# Patient Record
Sex: Female | Born: 1935 | Race: White | Hispanic: No | Smoking: Never smoker
Health system: Southern US, Community
[De-identification: ages and names within clinical notes are randomized; demographics above are authoritative.]

## PROBLEM LIST (undated history)

## (undated) DIAGNOSIS — N3281 Overactive bladder: Secondary | ICD-10-CM

## (undated) DIAGNOSIS — F419 Anxiety disorder, unspecified: Secondary | ICD-10-CM

## (undated) DIAGNOSIS — I10 Essential (primary) hypertension: Secondary | ICD-10-CM

## (undated) DIAGNOSIS — M1460 Charcot's joint, unspecified site: Secondary | ICD-10-CM

## (undated) HISTORY — DX: Overactive bladder: N32.81

---

## 2006-09-12 ENCOUNTER — Ambulatory Visit: Payer: Self-pay | Admitting: Cardiology

## 2007-01-16 ENCOUNTER — Ambulatory Visit: Payer: Self-pay | Admitting: Internal Medicine

## 2007-02-02 ENCOUNTER — Ambulatory Visit: Payer: Self-pay | Admitting: Internal Medicine

## 2007-03-09 ENCOUNTER — Ambulatory Visit: Payer: Self-pay | Admitting: Internal Medicine

## 2007-04-09 ENCOUNTER — Ambulatory Visit: Payer: Self-pay | Admitting: Internal Medicine

## 2007-05-01 ENCOUNTER — Ambulatory Visit: Payer: Self-pay | Admitting: Internal Medicine

## 2007-05-08 ENCOUNTER — Ambulatory Visit: Payer: Self-pay | Admitting: Internal Medicine

## 2007-05-29 ENCOUNTER — Ambulatory Visit: Payer: Self-pay | Admitting: Internal Medicine

## 2007-07-19 ENCOUNTER — Ambulatory Visit: Payer: Self-pay | Admitting: Internal Medicine

## 2007-07-19 DIAGNOSIS — J45909 Unspecified asthma, uncomplicated: Secondary | ICD-10-CM | POA: Insufficient documentation

## 2007-07-19 DIAGNOSIS — J209 Acute bronchitis, unspecified: Secondary | ICD-10-CM | POA: Insufficient documentation

## 2007-07-19 DIAGNOSIS — K219 Gastro-esophageal reflux disease without esophagitis: Secondary | ICD-10-CM | POA: Insufficient documentation

## 2007-07-28 ENCOUNTER — Encounter: Payer: Self-pay | Admitting: Internal Medicine

## 2007-08-06 ENCOUNTER — Ambulatory Visit: Payer: Self-pay | Admitting: Internal Medicine

## 2007-08-21 ENCOUNTER — Ambulatory Visit: Payer: Self-pay | Admitting: Internal Medicine

## 2007-08-31 ENCOUNTER — Telehealth (INDEPENDENT_AMBULATORY_CARE_PROVIDER_SITE_OTHER): Payer: Self-pay | Admitting: *Deleted

## 2007-10-30 ENCOUNTER — Telehealth (INDEPENDENT_AMBULATORY_CARE_PROVIDER_SITE_OTHER): Payer: Self-pay | Admitting: *Deleted

## 2007-12-08 ENCOUNTER — Emergency Department (HOSPITAL_COMMUNITY): Admission: EM | Admit: 2007-12-08 | Discharge: 2007-12-08 | Payer: Self-pay | Admitting: Emergency Medicine

## 2008-07-07 ENCOUNTER — Encounter: Payer: Self-pay | Admitting: Internal Medicine

## 2008-07-08 ENCOUNTER — Telehealth (INDEPENDENT_AMBULATORY_CARE_PROVIDER_SITE_OTHER): Payer: Self-pay | Admitting: *Deleted

## 2008-08-26 ENCOUNTER — Encounter: Payer: Self-pay | Admitting: Internal Medicine

## 2008-08-28 ENCOUNTER — Telehealth (INDEPENDENT_AMBULATORY_CARE_PROVIDER_SITE_OTHER): Payer: Self-pay | Admitting: *Deleted

## 2008-09-09 ENCOUNTER — Ambulatory Visit: Payer: Self-pay | Admitting: Internal Medicine

## 2008-09-23 ENCOUNTER — Ambulatory Visit: Payer: Self-pay | Admitting: Internal Medicine

## 2008-09-23 ENCOUNTER — Telehealth (INDEPENDENT_AMBULATORY_CARE_PROVIDER_SITE_OTHER): Payer: Self-pay | Admitting: *Deleted

## 2008-09-25 ENCOUNTER — Encounter: Payer: Self-pay | Admitting: Adult Health

## 2008-09-30 ENCOUNTER — Encounter: Payer: Self-pay | Admitting: Adult Health

## 2008-11-12 ENCOUNTER — Encounter: Payer: Self-pay | Admitting: Adult Health

## 2008-12-08 ENCOUNTER — Ambulatory Visit: Payer: Self-pay | Admitting: Internal Medicine

## 2009-01-09 ENCOUNTER — Inpatient Hospital Stay (HOSPITAL_COMMUNITY): Admission: EM | Admit: 2009-01-09 | Discharge: 2009-01-15 | Payer: Self-pay | Admitting: Emergency Medicine

## 2009-01-12 ENCOUNTER — Encounter: Payer: Self-pay | Admitting: Gastroenterology

## 2009-01-13 ENCOUNTER — Encounter: Payer: Self-pay | Admitting: Gastroenterology

## 2009-01-15 ENCOUNTER — Ambulatory Visit: Payer: Self-pay | Admitting: Gastroenterology

## 2009-03-28 ENCOUNTER — Emergency Department (HOSPITAL_COMMUNITY): Admission: EM | Admit: 2009-03-28 | Discharge: 2009-03-28 | Payer: Self-pay | Admitting: Emergency Medicine

## 2009-04-28 ENCOUNTER — Encounter: Payer: Self-pay | Admitting: Adult Health

## 2009-05-07 ENCOUNTER — Encounter: Payer: Self-pay | Admitting: Internal Medicine

## 2009-05-28 ENCOUNTER — Ambulatory Visit: Payer: Self-pay | Admitting: Internal Medicine

## 2009-06-23 ENCOUNTER — Encounter: Payer: Self-pay | Admitting: Internal Medicine

## 2009-07-09 ENCOUNTER — Telehealth (INDEPENDENT_AMBULATORY_CARE_PROVIDER_SITE_OTHER): Payer: Self-pay | Admitting: *Deleted

## 2009-07-10 ENCOUNTER — Encounter: Payer: Self-pay | Admitting: Internal Medicine

## 2009-07-17 ENCOUNTER — Telehealth (INDEPENDENT_AMBULATORY_CARE_PROVIDER_SITE_OTHER): Payer: Self-pay | Admitting: *Deleted

## 2009-07-17 ENCOUNTER — Encounter: Payer: Self-pay | Admitting: Internal Medicine

## 2009-10-08 ENCOUNTER — Encounter: Payer: Self-pay | Admitting: Adult Health

## 2009-10-13 ENCOUNTER — Encounter: Payer: Self-pay | Admitting: Adult Health

## 2009-10-13 ENCOUNTER — Telehealth (INDEPENDENT_AMBULATORY_CARE_PROVIDER_SITE_OTHER): Payer: Self-pay | Admitting: *Deleted

## 2009-10-16 ENCOUNTER — Encounter: Payer: Self-pay | Admitting: Adult Health

## 2009-10-28 ENCOUNTER — Telehealth: Payer: Self-pay | Admitting: Internal Medicine

## 2009-10-28 ENCOUNTER — Emergency Department (HOSPITAL_COMMUNITY): Admission: EM | Admit: 2009-10-28 | Discharge: 2009-10-28 | Payer: Self-pay | Admitting: Emergency Medicine

## 2010-03-09 ENCOUNTER — Inpatient Hospital Stay (HOSPITAL_COMMUNITY): Admission: EM | Admit: 2010-03-09 | Discharge: 2010-03-12 | Payer: Self-pay | Admitting: Emergency Medicine

## 2010-03-09 ENCOUNTER — Ambulatory Visit: Payer: Self-pay | Admitting: Cardiovascular Disease

## 2010-03-10 ENCOUNTER — Encounter (INDEPENDENT_AMBULATORY_CARE_PROVIDER_SITE_OTHER): Payer: Self-pay | Admitting: Internal Medicine

## 2010-03-10 ENCOUNTER — Ambulatory Visit: Payer: Self-pay | Admitting: Vascular Surgery

## 2010-03-11 ENCOUNTER — Encounter (INDEPENDENT_AMBULATORY_CARE_PROVIDER_SITE_OTHER): Payer: Self-pay | Admitting: Internal Medicine

## 2010-03-12 ENCOUNTER — Encounter (INDEPENDENT_AMBULATORY_CARE_PROVIDER_SITE_OTHER): Payer: Self-pay | Admitting: Internal Medicine

## 2010-07-27 NOTE — Procedures (Signed)
Summary: Pulse Oximetry/IDS  Pulse Oximetry/IDS   Imported By: Sherian Rein 07/24/2009 14:59:34  _____________________________________________________________________  External Attachment:    Type:   Image     Comment:   External Document

## 2010-07-27 NOTE — Medication Information (Signed)
Summary: Albuterol & Brovana/[facility]  Albuterol & Brovana/[facility]   Imported By: Sherian Rein 10/15/2009 11:11:49  _____________________________________________________________________  External Attachment:    Type:   Image     Comment:   External Document

## 2010-07-27 NOTE — Medication Information (Signed)
Summary: Albuterol & Brovana/[facility]  Albuterol & Brovana/[facility]   Imported By: Sherian Rein 10/13/2009 10:18:05  _____________________________________________________________________  External Attachment:    Type:   Image     Comment:   External Document

## 2010-07-27 NOTE — Medication Information (Signed)
Summary: Budesonide/[facility]  Budesonide/[facility]   Imported By: Sherian Rein 10/13/2009 10:16:39  _____________________________________________________________________  External Attachment:    Type:   Image     Comment:   External Document

## 2010-07-27 NOTE — Progress Notes (Signed)
Summary: going to ER for SOB  Phone Note Call from Patient Call back at [phone]   Caller: Patient Call For: Osie Amparo Summary of Call: pt at [facility] long emergencyroom  for breathing problem Initial call taken by: Rickard Patience,  Oct 28, 2009 3:18 PM  Follow-up for Phone Call        called spoke with patient's son who stated that pt is already at the ER and that he is on his way.  advised that the ER staff will notify our physician(s) on call and she will be rounded on by someone from the office.  son verbalized his understanding.  will forward to MW as Lorain Childes. Follow-up by: Boone Master CNA,  Oct 28, 2009 3:35 PM  Additional Follow-up for Phone Call Additional follow up Details #1::        aware Additional Follow-up by: Nyoka Cowden MD,  Oct 28, 2009 3:45 PM

## 2010-07-27 NOTE — Procedures (Signed)
Summary: Order for Overnight Pulse Oximetry/IDS  Order for Overnight Pulse Oximetry/IDS   Imported By: Sherian Rein 10/15/2009 11:10:10  _____________________________________________________________________  External Attachment:    Type:   Image     Comment:   External Document

## 2010-07-27 NOTE — Miscellaneous (Signed)
Summary: Orders Update  Clinical Lists Changes  Orders: Added new Referral order of DME Referral (DME) - Signed 

## 2010-07-27 NOTE — Procedures (Signed)
Summary: Order ONO/IDS  Order ONO/IDS   Imported By: Lester [place] 06/30/2009 09:51:49  _____________________________________________________________________  External Attachment:    Type:   Image     Comment:   External Document

## 2010-07-27 NOTE — Procedures (Signed)
Summary: ONO/[facility]  ONO/[facility]   Imported By: Lester [place] 06/30/2009 10:02:58  _____________________________________________________________________  External Attachment:    Type:   Image     Comment:   External Document

## 2010-07-27 NOTE — Procedures (Signed)
Summary: Pulse Oximetry/IDS  Pulse Oximetry/IDS   Imported By: Sherian Rein 10/26/2009 08:07:35  _____________________________________________________________________  External Attachment:    Type:   Image     Comment:   External Document

## 2010-07-27 NOTE — Progress Notes (Signed)
Summary: PRESCRIPT  Phone Note Call from Patient   Caller: Spouse Call For: PARRETT Summary of Call: PT RECEIVED A LETTER FROM [employer] ABOUT AVAPRO  Initial call taken by: Rickard Patience,  July 09, 2009 10:08 AM  Follow-up for Phone Call        called spoke with pt's spouse.  he states they received a letter from ins company stating pt's avapro is no longer covered w/o PA.  asked pt's husband if he would rather [place] initiate a PA which may take up to 2 weeks or for [place] to go ahead and find another medication to switch pt to.  he stated that she prefers to stay on the avapro.  informed Mr. Littleton that i will call their pharmacy to get them to fax [place] the information so that we may begin the PA process.  pt's husband verbalized his understanding.  pt was given a 30day supply of her medication until this is taken care per her husband.  called pharmacy.  was told they will fax [place] the appropriate info for the PA.  will sign off on message and await the fax.  Follow-up by: Boone Master CNA,  July 09, 2009 10:38 AM

## 2010-07-27 NOTE — Progress Notes (Signed)
Summary: medication  Phone Note From Pharmacy Call back at [phone] ex [phone]   Caller: [employer]  sonya Call For: wert  Summary of Call: need to know if avapro has been changed to diovan Initial call taken by: Rickard Patience,  July 17, 2009 9:34 AM  Follow-up for Phone Call        Done. Spoke with pt and made aware that rx for avapro was switched to diovan. Follow-up by: Vernie Murders,  July 17, 2009 9:50 AM

## 2010-07-27 NOTE — Progress Notes (Signed)
Summary: med directions  Phone Note From Pharmacy Call back at [phone] ex [phone]   Caller: [facility]  ( [facility] Call For: parrett  Summary of Call: have questions about albuterol directions Initial call taken by: Rickard Patience,  October 13, 2009 11:18 AM  Follow-up for Phone Call        Spoke with Halina Maidens from [facility].  They received Tammy's rx for pt's Albuterol ( see scanned document on 10-08-2009) However, order for Albuterol was changed from 1 daily as needed to 1 vial in nebulizer every 4 hours as needed for wheezing.  Halina Maidens states since pt is on Brovana two times a day her insurance company will only cover # 30 albuterol per month.  OK'd directions for albuterol to switch back to 1 vial once daily as needed.  Aundra Millet Reynolds LPN  October 13, 2009 11:35 AM   updated pt's med list.    New/Updated Medications: ALBUTEROL SULFATE (2.5 MG/3ML) 0.083% NEBU (ALBUTEROL SULFATE) 1 vial in nebulizer once daily as needed for wheezing

## 2010-09-09 LAB — URINALYSIS, ROUTINE W REFLEX MICROSCOPIC
Glucose, UA: NEGATIVE mg/dL
Ketones, ur: 40 mg/dL — AB
Leukocytes, UA: NEGATIVE
Nitrite: NEGATIVE
Protein, ur: 30 mg/dL — AB
Urobilinogen, UA: 1 mg/dL (ref 0.0–1.0)

## 2010-09-09 LAB — CBC
HCT: 37.5 % (ref 36.0–46.0)
MCH: 29.8 pg (ref 26.0–34.0)
MCV: 90.8 fL (ref 78.0–100.0)
Platelets: 221 10*3/uL (ref 150–400)
RDW: 12.8 % (ref 11.5–15.5)

## 2010-09-09 LAB — HEMOGLOBIN A1C
Hgb A1c MFr Bld: 5.2 % (ref <5.7)
Mean Plasma Glucose: 103 mg/dL (ref <117)

## 2010-09-09 LAB — BASIC METABOLIC PANEL
BUN: 18 mg/dL (ref 6–23)
BUN: 20 mg/dL (ref 6–23)
CO2: 24 mEq/L (ref 19–32)
CO2: 25 mEq/L (ref 19–32)
Chloride: 105 mEq/L (ref 96–112)
Chloride: 113 mEq/L — ABNORMAL HIGH (ref 96–112)
Creatinine, Ser: 1.05 mg/dL (ref 0.4–1.2)
Glucose, Bld: 102 mg/dL — ABNORMAL HIGH (ref 70–99)
Glucose, Bld: 95 mg/dL (ref 70–99)
Potassium: 3.2 mEq/L — ABNORMAL LOW (ref 3.5–5.1)

## 2010-09-09 LAB — DIFFERENTIAL
Basophils Absolute: 0 10*3/uL (ref 0.0–0.1)
Basophils Relative: 0 % (ref 0–1)
Lymphocytes Relative: 9 % — ABNORMAL LOW (ref 12–46)
Monocytes Absolute: 0.2 10*3/uL (ref 0.1–1.0)
Neutro Abs: 6.8 10*3/uL (ref 1.7–7.7)
Neutrophils Relative %: 88 % — ABNORMAL HIGH (ref 43–77)

## 2010-09-09 LAB — URINE MICROSCOPIC-ADD ON

## 2010-09-09 LAB — COMPREHENSIVE METABOLIC PANEL
Albumin: 3.6 g/dL (ref 3.5–5.2)
BUN: 17 mg/dL (ref 6–23)
Creatinine, Ser: 1.1 mg/dL (ref 0.4–1.2)
Glucose, Bld: 108 mg/dL — ABNORMAL HIGH (ref 70–99)
Total Bilirubin: 0.7 mg/dL (ref 0.3–1.2)
Total Protein: 6.2 g/dL (ref 6.0–8.3)

## 2010-09-09 LAB — TROPONIN I: Troponin I: 0.01 ng/mL (ref 0.00–0.06)

## 2010-09-09 LAB — CK TOTAL AND CKMB (NOT AT ARMC)
CK, MB: 6.9 ng/mL (ref 0.3–4.0)
Relative Index: 3.5 — ABNORMAL HIGH (ref 0.0–2.5)

## 2010-09-09 LAB — APTT: aPTT: 26 seconds (ref 24–37)

## 2010-09-09 LAB — LACTIC ACID, PLASMA: Lactic Acid, Venous: 1.2 mmol/L (ref 0.5–2.2)

## 2010-09-09 LAB — PROTIME-INR: INR: 0.97 (ref 0.00–1.49)

## 2010-09-09 LAB — LIPID PANEL: VLDL: 13 mg/dL (ref 0–40)

## 2010-09-14 LAB — DIFFERENTIAL
Basophils Relative: 0 % (ref 0–1)
Eosinophils Absolute: 0.2 10*3/uL (ref 0.0–0.7)
Lymphs Abs: 0.3 10*3/uL — ABNORMAL LOW (ref 0.7–4.0)
Monocytes Absolute: 0.1 10*3/uL (ref 0.1–1.0)
Monocytes Relative: 1 % — ABNORMAL LOW (ref 3–12)
Neutrophils Relative %: 91 % — ABNORMAL HIGH (ref 43–77)

## 2010-09-14 LAB — BASIC METABOLIC PANEL
CO2: 28 mEq/L (ref 19–32)
Chloride: 106 mEq/L (ref 96–112)
Creatinine, Ser: 1.07 mg/dL (ref 0.4–1.2)
GFR calc Af Amer: 59 mL/min — ABNORMAL LOW (ref >60)
Potassium: 3.8 mEq/L (ref 3.5–5.1)
Sodium: 140 mEq/L (ref 135–145)

## 2010-09-14 LAB — CBC
Hemoglobin: 12.8 g/dL (ref 12.0–15.0)
MCHC: 33.7 g/dL (ref 30.0–36.0)
MCV: 88.2 fL (ref 78.0–100.0)
RBC: 4.29 MIL/uL (ref 3.87–5.11)
WBC: 7.1 10*3/uL (ref 4.0–10.5)

## 2010-10-03 LAB — DIFFERENTIAL
Blasts: 0 %
Eosinophils Absolute: 0.2 10*3/uL (ref 0.0–0.7)
Eosinophils Relative: 2 % (ref 0–5)
Lymphocytes Relative: 7 % — ABNORMAL LOW (ref 12–46)
Lymphs Abs: 0.9 10*3/uL (ref 0.7–4.0)
Metamyelocytes Relative: 0 %
Monocytes Absolute: 0.4 10*3/uL (ref 0.1–1.0)
Monocytes Relative: 3 % (ref 3–12)
Neutro Abs: 10.9 10*3/uL — ABNORMAL HIGH (ref 1.7–7.7)
Neutrophils Relative %: 88 % — ABNORMAL HIGH (ref 43–77)
nRBC: 0 /100 WBC

## 2010-10-03 LAB — BASIC METABOLIC PANEL
BUN: 11 mg/dL (ref 6–23)
BUN: 15 mg/dL (ref 6–23)
BUN: 15 mg/dL (ref 6–23)
BUN: 32 mg/dL — ABNORMAL HIGH (ref 6–23)
CO2: 22 mEq/L (ref 19–32)
CO2: 23 mEq/L (ref 19–32)
CO2: 24 mEq/L (ref 19–32)
CO2: 26 mEq/L (ref 19–32)
Calcium: 7.9 mg/dL — ABNORMAL LOW (ref 8.4–10.5)
Calcium: 8 mg/dL — ABNORMAL LOW (ref 8.4–10.5)
Chloride: 108 mEq/L (ref 96–112)
Chloride: 113 mEq/L — ABNORMAL HIGH (ref 96–112)
Chloride: 113 mEq/L — ABNORMAL HIGH (ref 96–112)
Creatinine, Ser: 1.21 mg/dL — ABNORMAL HIGH (ref 0.4–1.2)
Creatinine, Ser: 1.63 mg/dL — ABNORMAL HIGH (ref 0.4–1.2)
GFR calc Af Amer: 37 mL/min — ABNORMAL LOW (ref >60)
GFR calc Af Amer: 52 mL/min — ABNORMAL LOW (ref >60)
GFR calc non Af Amer: 44 mL/min — ABNORMAL LOW (ref >60)
Glucose, Bld: 85 mg/dL (ref 70–99)
Glucose, Bld: 87 mg/dL (ref 70–99)
Glucose, Bld: 87 mg/dL (ref 70–99)
Glucose, Bld: 95 mg/dL (ref 70–99)
Potassium: 3.4 mEq/L — ABNORMAL LOW (ref 3.5–5.1)
Potassium: 3.7 mEq/L (ref 3.5–5.1)
Potassium: 4.3 mEq/L (ref 3.5–5.1)
Sodium: 142 mEq/L (ref 135–145)

## 2010-10-03 LAB — URINE CULTURE

## 2010-10-03 LAB — ABO/RH: ABO/RH(D): A POS

## 2010-10-03 LAB — PROTIME-INR
INR: 1.2 (ref 0.00–1.49)
Prothrombin Time: 15.6 seconds — ABNORMAL HIGH (ref 11.6–15.2)

## 2010-10-03 LAB — CBC
HCT: 23.1 % — ABNORMAL LOW (ref 36.0–46.0)
HCT: 25.3 % — ABNORMAL LOW (ref 36.0–46.0)
HCT: 27.2 % — ABNORMAL LOW (ref 36.0–46.0)
HCT: 28.1 % — ABNORMAL LOW (ref 36.0–46.0)
Hemoglobin: 7.4 g/dL — CL (ref 12.0–15.0)
Hemoglobin: 8.4 g/dL — ABNORMAL LOW (ref 12.0–15.0)
MCHC: 32.9 g/dL (ref 30.0–36.0)
MCHC: 33 g/dL (ref 30.0–36.0)
MCHC: 33.1 g/dL (ref 30.0–36.0)
MCV: 72.8 fL — ABNORMAL LOW (ref 78.0–100.0)
MCV: 73 fL — ABNORMAL LOW (ref 78.0–100.0)
MCV: 73.3 fL — ABNORMAL LOW (ref 78.0–100.0)
Platelets: 132 10*3/uL — ABNORMAL LOW (ref 150–400)
Platelets: 143 10*3/uL — ABNORMAL LOW (ref 150–400)
Platelets: 152 10*3/uL (ref 150–400)
Platelets: 177 10*3/uL (ref 150–400)
RBC: 3.37 MIL/uL — ABNORMAL LOW (ref 3.87–5.11)
RBC: 3.67 MIL/uL — ABNORMAL LOW (ref 3.87–5.11)
RBC: 3.91 MIL/uL (ref 3.87–5.11)
RDW: 18.2 % — ABNORMAL HIGH (ref 11.5–15.5)
RDW: 21.1 % — ABNORMAL HIGH (ref 11.5–15.5)
RDW: 21.4 % — ABNORMAL HIGH (ref 11.5–15.5)
RDW: 21.7 % — ABNORMAL HIGH (ref 11.5–15.5)
RDW: 22.9 % — ABNORMAL HIGH (ref 11.5–15.5)
WBC: 9.5 10*3/uL (ref 4.0–10.5)

## 2010-10-03 LAB — CROSSMATCH: Antibody Screen: NEGATIVE

## 2010-10-03 LAB — RETICULOCYTES
RBC.: 3.44 MIL/uL — ABNORMAL LOW (ref 3.87–5.11)
Retic Count, Absolute: 20.6 10*3/uL (ref 19.0–186.0)
Retic Ct Pct: 0.6 % (ref 0.4–3.1)

## 2010-10-03 LAB — UIFE/LIGHT CHAINS/TP QN, 24-HR UR
Alpha 1, Urine: DETECTED — AB
Alpha 2, Urine: DETECTED — AB
Alpha 2, Urine: DETECTED — AB
Beta, Urine: DETECTED — AB
Free Kappa Lt Chains,Ur: 12.2 mg/dL — ABNORMAL HIGH (ref 0.04–1.51)
Free Kappa/Lambda Ratio: 2.8 ratio (ref 0.46–4.00)
Free Lambda Lt Chains,Ur: 4.11 mg/dL — ABNORMAL HIGH (ref 0.08–1.01)
Free Lt Chn Excr Rate: 161 mg/d
Gamma Globulin, Urine: DETECTED — AB
Total Protein, Urine-Ur/day: 374 mg/d — ABNORMAL HIGH (ref 10–140)
Total Protein, Urine: 26.7 mg/dL
Volume, Urine: 1400 mL

## 2010-10-03 LAB — POCT I-STAT, CHEM 8
Calcium, Ion: 1.14 mmol/L (ref 1.12–1.32)
Creatinine, Ser: 2.1 mg/dL — ABNORMAL HIGH (ref 0.4–1.2)
Glucose, Bld: 80 mg/dL (ref 70–99)
HCT: 24 % — ABNORMAL LOW (ref 36.0–46.0)
Hemoglobin: 8.2 g/dL — ABNORMAL LOW (ref 12.0–15.0)
Potassium: 3.9 mEq/L (ref 3.5–5.1)
TCO2: 22 mmol/L (ref 0–100)

## 2010-10-03 LAB — URINALYSIS, ROUTINE W REFLEX MICROSCOPIC
Hgb urine dipstick: NEGATIVE
Nitrite: NEGATIVE
Specific Gravity, Urine: 1.019 (ref 1.005–1.030)
pH: 5.5 (ref 5.0–8.0)

## 2010-10-03 LAB — FERRITIN: Ferritin: 25 ng/mL (ref 10–291)

## 2010-10-03 LAB — RPR: RPR Ser Ql: NONREACTIVE

## 2010-10-03 LAB — HEPATIC FUNCTION PANEL
ALT: 22 U/L (ref 0–35)
AST: 25 U/L (ref 0–37)
Albumin: 2.4 g/dL — ABNORMAL LOW (ref 3.5–5.2)
Total Bilirubin: 0.4 mg/dL (ref 0.3–1.2)

## 2010-10-03 LAB — LIPID PANEL
Cholesterol: 89 mg/dL (ref 0–200)
LDL Cholesterol: 41 mg/dL (ref 0–99)
Triglycerides: 106 mg/dL (ref <150)

## 2010-10-03 LAB — URINE MICROSCOPIC-ADD ON

## 2010-10-03 LAB — T4, FREE: Free T4: 0.95 ng/dL (ref 0.80–1.80)

## 2010-10-03 LAB — CARDIAC PANEL(CRET KIN+CKTOT+MB+TROPI)
CK, MB: 11.2 ng/mL — ABNORMAL HIGH (ref 0.3–4.0)
Relative Index: INVALID (ref 0.0–2.5)
Total CK: 58 U/L (ref 7–177)

## 2010-10-03 LAB — IMMUNOFIXATION ELECTROPHORESIS
IgM, Serum: 68 mg/dL (ref 60–263)
Total Protein ELP: 4.3 g/dL — ABNORMAL LOW (ref 6.0–8.3)

## 2010-10-03 LAB — SEDIMENTATION RATE: Sed Rate: 48 mm/hr — ABNORMAL HIGH (ref 0–22)

## 2010-10-03 LAB — MAGNESIUM: Magnesium: 2.3 mg/dL (ref 1.5–2.5)

## 2010-10-03 LAB — FOLATE: Folate: >20 ng/mL

## 2010-10-03 LAB — OSMOLALITY: Osmolality: 291 mOsm/kg (ref 275–300)

## 2010-10-03 LAB — TSH: TSH: 1.283 u[IU]/mL (ref 0.350–4.500)

## 2010-11-09 NOTE — Assessment & Plan Note (Signed)
HEALTHCARE                             PULMONARY OFFICE NOTE   [name], [name]                      MRN:          [id number]  DATE:02/02/2007                            DOB:          [birth year]    This is a most pleasant 77 year old white female, seen initially on July  22 for dyspnea associated with hoarseness, but had not responded to  prednisone, and I thought was probably related to irritation from  inhalers plus reflux.  I asked her to stop all of her inhalers, stop  Boniva (which potentially could contribute to reflux), and treated her  aggressively for reflux while using DuoNeb q.6 h. p.r.n.  She found  within 3 days she was better and has not used DuoNeb since.  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   PHYSICAL EXAMINATION:  She is a pleasant, ambulatory white female in no  acute distress with very weathered facies.  She is afebrile with normal vital signs.  HEENT:  Unremarkable.  Oropharynx is clear.  There is no evidence of  excessive postnasal drainage or cobblestoning.  NECK:  Supple without cervical adenopathy or tenderness.  Trachea is  midline, no thyromegaly.  Lung fields are perfectly clear bilaterally to auscultation and  percussion.  There was no cough elicited on inspiratory or expiratory  maneuvers.  There was mild kyphosis present.  HEART:  Regular rhythm without murmurs, gallops, or rubs or increase in  P2.  ABDOMEN:  Soft, benign.  EXTREMITIES:  Warm without calf tenderness, cyanosis, clubbing, or  edema.   Chest x-ray was reviewed from her last visit and suggested emphysema.   IMPRESSION:  Although she carries a diagnosis of chronic obstructive  pulmonary disease, and on chest x-ray it has emphysema, she says she has  never smoked but been around it all of her life, except for the last 3  years.  I suspect she does, indeed, have some  chronic obstructive  pulmonary disease , but most of her symptoms were upper and not lower  airway in nature.  Unfortunately, most medications for chronic  obstructive pulmonary disease can aggravate the upper airway, either  directly or by contributing to reflux.   I, therefore, recommended the following:  1. For now she should stay off Boniva, although long term,  we will      need to either rechallenge her with [facility] or consider an      alternative agent for the treatment of osteoporosis, should, on      rechallenge, we find that every time the Sandrea Hammond is started, she has      a COPD flare.  2. Continue b.i.d. dosing of her Protonix for at least the next 4      weeks before returning here for PFTs to answer the question whether      or not she has significant functional abnormalities to warrant more      aggressive long-term treatment.  3. Stop Singular since she is not convinced it  helped and is already      on an extensive and expensive regimen.     [name]. Alexandra Sires, MD, [facility]  Electronically Signed    MBW/MedQ  DD: 02/02/2007  DT: 02/03/2007  Job #: [id number]   cc:   [name]

## 2010-11-09 NOTE — Assessment & Plan Note (Signed)
[facility]                             PULMONARY OFFICE NOTE   MADASON, RAULS                      MRN:          [id number]  DATE:05/29/2007                            DOB:          07-14-1924    [name] is an 77 year old white female who returns all smiles  today feeling the best she has felt in months.  She is tapering off of  prednisone.  Has been taking Symbicort 80/4.5 2 puffs b.i.d. with no  need for rescue therapy and tolerating normal activities of daily living  as well as sleeping well at night with no coughing or wheezing.   I have reviewed her medications with her and using a medication calendar  format that was generated by our nurse practitioner and provides a user  friendly unambiguous self-administration medication record that she has  been following to the letter.   PHYSICAL EXAMINATION:  GENERAL:  She is a pleasant ambulatory white  female with weathered facies.  VITAL SIGNS:  Stable.  HEENT:  Unremarkable.  Oropharynx clear.  LUNGS:  Lung fields are perfectly clear bilaterally to auscultation and  percussion.  There is regular rhythm without murmur, gallop or rub.  ABDOMEN:  Soft, benign.  EXTREMITIES:  Warm without calf tenderness, cyanosis, clubbing or edema.   LABORATORY DATA:  Heme saturation 98% on room air.   IMPRESSION:  This patient has features of chronic asthma of relatively  late onset in life typical of a patient who might be refluxing.  This is  the one change in her regimen that had not been tried before, and I have  asked therefore to continue the Zegerid for now, and unless she is  completely staying free of wheezing and free of exacerbations off of  systemic steroids I would not stop the Zegerid.   It is critical that she understand how to use Symbicort effectively, and  I spent extra time making sure she could self-administer this  effectively.  If she flares off of prednisone I would like to see  her  back here.  Otherwise, follow up and refills can certainly be through  Dr. Waynard Reeds office.     [name]. [name], MD, [facility]  Electronically Signed    MBW/MedQ  DD: 05/29/2007  DT: 05/30/2007  Job #: [id number]   cc:   Dr. Virgina Organ

## 2010-11-09 NOTE — Assessment & Plan Note (Signed)
[facility]                             PULMONARY OFFICE NOTE   [name], [name]                      MRN:          [id number]  DATE:05/01/2007                            DOB:          [birth year]    HISTORY:  An 77 year old white female with a history of what I  considered intermittent asthma, but never smoker with a history of  intermittent asthma with a baseline FEV1 of 85% predicted documented in  September of 2008.  However, she has been having more bad days than good  days in the last 4 weeks, and comes in today while on a tapering course  of prednisone with another flare-up of increasing dyspnea with minimal  activity, as well as nocturnal spells which require albuterol and  Atrovent per nebulizer (at baseline she has no difficulty with dyspnea  or any need for nebulizer therapy.  She was given Symbicort by Dr.  Virgina Organ on April 26, 2007 and states she is no better yet.  She  denies any fevers, chills, sweats, orthopnea, PND, leg swelling or chest  pain.   MEDICATIONS:  For full inventory of medications, please see face sheet  dated May 01, 2007, noting that she continues to use Zegerid 40 mg  at bedtime.   PHYSICAL EXAMINATION:  GENERAL:  She is an anxious but pleasant  ambulatory white female with weathered facies.  VITAL SIGNS:  Stable.  HEENT:  Unremarkable.  Oropharynx clear.  LUNGS:  Lung fields reveal inspiratory and expiratory wheezes and  rhonchi bilaterally with poor air movement.  Regular rhythm without  murmur, gallop, or rub.  ABDOMEN:  Soft, benign.  EXTREMITIES:  Warm without calf tenderness.  No clubbing, cyanosis, or  edema.   IMPRESSION:  This patient has much more chronic asthma than I previously  appreciated.  It may well be that the reason it is not obvious is that  she over-uses her home nebulizer, and also is frequently receiving  courses of prednisone, which make her look better than she really is.  Today, even on prednisone, she really has very poor airways control, and  therefore I recommended the following:  1. First I spent extra time making sure that she could use M.D.I.      effectively, and to my surprise I could only coach her to about 25%      effectiveness.  She wants to give it a try, so I am going to ask      her to continue Symbicort at the course of 160/4.5 two puffs b.i.d.      for the next week and give her prednisone 10 mg tablets to take 2      q.a.m. until 100% better, and then one q.a.m. thereafter until we      have a chance to make sure that she can use M.D.I. effectively.  If      not, I plan to switch her over to budesonide and Brovana on her      next visit.   In the meantime, I am going to ask her to continue Singulair 10 mg  daily  (although I believe long-term this is probably not going to be needed,  as she is breaking right through the Singulair, as well), and take the  liberty of switching her from Cozaar to Avapro 150 one daily (because  anecdotally I found that Cozaar causes more problems with airways than  any of the other ARBs, which generally are very safe from a pulmonary  perspective).     [name]. [name], MD, [facility]  Electronically Signed    MBW/MedQ  DD: 05/01/2007  DT: 05/02/2007  Job #: [id number]   cc:   Prescott Parma

## 2010-11-09 NOTE — Discharge Summary (Signed)
Alexandra, Montoya             ACCOUNT NO.:  [id number]   MEDICAL RECORD NO.:  [id number]          PATIENT TYPE:  INP   LOCATION:  1411                         FACILITY[facility]  [facility]   PHYSICIAN:  Hillery Aldo, M.D.   DATE OF BIRTH:  08-Mar-1925   DATE OF ADMISSION:  01/09/2009  DATE OF DISCHARGE:                               DISCHARGE SUMMARY   Dictation ended at this point.      Hillery Aldo, M.D.  Electronically Signed     CR/MEDQ  D:  01/13/2009  T:  01/13/2009  Job:  [id number]

## 2010-11-09 NOTE — Consult Note (Signed)
[name], TRAUTMANN NO.:  [id number]   MEDICAL RECORD NO.:  [id number]          PATIENT TYPE:  INP   LOCATION:  1411                         FACILITY:  [facility]   PHYSICIAN:  Marlan Palau, M.D.  DATE OF BIRTH:  01/18/1925   DATE OF CONSULTATION:  01/12/2009  DATE OF DISCHARGE:                                 CONSULTATION   HISTORY OF PRESENT ILLNESS:  Assunta Pupo is an 77 year old left-  handed white female born June 20, 1925 with a history of.  Chronic  gait disturbance that has been present for over 2 years.  The patient  has had worsening of her ability to ambulate over the last week or so  with tendency to fall left.  The patient also reports some discomfort in  the left eye and has had closing of the eyelid on the left, and this has  been interpreted as being ptosis.  The patient was admitted for  evaluation of a possible stroke event, but an MRI scan of the brain  showed no acute stroke with minimal small vessel ischemic changes that  are chronic.  MRI angiogram was relatively unremarkable.  The patient  denies double vision.  Denies new numbness or weakness on the face, arms  or legs.  The patient was noted to have acute renal failure with  improvement in the BUN and creatinine during this hospitalization.  The  patient had evidence of a urinary tract infection on admission.  The  patient has anemia and has been running hemoglobins in the  7.49 to 9  range.  Neurology was asked to see this patient for further evaluation  because of the left-sided ptosis or eye closure.   PAST MEDICAL HISTORY:  1. History of left-sided ptosis versus squinting of the left eye.  2. Gait disorder, acute on chronic.  3. Bilateral cataract surgery.  4. Stress incontinence of the bladder.  5. Hypertension.  6. Minimal small vessel disease by MRI.  7. Acute renal failure.  8. Anemia.  9. Asthma.  10.Degenerative arthritis.  11.Urinary tract infection on this  admission.   The patient does not smoke or drink.   CURRENT MEDICATIONS:  1. Brovana 15 mcg inhaler daily.  2. Dulcolax 10 mg daily.  3. Pulmicort 0.5 mg inhaler every 12 hours.  4. Cipro 250 mg every 8 hours.  5 . Vitamin B-12 at 1000 mcg IM daily.  1. Gabapentin 300 mg twice daily.  2. Benicar 20 mg daily.  3. Protonix 40 mg daily.  4. Tylenol 650 mg every 4 hours if needed.  5. Oxycodone 5 mg every 4 hours if needed.   SOCIAL HISTORY:  This patient is married, lives in the [place], [place] area, is retired.  His three children who are alive and well.   FAMILY MEDICAL HISTORY:  It is notable that mother died with congestive  heart failure.  Father died with dementia.  The patient has two sisters,  and one brother who is still living.  One living sister has had a gait  disorder.  One brother died with cancer.  The  patient had two siblings  that died as infants, one with a diarrheal illness and one with food  poisoning.   REVIEW OF SYSTEMS:  Notable for no recent fevers, chills.  The patient  denies headache or significant neck pain.  The patient denies shortness  of breath, chest pains, abdominal pain, nausea, vomiting.  Does have  some trouble with urinary incontinence.  Denies any numbness or weakness  on the arms or legs, trouble swallowing, double vision.  The patient has  not any blackout episodes or seizures.   PHYSICAL EXAMINATION:  VITALS:  Blood pressure is 168/85, heart rate 72,  respiratory rate 18, temperature afebrile.  GENERAL:  This patient is a fairly well-developed elderly white female  who is alert and cooperative at the time of examination.  HEENT:  Head is atraumatic.  Eyes - pupils are postsurgical, round,  reactive to light.  Disks are flat bilaterally.  NECK:  Supple.  No carotid bruits noted.  RESPIRATORY:  Clear.  CARDIOVASCULAR:  Regular rate and rhythm.  No obvious murmurs or rubs  noted.  EXTREMITIES:  Without significant edema.   NEUROLOGIC:  Cranial nerves as above.  Facial symmetry is present.  The  patient has full extraocular movements.  Visual fields are full.  No  ptosis is seen on primary gaze.  At times, the patient will actively  squint in the left eye down.  With superior gaze, there appears be some  slight divergence of the gaze with exotropia of the right eye, but the  patient does not report double vision.  Patient has good strength in all  fours.  Good symmetric motor tone is noted throughout.  Sensory testing  is intact to pinprick, soft touch, vibratory sensation throughout.  The  patient has good finger-nose-finger, heel-to-shin bilaterally.  Gait was  not tested.  Deep tendon reflexes reveal depressed reflexes bilaterally,  but they are symmetric.  The patient's toes are neutral bilaterally.  Again, no drift to the arms or legs noted.  With arms outstretched for 1  minute and superior deviation of the eyes for 1 minute, there is no  fatigable weakness of the proximal arms or evidence of ptosis or  worsening divergence of gaze noted on either side.   LABORATORY DATA:  Notable for a sodium of 137, potassium 3.4, chloride  of 112, CO2 of 23, glucose 87, BUN of 15, creatinine of 1.17, calcium  7.9.  CEA of 0.6.  Homocystine 19.6.  Urinalysis reveals specific  gravity of 0.019, pH of 5.5, protein 30 mg/dL, 57-84 white cells in the  urine.  RPR nonreactive.  B-12 level of 216.  TSH of 1.283, alkaline  phosphatase of 137, SGOT of 25, SGPT of 22, total protein 5.4, albumin  2.4.  Admission BUN and creatinine are 53 and 2.1 respectively.  White  count of 12.4, hemoglobin of 7.4 on admission, hematocrit of 23.1, MCV  of 68.6, platelets of 147.  INR of 1.2.  Iron levels less than 10.   MRI of the head is as above.   IMPRESSION:  1. Reports of left eye discomfort and squinting of the left eye.  No      true ptosis is seen.  2. Gait disorder, acute on chronic.  3. Minimal small vessel disease by MRI.  4.  Acute renal failure.  5. Iron-deficiency anemia.   This patient complains of left eye discomfort and is squinting the left  eye actively, contracting the eyelid rather than having true ptosis  or  fatigable weakness of the left eye.  The patient reports no double  vision.  I suspect that this patient has an intraocular pathology  causing her to squint, although the patient does not seem to be aware  that she is doing this and does not report true photophobia.  The  patient repetitively denies double vision, although with  superior gaze, there does appear to be some divergence to gaze.  Given  the presentation, I will go on to check acetylcholine receptor  antibodies, check a sed rate.  The patient will need ophthalmologic  evaluation by Dr. Elmer Picker once she is discharged from the hospital.  I  will follow this patient's progress while in-house.      Marlan Palau, M.D.  Electronically Signed     CKW/MEDQ  D:  01/12/2009  T:  01/12/2009  Job:  [id number]   cc:   Delon Sacramento, M.D.  Fax: [phone]   [facility]  [address], [address]

## 2010-11-09 NOTE — Discharge Summary (Signed)
[name], [name]             ACCOUNT NO.:  [id number]   MEDICAL RECORD NO.:  [id number]          PATIENT TYPE:  INP   LOCATION:  1411                         FACILITY:  [facility]   PHYSICIAN:  Zannie Cove, MD     DATE OF BIRTH:  Nov 20, 1924   DATE OF ADMISSION:  01/09/2009  DATE OF DISCHARGE:                               DISCHARGE SUMMARY   PRIMARY CARE PHYSICIAN:  [facility].  The  patient is due to get a new physician there.   Discharge diagnosis, consultations, admission details, procedures and  course as dictated by Dr. Hillery Aldo.   HOSPITAL COURSE:  1. In addition for her recurrent falls, her workup was negative and      thought to be secondary to deconditioning.  She will be discharged      home with a rolling walker and home PT/OT.  2. Left eye ptosis thought to be not a true ptosis by Dr. Anne Hahn from      Neurology.  He recommended follow up with Dr. Elmer Picker of      ophthalmology.  Her ptosis has improved, but still has occasional      left eye ptosis.  3. Microcytic anemia.  Started on ferrous sulfate, has iron      deficiency.  Capsule endoscopy done, results are still pending.      Patient is to be notified of results by [facility].  The EGD and      colonoscopy were unrevealing.  4. Acute renal failure, resolved.  Myeloma workup is negative.      Immunofixation was only polyclonal.  Did not show any evidence of      monoclonal spikes or Bence-Jones protein.  5. Escherichia coli urinary tract infection.  Completed a course of      ciprofloxacin.   FOLLOW UP:  1. Follow up with a new primary care physician at [facility].  2. Dr. Elmer Picker of ophthalmology.  3. [facility], Dr. Arlyce Dice.   CONDITION ON DISCHARGE:  Stable.   DISCHARGE DIET:  Low-sodium diet.   DISCHARGE ACTIVITY:  As tolerated with fall precautions per PT  recommendations.   LABORATORY DATA:  CBC:  Hemoglobin 8.4, hematocrit 25, white count  9.1,  platelets 177.  Chemistry:  Sodium 142, potassium 4.3, chloride 113,  bicarb 22, BUN 11, creatinine 1.1, glucose 95.   DISCHARGE MEDICATIONS:  1. Aspirin 325 mg once a day.  2. Gabapentin 300 b.i.d.  3. Omeprazole 20 mg b.i.d.  4. Budesonide nebs 0.5 mg once in the morning and once at night as      needed.  5. Brovana nebs 15 mcg inhalation once in the morning and at night if      needed.  6. Mucinex DM 1-2 tabs as needed.  7. Albuterol nebs 1-2 puffs every 4 hours as needed.  8. Avapro 150 mg once a day.  9. Ferrous sulfate 325 mg once a day.      Zannie Cove, MD  Electronically Signed     PJ/MEDQ  D:  01/15/2009  T:  01/15/2009  Job:  [id number]   cc:   [facility]  Fax: [phone]   Delon Sacramento, M.D.  Fax: [phone]   Barbette Hair. Arlyce Dice, MD,FACG  [address]  [place]  [place] [address]

## 2010-11-09 NOTE — Assessment & Plan Note (Signed)
[name]                             PULMONARY OFFICE NOTE   [name], [name]                      MRN:          [id number]  DATE:05/08/2007                            DOB:          14-Oct-1924    HISTORY OF PRESENT ILLNESS:  Patient is an 77 year old white female  patient of [name], who has a known history of intermittent asthma in a  never-smoker, presents today for a one-week followup.  Last visit,  patient was recommended to continue her Symbicort, which is a dose of  80/4.5 two puffs twice a day.  She was given prednisone 10 mg two  tablets to take until back to 100%.  Patient was changed over from  Cozaar to Avapro.  Patient returns today, reporting that she is  substantially improved.  Her wheezing has totally resolved and cough is  much better.  She denies any chest pain, shortness of breath, orthopnea,  PND.   Patient has brought her medications in today.  She does still maintain  on prednisone 20 mg.  She reports she is doing very well.   PAST MEDICAL HISTORY:  Reviewed.   CURRENT MEDICATIONS:  Reviewed.   PHYSICAL EXAM:  Patient is a pleasant female in no acute distress.  She is afebrile with stable vital signs.  Her O2 saturation is 94% on  room air.  HEENT:  Unremarkable.  NECK:  Supple without cervical adenopathy, no JVD.  LUNG SOUNDS:  Clear to auscultation without any wheezes or crackles.  CARDIAC:  Regular rate and rhythm.  ABDOMEN:  Soft and nontender.  EXTREMITIES:  Warm without any edema.   IMPRESSION AND PLAN:  1. Chronic asthma.  Patient is much improved on her present regimen.      Patient will continue on Symbicort 80/4.5 two puffs twice daily.      We will decrease prednisone down to 10 mg daily and hold and we      will discontinue Singulair.  She will return back here in two weeks      for followup with [name], or sooner if needed.  2. Complex medication regimen.  Patient's medications were reviewed in     detail.  Patient      education was provided.  A computerized medication calendar was      completed for this patient and reviewed in detail.      Alexandra Oaks, NP  Electronically Signed      [name]. Alexandra Sires, MD, [facility]  Electronically Signed   TP/MedQ  DD: 05/08/2007  DT: 05/09/2007  Job #: [id number] [id number]

## 2010-11-09 NOTE — Assessment & Plan Note (Signed)
[facility]                             PULMONARY OFFICE NOTE   CANYON, WILLOW                      MRN:          [id number]  DATE:03/09/2007                            DOB:          10/15/1924    PULMONARY SUMMARY FINAL OFFICE VISIT:   HISTORY:  Patient is a 77 year old white female, never-smoker, seen on  July 22 for dyspnea and hoarseness that had not responded to prednisone  or various inhalers that I asked her to stop.  I also asked her to stop  Boniva, because I thought she might be refluxing.  She is doing great  now, having only used one nebulizer for the last month with no  significant cough, hoarseness or dyspnea.  She returns with request for  PFTs.  She also denies any nocturnal symptoms.   She is maintaining at this point on Protonix 40 mg q.a.m., but has also  been off Singulair for over a month without any increased symptoms of  rhinitis or asthma.   PHYSICAL EXAMINATION:  She is a very pleasant, ambulatory, white female,  in no acute distress.  She is afebrile, stable vital signs.  HEENT: Significant for oropharynx that is clear and there is no  hoarseness at all on phonation.  She no longer clears her throat during  the interview or exam.  Nasal turbinates are normal with no  cobblestoning of the oropharynx.  LUNG FIELDS:  Perfectly clear bilaterally to auscultation and  percussion.  There is regular rhythm without murmur, gallop or rub.  ABDOMEN:  Soft, benign.  EXTREMITIES:  Warm without calf tenderness, cyanosis, clubbing.   Chest x-ray was reviewed from her last visit and suggests  hyperinflation.  However, PFTs today reveal minimum air flow  obstruction.  However, the minimum air flow obstruction does improve  after bronchodilators.   IMPRESSION:  This patient probably has more chronic asthma than COPD,  given the fact that she has never smoked.  The asthma may, in turn, be  exacerbated by rhinitis, either viral  or allergic, or by reflux.   I note that many of her symptoms may have started after starting Boniva  for osteoporosis and, therefore, I would be cautious that, if Boniva is  restarted, she be monitored carefully for flare-up of her asthma.   Because she does have reversible air flow obstruction, she does indeed  have asthma and, rather than use p.r.n. albuterol, I taught her to use  Symbicort, which I have recommended 80/4.5 two puffs b.i.d. p.r.n.  This  is an unusual way to use Symbicort and it is not approved in this  [place] for this purpose, but is used in [place] all the time this way  and I think is ideal for a patient like Ms. Ramnath.   One issue, however, with Symbicort, is it is only available in an [facility]  format, which is easy on the throat, but hard to use, if you are not  used to using push-and-breathe inhalers.  Therefore, if this is not  satisfactory, I think one option might be to use formoterol/budesonide  in a  nebulizer, which can be approved through Part B Medicare.   In terms of treating her long-term for acid reflux, I suspect that, if  she were re-challenged with bisphosphonates, the reflux might become  more of an issue.  I am going to defer this to Dr. Rollen Sox capable  hands, but I do note there is a new IV biphosphonate that can be given  once a year without any GI effects at all, if this issue becomes  problematic in the future.     [name]. [name], MD, [facility]  Electronically Signed    MBW/MedQ  DD: 03/09/2007  DT: 03/09/2007  Job #: [id number]   cc:   [place]

## 2010-11-09 NOTE — H&P (Signed)
[name], [name] NO.:  [id number]   MEDICAL RECORD NO.:  [id number]          PATIENT TYPE:  INP   LOCATION:  0103                         FACILITY:  [facility]   PHYSICIAN:  Vania Rea, M.D. DATE OF BIRTH:  [birth year]   DATE OF ADMISSION:  01/09/2009  DATE OF DISCHARGE:                              HISTORY & PHYSICAL   .   PRIMARY CARE PHYSICIAN:  [facility].   PULMONOLOGIST:  [name]. [name], MD, FCCP.   CHIEF COMPLAINT:  Weakness of the left eyelid and frequent falls worse  for the past 3 days.   HISTORY OF PRESENT ILLNESS:  This is an 77 year old Caucasian lady with  a history of asthma, hypertension, arthritis and neuropathy who has been  having recurrent falls for the past 6 months, according to her husband,  but reports twitching and weakness of her left eye and increasing and  recurrent falls for the past 3 days.  The patient says she tends to fall  to the left.  The patient was brought to the emergency room to be  evaluated for this, and initial blood work found the patient to be  severely anemic and in renal failure. The hospitalist service was called  to assist with management.   The patient denies ever being told that she has anemia or renal failure.  Says she did blood tests as recently as 1 month ago, and her home  medications include Avapro for hypertension.   She denies regular use of BC powders or over-the-counter NSAIDS.  She  has taken 2 tablets of Aleve in the past week for arthritis, but she  says this is the first time.  She denies use of any other in NSAIDs  except daily 325 mg of aspirin.  She denies nausea, vomiting, or passage  of any bloody or melenic stool.  She denies diarrhea or constipation.  She does admit to a loss of 30 pounds over the past year but says she  has regained 19 pounds of it back.  She describes her appetite is good.   Apart from arthritis, the patient does suffer with urinary  incontinence  which her husband reports has been going on for about 10 years.   She denies fever, cough or cold, chest pains or shortness of breath.   PAST MEDICAL HISTORY:  Asthma, hypertension, arthritis, neuropathy.   MEDICATIONS:  The patient is unsure of the exact medication and dosage,  but they seem to include omeprazole, Avapro, gabapentin, Ultram, and  aspirin.  She uses __________ Pulmicort and Brovana inhaler once daily.   ALLERGIES:  PENICILLIN.  She cannot remember the reaction.   She denies tobacco, alcohol or illicit drug use.  She is a retired  Scientist, product/process development.  Lives at home with her husband and family.   FAMILY HISTORY:  Significant for multiple members with asthma, mother  who died of a congestive heart failure.  Father died of dementia.  A son  with hypertension.  There is no specific family history of strokes or  coronary artery disease.   On a 10-point review of systems,  other than noted above, was  unremarkable.   PHYSICAL EXAMINATION:  A relatively healthy-looking elderly but weather-  beaten elderly Caucasian lady lying flat on the stretcher in no acute  distress.  She clearly has a ptosis of the left eye.  VITALS:  Temperature is 98.1, pulse 75, respiration 18, blood pressure  141/64.  She is saturating at 98% on room air.  Pupils are round and equal.  She does have the ptosis, but there is no  pupillary constriction, no cervical lymphadenopathy, or thyromegaly.  Chest is clear to auscultation bilaterally.  CARDIOVASCULAR:  Irregular rhythm.  ABDOMEN:  Soft and nontender.  No masses are felt.  EXTREMITIES:  Without edema.  She has 2+ dorsalis pedis pulses  bilaterally.  She has a large ecchymosis on the right forearm where she  fell today.  She has no ulcerations on the skin.  CENTRAL NERVOUS SYSTEM:  Cranial nerves II-XII are grossly intact except  her being very hard of hearing.  On exam, she has grade 5 power  throughout, and sensation is intact  throughout.  However, she has an  ataxic gait and is falling to the left.   Her white count is 12.4, hemoglobin 7.4, MCV 68.6, platelets 147.  She  had 88% neutrophils.  Absolute granulocyte count is 10.9.  Smear shows  polychromasia, toxic granulations and Dohle bodies and also greater than  20% bands.  Her I-STAT shows a sodium of 136, potassium 3.9, chloride  106, BUN 53, creatinine 2.1.  Her glucose is 88.   CT scan of the brain shows no acute abnormality.   ASSESSMENT:  1. Acute cerebrovascular accident, as evidenced by new-onset ptosis      and ataxia, questionable other acute neurological event.  2. Severe microcytic anemia.  3. Renal failure acute versus chronic.  4. The differential includes chronic blood loss, iron deficiency      anemia,  given her microcytosis.  Given he neurological disorder,      she could have a concomitant B12 deficiency.  Given her renal      failure, she could also have multiple myeloma.  5. Leukocytosis with left shift, suggest infection, most likely      urinary tract given her incontinence.  6. Hypertension controlled.  7. Asthma, stable.  8. History of arthritis.   PLAN:  1. Will start an anemia workup including SPEP and UPEP and transfuse      the patient with 2 units of packed red cells.  2. Will check chest x-ray and EKG.  3. Will withhold Lovenox and aspirin but will use TED hose.  4. Get an MRI MRI of brain to further evaluate her neurological status      and will also consult his      gastroenterologist eventually for likely endoscopy.  5. Will continue treatment of her hypertension, asthma and arthritis      as appropriate and the further treatment and evaluation based on      the results of initial studies in view of her plans as per orders.      Vania Rea, M.D.  Electronically Signed     LC/MEDQ  D:  01/09/2009  T:  01/10/2009  Job:  [id number]   cc:   [facility]   [name]. [name], MD, FCCP  [address]   [place] [address]

## 2010-11-09 NOTE — Group Therapy Note (Signed)
NAMENEESHA, LANGTON             ACCOUNT NO.:  [id number]   MEDICAL RECORD NO.:  [id number]          PATIENT TYPE:  INP   LOCATION:  1411                         [facility]ACILITY[facility]  [facility]   PHYSICIAN:  Hillery Aldo, M.D.   DATE OF BIRTH:  [birth year]                                 PROGRESS NOTE   PRIMARY CARE PHYSICIAN:  Dr. Virgina Organ at [facility].   CURRENT DIAGNOSES:  1. Recurrent falls with gait disorder.  2. Microcytic anemia, status post 2 units of packed red blood cells.  3. Acute renal failure.  4. Likely underlying stage 3 chronic kidney disease.  5. Abnormal weight loss.  6. Cerebrovascular disease.  7. Escherichia coli urinary tract infection.  8. Hypertension.  9. Hypokalemia.  10.Asthma.  11.Arthritis.  12.Left eye squinting with complaints of discomfort (status post      neurological evaluation and not found to be a true left ptosis).   DISCHARGE MEDICATIONS:  Will be dictated at the time of actual discharge.   CONSULTATIONS:  1. Dr. Arlyce Dice of Gastroenterology.  2. Dr. Anne Hahn of Neurology.   BRIEF ADMISSION HPI:  Patient is an 77 year old female who presented to the hospital with a  chief complaint of increased frequency of falling and weakness of the  left eyelid.  She presented to the hospital and was found to be severely  anemic and subsequently was referred to the hospitalist service for  further evaluation and workup.  For full details, please see the  dictated report done by Dr. Orvan Falconer.   PROCEDURES AND DIAGNOSTIC STUDIES:  1. CT scan of the head on January 09, 2009, showed no evidence of acute      intracranial abnormality.  2. MRI/MRA of the brain on January 10, 2009, showed no acute intracranial      findings.  No evidence for proximal intracranial stenosis.  3. Colonoscopy on January 12, 2009, showed internal hemorrhoids but was      otherwise normal.  4. Upper endoscopy on January 12, 2009:  Reportedly normal.  5. Capsule endoscopy:   Currently in progress.  Results pending.   DISCHARGE LABORATORY VALUES:  Will be dictated at the time of actual discharge.   HOSPITAL COURSE BY PROBLEM:  1. Recurrent falls:  Patient originally presented to the hospital with      recurrent falls.  Because of her initial presenting symptoms      consisting of possible left-sided weakness and left-sided ptosis, a      neurologic evaluation was undertaken.  Patient was not noted to      have any focal neurologic deficits other than subjective complaints      of left eye closure.  MRI and MRA scanning revealed no acute      abnormalities although she does have some chronic microvascular      ischemic changes.  This was not felt to be significant or      contributory to her complaint of falls.  She was also incidentally      found to be anemic which could have triggered orthostatic changes.      She was given  2 units of packed red blood cells to address the      anemia.  She was also seen and evaluated by Dr. Anne Hahn of      Neurology.  At this time, studies have been ordered to rule out      myasthenia gravis although this is considered an unlikely      diagnosis.  Once she is fully evaluated for her anemia, we will get      a PT and OT consultation to help with her underlying gait disorder.  2. Microcytic anemia:  Found incidentally.  Anemia studies were      consistent with iron deficiency.  At this point, the patient has      undergone both upper and lower endoscopy without any source of      blood loss identified.  She is now undergoing a capsule endoscopy      to rule out small bowel AVMs.  She has received 2 units of packed      red blood cells and her hemoglobin is slowly trending down over      time.  At this point, I am going to start her on iron      supplementation therapy and we will follow up with the capsule      endoscopy results.  Additionally, myeloma is on the differential      and is currently being ruled out.  3. Acute  renal failure:  Patient's creatinine is currently stable      after initial improvement.  Her admission creatinine was 2.1.  She      was hydrated and an SPEP/UPEP were sent off to rule out multiple      myeloma.  At this point, the SPEP and UPEP do show increased kappa      and lambda light chains.  Immunofixation studies have been ordered.      She likely has underlying stage 3 chronic kidney disease as her      creatinine has now stabilized at approximately 1.2 with a GFR      corresponding to approximately 43 mL per minute.  4. Weight loss:  So far there has not been any evidence of a GI      malignancy.  CEA studies were negative.  Multiple myeloma is in the      differential and is currently being ruled out with immunofixation      studies.  Patient's thyroid function was adequate.  5. Cerebrovascular disease:  Patient's aspirin is currently on hold      due to concerns for GI blood loss.  This should be resumed once she      has been fully evaluated and no source identified.  6. Escherichia coli urinary tract infection:  Patient was empirically      started on Cipro and subsequent speciation and sensitivity data do      reveal that Cipro was an effective agent.  We will continue her on      Cipro for 3 days of therapy.  7. Hypertension:  Patient's blood pressure is currently well      controlled.  8. Hypokalemia.  Patient's potassium was appropriately repleted.  9. Asthma:  Patient's asthma has remained stable on her usual home      treatments.  She has not had any acute flares while in the      hospital.  10.Arthritis:  Patient's arthritic pain is currently stable and      controlled with p.r.n.  oxycodone.  11.Left eyelid squinting:  Patient did get evaluated by Dr. Anne Hahn of      Neurology who did not feel that she had a true ptosis.  He felt      that the patient's squinting was under voluntary control but has      ordered acetylcholine receptor antibody studies to rule out       myasthenia gravis.  The patient's ESR is elevated.  If these      studies are unrevealing, we do recommend followup with her primary      ophthalmologist after discharge to rule out intraocular pathology.   DISPOSITION:  Patient is approaching medical stability and will likely finish up her  diagnostic testing over the next 24 to 48 hours.  Appropriate disposition will be determined at the time of discharge;  however, she will likely be discharged home with close followup.      Hillery Aldo, M.D.  Electronically Signed     CR/MEDQ  D:  01/13/2009  T:  01/13/2009  Job:  [id number]   cc:   Dr. Laqueta Due [facility]

## 2010-11-09 NOTE — Assessment & Plan Note (Signed)
[facility]                             PULMONARY OFFICE NOTE   [name], [name]                      MRN:          [id number]  DATE:04/09/2007                            DOB:          [birth year]    HISTORY OF PRESENT ILLNESS:  The patient is a 77 year old white female  patient of Dr. Thurston Hole who has a known history of chronic obstructive  asthma who presents today for an acute office visit.  The patient  complains that, over the last week, she has had productive cough with  thick yellow mucus, shortness of breath, and wheezing.  The patient  denies any hemoptysis, orthopnea, PND, or leg swelling.   PAST MEDICAL HISTORY:  Reviewed.   CURRENT MEDICATIONS:  Reviewed.   PHYSICAL EXAM:  The patient is a pleasant, elderly female in no acute  distress.  She is afebrile with stable vital signs.  O2 saturation is 95% on room  air.  HEENT:  Unremarkable.  NECK:  Supple without adenopathy.  No JVD.  LUNGS:  Reveal coarse rhonchi bilaterally with a few expiratory wheezes.  CARDIAC:  Regular rate.  ABDOMEN:  Soft, nontender.  No palpable hepatosplenomegaly.  EXTREMITIES:  Warm without any edema.   IMPRESSION AND PLAN:  Acute exacerbation of asthmatic bronchitis.  The  patient is to begin Omnicef x7 days, prednisone taper over the next  week, Mucinex DM twice daily.  The patient will return back with Dr.  [name] as scheduled or sooner if needed.      Alexandra Oaks, NP  Electronically Signed      [name]. [name], MD, [facility]  Electronically Signed   TP/MedQ  DD: 04/10/2007  DT: 04/10/2007  Job #: [id number]

## 2010-11-09 NOTE — Assessment & Plan Note (Signed)
[name]                             PULMONARY OFFICE NOTE   [name], [name]                      MRN:          [id number]  DATE:01/16/2007                            DOB:          [birth year]    PULMONARY NEW PATIENT EVALUATION:   HISTORY:  A delightful 77 year old white female who states she had never  smoked, and, despite the fact that she worked at [employer] had  no respiratory complaints until developing acute dyspnea associated with  wheezing in January of 2007, was in the emergency room for 5 hours,  discharged on prednisone and did fine for another year.  However, in  January of 2008, she had a similar attack of dyspnea with wheezing and  cough associated with hoarseness and no excess sputum production.  She  was admitted twice in January, and then since then has been back in  March and June.  Each time, she seems to improve somewhat on prednisone  but never back to baseline.  Even on her best days, she says she can  only walk about 50 feet before she gives out due to dyspnea, but has no  nocturnal complaints of coughing or subjective wheeze.  The only  symptoms she correlates with the dyspnea is severe hoarseness with voice  loss.  She says there are times when she coughs so hard it takes her  breath away.  She has never coughed so hard she vomited.   PAST MEDICAL HISTORY:  Hypertension, but note she is not on ACE  inhibitors.   ALLERGIES:  PENICILLIN.   MEDICATIONS:  1. Meloxicam.  2. Protonix.  3. Singulair.  4. Cozaar.  5. Centrum Silver.  6. Os-Cal.  7. Aspirin.  8. Boniva.  9. Albuterol and Atrovent per nebulizer that she says helps some.  10.She finished a round of prednisone on January 11, 2007 and states that      she is better than usual but still not back to normal.   SOCIAL HISTORY:  She has never smoked.  She is retired from [employer].   FAMILY HISTORY:  Recorded in detail and  significant for asthma in her  mother and 2 sisters.   REVIEW OF SYSTEMS:  Taken in detail on the work sheet, and also  negative, except as outlined above.   PHYSICAL EXAMINATION:  GENERAL:  This is an elderly white female with  weathered facies, in no acute distress.  VITAL SIGNS:  Stable.  HEENT:  Unremarkable.  Oropharynx is clear.  Nasal turbinates are  normal.  Neck is supple without cervical adenopathy or tenderness.  Trachea is midline without thyromegaly.  No pallor, polyps or cyanosis.  Ear canals are clear bilaterally.  LUNGS:  Lung fields are perfectly clear bilaterally to auscultation and  percussion with prominent pseudo-wheeze.  HEART:  Regular rhythm without murmur, gallop, or rub.  ABDOMEN:  Soft, benign.  EXTREMITIES:  Warm without calf tenderness.  No clubbing, cyanosis, or  edema.   Chest x-ray was reported to be normal on November 26, 2006.   IMPRESSION:  Class pseudoasthma, partially  responsive to treatment  directed at asthma, indicating that she may have developed secondary  asthma from the most likely mechanism driving both upper airways  instability/vocal cord dysfunction and asthma, namely reflux of the  elderly.  She is already taking Protonix before breakfast daily, so I am  going to add a second dose of PPI in the form of Zegerid 40 mg q.h.s.  Will ask her to eliminate all inhalers in favor of using nebulizer or  DuoNeb one every 6 hours p.r.n. and suppressing cough as much as  possible with her over-the-counter dextromethorphan cough syrup.   For now, I would like her to stop Boniva, since it may be contributing  to reflux, and observe a GERD diet (which excludes the use of mint,  which she uses a lot of in terms of chewing gum frequently).   If her breathing deteriorates again, she can take another round of  prednisone, which she has found effective in the past, but only a 6-day  course should be needed.   I would like to see her back in 2 weeks to  see how she is doing on the  above regimen.     [name]. Alexandra Sires, MD, [facility]  Electronically Signed    MBW/MedQ  DD: 01/16/2007  DT: 01/17/2007  Job #: [id number]   cc:   [place]

## 2012-10-21 ENCOUNTER — Encounter (HOSPITAL_COMMUNITY): Payer: Self-pay | Admitting: Emergency Medicine

## 2012-10-21 ENCOUNTER — Emergency Department (HOSPITAL_COMMUNITY): Payer: Medicare Other

## 2012-10-21 ENCOUNTER — Inpatient Hospital Stay (HOSPITAL_COMMUNITY): Payer: Medicare Other

## 2012-10-21 ENCOUNTER — Inpatient Hospital Stay (HOSPITAL_COMMUNITY)
Admission: EM | Admit: 2012-10-21 | Discharge: 2012-10-22 | DRG: 689 | Disposition: A | Discharge status: Home Health | Payer: Medicare Other | Attending: Internal Medicine | Admitting: Internal Medicine

## 2012-10-21 DIAGNOSIS — R410 Disorientation, unspecified: Secondary | ICD-10-CM

## 2012-10-21 DIAGNOSIS — F411 Generalized anxiety disorder: Secondary | ICD-10-CM | POA: Diagnosis present

## 2012-10-21 DIAGNOSIS — Z880 Allergy status to penicillin: Secondary | ICD-10-CM

## 2012-10-21 DIAGNOSIS — I10 Essential (primary) hypertension: Secondary | ICD-10-CM | POA: Diagnosis present

## 2012-10-21 DIAGNOSIS — G9340 Encephalopathy, unspecified: Secondary | ICD-10-CM

## 2012-10-21 DIAGNOSIS — J45909 Unspecified asthma, uncomplicated: Secondary | ICD-10-CM

## 2012-10-21 DIAGNOSIS — Z79899 Other long term (current) drug therapy: Secondary | ICD-10-CM

## 2012-10-21 DIAGNOSIS — N390 Urinary tract infection, site not specified: Principal | ICD-10-CM

## 2012-10-21 DIAGNOSIS — Z8673 Personal history of transient ischemic attack (TIA), and cerebral infarction without residual deficits: Secondary | ICD-10-CM

## 2012-10-21 DIAGNOSIS — Z66 Do not resuscitate: Secondary | ICD-10-CM | POA: Diagnosis present

## 2012-10-21 DIAGNOSIS — G9349 Other encephalopathy: Secondary | ICD-10-CM | POA: Diagnosis present

## 2012-10-21 DIAGNOSIS — G8929 Other chronic pain: Secondary | ICD-10-CM

## 2012-10-21 HISTORY — DX: Anxiety disorder, unspecified: F41.9

## 2012-10-21 HISTORY — DX: Essential (primary) hypertension: I10

## 2012-10-21 LAB — CBC WITH DIFFERENTIAL/PLATELET
Basophils Absolute: 0 10*3/uL (ref 0.0–0.1)
Eosinophils Absolute: 0 10*3/uL (ref 0.0–0.7)
Eosinophils Relative: 0 % (ref 0–5)
Lymphocytes Relative: 6 % — ABNORMAL LOW (ref 12–46)
Lymphs Abs: 0.8 10*3/uL (ref 0.7–4.0)
MCH: 28.6 pg (ref 26.0–34.0)
Neutrophils Relative %: 90 % — ABNORMAL HIGH (ref 43–77)
Platelets: 180 10*3/uL (ref 150–400)
RBC: 4.58 MIL/uL (ref 3.87–5.11)
RDW: 13.3 % (ref 11.5–15.5)
WBC: 13.9 10*3/uL — ABNORMAL HIGH (ref 4.0–10.5)

## 2012-10-21 LAB — POCT I-STAT TROPONIN I

## 2012-10-21 LAB — URINALYSIS, ROUTINE W REFLEX MICROSCOPIC
Bilirubin Urine: NEGATIVE
Glucose, UA: NEGATIVE mg/dL
Ketones, ur: NEGATIVE mg/dL
Nitrite: POSITIVE — AB
Specific Gravity, Urine: 1.012 (ref 1.005–1.030)
pH: 7 (ref 5.0–8.0)

## 2012-10-21 LAB — URINE MICROSCOPIC-ADD ON

## 2012-10-21 LAB — CG4 I-STAT (LACTIC ACID): Lactic Acid, Venous: 2.42 mmol/L — ABNORMAL HIGH (ref 0.5–2.2)

## 2012-10-21 LAB — CBC
MCH: 29.4 pg (ref 26.0–34.0)
MCV: 83.9 fL (ref 78.0–100.0)
Platelets: 164 10*3/uL (ref 150–400)
RDW: 13.3 % (ref 11.5–15.5)

## 2012-10-21 LAB — CREATININE, SERUM: GFR calc Af Amer: 67 mL/min — ABNORMAL LOW (ref >90)

## 2012-10-21 LAB — COMPREHENSIVE METABOLIC PANEL
ALT: 15 U/L (ref 0–35)
AST: 24 U/L (ref 0–37)
Alkaline Phosphatase: 132 U/L — ABNORMAL HIGH (ref 39–117)
Calcium: 9.2 mg/dL (ref 8.4–10.5)
GFR calc Af Amer: 59 mL/min — ABNORMAL LOW (ref >90)
Glucose, Bld: 117 mg/dL — ABNORMAL HIGH (ref 70–99)
Potassium: 4 mEq/L (ref 3.5–5.1)
Sodium: 140 mEq/L (ref 135–145)
Total Protein: 7.3 g/dL (ref 6.0–8.3)

## 2012-10-21 MED ORDER — PSYLLIUM 95 % PO PACK
1.0000 | PACK | Freq: Two times a day (BID) | ORAL | Status: DC | PRN
  Filled 2012-10-21: qty 1

## 2012-10-21 MED ORDER — IOHEXOL 300 MG/ML  SOLN
20.0000 mL | INTRAMUSCULAR | Status: AC
  Administered 2012-10-21: 50 mL via ORAL

## 2012-10-21 MED ORDER — HYDRALAZINE HCL 20 MG/ML IJ SOLN: 10.0000 mg | INTRAMUSCULAR | Status: DC | PRN

## 2012-10-21 MED ORDER — ACETAMINOPHEN 325 MG PO TABS: 650.0000 mg | ORAL_TABLET | Freq: Four times a day (QID) | ORAL | Status: DC | PRN

## 2012-10-21 MED ORDER — HYDROCODONE-ACETAMINOPHEN 5-500 MG PO TABS: 1.0000 | ORAL_TABLET | Freq: Two times a day (BID) | ORAL | Status: DC | PRN

## 2012-10-21 MED ORDER — MORPHINE SULFATE ER 15 MG PO TBCR
15.0000 mg | EXTENDED_RELEASE_TABLET | Freq: Two times a day (BID) | ORAL | Status: DC
  Administered 2012-10-22 (×2): 15 mg via ORAL
  Filled 2012-10-21 (×2): qty 1

## 2012-10-21 MED ORDER — ENOXAPARIN SODIUM 40 MG/0.4ML ~~LOC~~ SOLN
40.0000 mg | SUBCUTANEOUS | Status: DC
  Administered 2012-10-22: 40 mg via SUBCUTANEOUS
  Filled 2012-10-21 (×2): qty 0.4

## 2012-10-21 MED ORDER — FERROUS SULFATE 325 (65 FE) MG PO TABS
325.0000 mg | ORAL_TABLET | Freq: Every day | ORAL | Status: DC
  Administered 2012-10-22: 325 mg via ORAL
  Filled 2012-10-21 (×2): qty 1

## 2012-10-21 MED ORDER — SODIUM CHLORIDE 0.9 % IV SOLN
INTRAVENOUS | Status: DC
  Administered 2012-10-22 (×2): via INTRAVENOUS

## 2012-10-21 MED ORDER — HYDROCODONE-ACETAMINOPHEN 5-325 MG PO TABS
1.0000 | ORAL_TABLET | Freq: Once | ORAL | Status: AC
  Administered 2012-10-21: 1 via ORAL
  Filled 2012-10-21: qty 1

## 2012-10-21 MED ORDER — ONDANSETRON HCL 4 MG PO TABS: 4.0000 mg | ORAL_TABLET | Freq: Four times a day (QID) | ORAL | Status: DC | PRN

## 2012-10-21 MED ORDER — SODIUM CHLORIDE 0.9 % IJ SOLN
3.0000 mL | Freq: Two times a day (BID) | INTRAMUSCULAR | Status: DC
  Administered 2012-10-22 (×2): 3 mL via INTRAVENOUS

## 2012-10-21 MED ORDER — IRON PO TABS: 1.0000 | ORAL_TABLET | Freq: Every day | ORAL | Status: DC

## 2012-10-21 MED ORDER — GABAPENTIN 600 MG PO TABS
600.0000 mg | ORAL_TABLET | Freq: Two times a day (BID) | ORAL | Status: DC
  Administered 2012-10-22 (×2): 600 mg via ORAL
  Filled 2012-10-21 (×3): qty 1

## 2012-10-21 MED ORDER — SODIUM CHLORIDE 0.9 % IV BOLUS (SEPSIS)
1000.0000 mL | Freq: Once | INTRAVENOUS | Status: AC
  Administered 2012-10-21: 1000 mL via INTRAVENOUS

## 2012-10-21 MED ORDER — LORAZEPAM 0.5 MG PO TABS: 0.5000 mg | ORAL_TABLET | Freq: Every day | ORAL | Status: DC | PRN

## 2012-10-21 MED ORDER — CIPROFLOXACIN IN D5W 400 MG/200ML IV SOLN
400.0000 mg | Freq: Once | INTRAVENOUS | Status: AC
  Administered 2012-10-21: 400 mg via INTRAVENOUS
  Filled 2012-10-21: qty 200

## 2012-10-21 MED ORDER — HYDROCODONE-ACETAMINOPHEN 5-325 MG PO TABS
1.0000 | ORAL_TABLET | Freq: Two times a day (BID) | ORAL | Status: DC | PRN
  Administered 2012-10-22: 1 via ORAL
  Filled 2012-10-21: qty 1

## 2012-10-21 MED ORDER — OXYBUTYNIN 3.9 MG/24HR TD PTTW
1.0000 | MEDICATED_PATCH | TRANSDERMAL | Status: DC
  Administered 2012-10-22: 1 via TRANSDERMAL
  Filled 2012-10-21: qty 1

## 2012-10-21 MED ORDER — ACETAMINOPHEN 650 MG RE SUPP: 650.0000 mg | Freq: Four times a day (QID) | RECTAL | Status: DC | PRN

## 2012-10-21 MED ORDER — SERTRALINE HCL 50 MG PO TABS
50.0000 mg | ORAL_TABLET | Freq: Every day | ORAL | Status: DC
  Administered 2012-10-22: 50 mg via ORAL
  Filled 2012-10-21: qty 1

## 2012-10-21 MED ORDER — ONDANSETRON HCL 4 MG/2ML IJ SOLN: 4.0000 mg | Freq: Four times a day (QID) | INTRAMUSCULAR | Status: DC | PRN

## 2012-10-21 MED ORDER — SODIUM CHLORIDE 0.9 % IV SOLN
INTRAVENOUS | Status: DC
  Administered 2012-10-21: 14:00:00 via INTRAVENOUS

## 2012-10-21 MED ORDER — CIPROFLOXACIN IN D5W 200 MG/100ML IV SOLN
200.0000 mg | Freq: Two times a day (BID) | INTRAVENOUS | Status: DC
  Administered 2012-10-22: 200 mg via INTRAVENOUS
  Filled 2012-10-21 (×3): qty 100

## 2012-10-21 NOTE — ED Provider Notes (Signed)
Assumed care from Dr Rubin Payor. UA consistent with UTI. Most likely cause of confusion. Possibly some component of acute stress reaction from recent death in family. Neuro exam nonfocal focal aside from mental status. C/o leg pain but chronic per family. Pt from home. Lives with family, but they are somewhat uncomfortable with taking her home at this time. PCN allergy. Cipro ordered. PCP with [facility].   Raeford Razor, MD 10/21/12 [phone]

## 2012-10-21 NOTE — ED Notes (Signed)
Pt from home via [facility].  Per EMS, pt had family member who passed away.  Pt is on numerous pain meds for chronic pain and ativan prn.  Family gave pt Ativan at 0900 and pt became very sleepy and weak.  Pt has hx of possible TIA.  Family felt symptoms today were similar to that incident.  EMS reports GCS of 13.  Grips equal.  Pt is weak and follows some commands.  EMS also reports pt is squinting the left eye.  Sinus Arrythmia on their 12 lead.  CBG 148.  Pt has hx of incontinence and chronic UTI's.  Urine smells very strong.

## 2012-10-21 NOTE — ED Provider Notes (Signed)
History     CSN: [id number]  Arrival date & time 10/21/12  1304   First MD Initiated Contact with Patient 10/21/12 1341      Chief Complaint  Patient presents with  . Weakness   level V caveat due to altered mental status  (Consider location/radiation/quality/duration/timing/severity/associated sxs/prior treatment) Patient is a 79 y.o. female presenting with weakness. The history is provided by the patient, the EMS personnel and a relative.  Weakness   patiently reportedly comes in confused. She found out last night at around 8 PM that her sister's husband died. She was reportedly unresponsive this morning. She may have taken a "white pill". This was the patient's medicine and normally does not give her confusion. She also is a history of urinary tract infections.  Past Medical History  Diagnosis Date  . Anxiety     History reviewed. No pertinent past surgical history.  History reviewed. No pertinent family history.  History  Substance Use Topics  . Smoking status: Never Smoker   . Smokeless tobacco: Not on file  . Alcohol Use: No    OB History   Grav Para Term Preterm Abortions TAB SAB Ect Mult Living                  Review of Systems  Unable to perform ROS: Mental status change  Neurological: Positive for weakness.    Allergies  Penicillins  Home Medications   Current Outpatient Rx  Name  Route  Sig  Dispense  Refill  . Cholecalciferol (VITAMIN D3 PO)   Oral   Take 1 tablet by mouth 2 (two) times daily.         Marland Kitchen gabapentin (NEURONTIN) 600 MG tablet   Oral   Take 600 mg by mouth 2 (two) times daily.         Marland Kitchen HYDROcodone-acetaminophen (VICODIN) 5-500 MG per tablet   Oral   Take 1 tablet by mouth 2 (two) times daily as needed for pain.         . Iron TABS   Oral   Take 1 tablet by mouth daily.         Marland Kitchen LORazepam (ATIVAN) 0.5 MG tablet   Oral   Take 0.5 mg by mouth daily as needed for anxiety.         Marland Kitchen morphine (MS CONTIN) 15 MG  12 hr tablet   Oral   Take 15 mg by mouth 2 (two) times daily.         Marland Kitchen oxybutynin (OXYTROL) 3.9 MG/24HR   Transdermal   Place 1 patch onto the skin 2 (two) times a week. Mondays & Thursdays only         . psyllium (HYDROCIL/METAMUCIL) 95 % PACK   Oral   Take 1 packet by mouth 2 (two) times daily as needed (constipation).         . sertraline (ZOLOFT) 50 MG tablet   Oral   Take 50 mg by mouth daily.         . valsartan (DIOVAN) 40 MG tablet   Oral   Take 40 mg by mouth daily.           BP 122/69  Pulse 71  Temp(Src) 99.7 F (37.6 C) (Oral)  Resp 20  SpO2 99%  Physical Exam  Constitutional: She appears well-developed and well-nourished.  HENT:  Head: Normocephalic.  Eyes: Pupils are equal, round, and reactive to light.  Neck: Neck supple.  Cardiovascular: Normal rate.  Irregular rhythm  Pulmonary/Chest: Effort normal and breath sounds normal.  Abdominal: Soft. There is no tenderness.  Neurological:  Patient will arouse somewhat to pain, but will not follow commands. Moving all extremities and breathing spontaneously.   Skin: Skin is warm. No rash noted.    ED Course  Procedures (including critical care time)  Labs Reviewed  CBC WITH DIFFERENTIAL  COMPREHENSIVE METABOLIC PANEL  URINALYSIS, ROUTINE W REFLEX MICROSCOPIC   Dg Chest Port 1 View  10/21/2012  *RADIOLOGY REPORT*  Clinical Data: Altered mental status  PORTABLE CHEST - 1 VIEW  Comparison: 03/09/2010; 10/28/2009  Findings:  Grossly unchanged cardiac silhouette and mediastinal contours.  The lungs remain hyperexpanded.  No new focal airspace opacity.  No pleural effusion or pneumothorax.  Unchanged bone island within the anterior aspect of the right sixth rib. Old right-sided 5th posterior lateral rib fracture.  IMPRESSION:  Hyperexpanded lungs without acute cardiopulmonary disease.   Original Report Authenticated By: Tacey Ruiz, MD      No diagnosis found.   Date: 10/21/2012  Rate: 101   Rhythm: normal sinus rhythm and premature atrial contractions (PAC)  QRS Axis: normal  Intervals: normal  ST/T Wave abnormalities: nonspecific ST/T changes  Conduction Disutrbances:none  Narrative Interpretation: Now frequent PACs with compensatory pause   Old EKG Reviewed: changes noted    MDM  Patient presents with altered mental status. Recently had family member died. She was confused this morning. She also may have taken a "white pill". EKG is reassuring. Lab work and imaging is pending he is currently in a.  Juliet Rude. Rubin Payor, MD 10/21/12 [phone]

## 2012-10-21 NOTE — H&P (Signed)
[facility] History and Physical  [name] AVW:[id number] DOB: 10-13-1924 DOA: 10/21/2012  Referring physician: Dr. Juleen China. PCP: No primary provider on file. [facility].  Chief Complaint: Confusion.  HPI: [name] is a 79 y.o. female with history of hypertension presently off medications, asthma, chronic pain and anxiety was brought to the ER after patient was found to be confused since morning. Patient is nonfocal on arrival in the ER. Patient was found to be mildly febrile and the UA was compatible with UTI. Lactic acid level was mildly high. Patient has mild suprapubic pain otherwise denies any nausea vomiting or any diarrhea chest pain or shortness of breath. CT head was negative for acute. Patient received antibiotics and fluids. By the time I examine patient became more alert awake oriented. Patient at this time has been admitted for UTI.  Review of Systems: As presented in the history of presenting illness, rest negative.  Past Medical History  Diagnosis Date  . Anxiety   . Hypertension    History reviewed. No pertinent past surgical history. Social History:  reports that she has never smoked. She does not have any smokeless tobacco history on file. She reports that she does not drink alcohol or use illicit drugs. Lives at home. where does patient live-- Can do ADLs. Can patient participate in ADLs?  Allergies  Allergen Reactions  . Penicillins     Family History  Problem Relation Age of Onset  . Dementia Father       Prior to Admission medications   Medication Sig Start Date End Date Taking? Authorizing Provider  Cholecalciferol (VITAMIN D3 PO) Take 1 tablet by mouth 2 (two) times daily.   Yes Historical Provider, MD  gabapentin (NEURONTIN) 600 MG tablet Take 600 mg by mouth 2 (two) times daily.   Yes Historical Provider, MD  HYDROcodone-acetaminophen (VICODIN) 5-500 MG per tablet Take 1 tablet by mouth 2 (two) times daily as  needed for pain.   Yes Historical Provider, MD  Iron TABS Take 1 tablet by mouth daily.   Yes Historical Provider, MD  LORazepam (ATIVAN) 0.5 MG tablet Take 0.5 mg by mouth daily as needed for anxiety.   Yes Historical Provider, MD  morphine (MS CONTIN) 15 MG 12 hr tablet Take 15 mg by mouth 2 (two) times daily.   Yes Historical Provider, MD  oxybutynin (OXYTROL) 3.9 MG/24HR Place 1 patch onto the skin 2 (two) times a week. Mondays & Thursdays only   Yes Historical Provider, MD  psyllium (HYDROCIL/METAMUCIL) 95 % PACK Take 1 packet by mouth 2 (two) times daily as needed (constipation).   Yes Historical Provider, MD  sertraline (ZOLOFT) 50 MG tablet Take 50 mg by mouth daily.   Yes Historical Provider, MD  valsartan (DIOVAN) 40 MG tablet Take 40 mg by mouth daily.   Yes Historical Provider, MD   Physical Exam: Filed Vitals:   10/21/12 1500 10/21/12 1506 10/21/12 1925 10/21/12 1935  BP: 122/69 122/69 128/72   Pulse: 51 71    Temp:    100.6 F (38.1 C)  TempSrc:    Rectal  Resp: 18 20 17    SpO2: 96% 99% 97%      General:  Well-developed well-nourished.  Eyes: Anicteric no pallor.  ENT: No discharge from the ears eyes nose and mouth.  Neck: No mass felt.  Cardiovascular: S1-S2 heard.  Respiratory: No rhonchi no crepitations.  Abdomen: Soft nontender bowel sounds present.  Skin: No rash.  Musculoskeletal: No edema.  Psychiatric:  Appears normal.  Neurologic: Alert awake oriented to time place and person. Moves all extremities.  Labs on Admission:  Basic Metabolic Panel:  Recent Labs Lab 10/21/12 1506  NA 140  K 4.0  CL 101  CO2 30  GLUCOSE 117*  BUN 14  CREATININE 0.99  CALCIUM 9.2   Liver Function Tests:  Recent Labs Lab 10/21/12 1506  AST 24  ALT 15  ALKPHOS 132*  BILITOT 0.5  PROT 7.3  ALBUMIN 3.9   No results found for this basename: LIPASE, AMYLASE,  in the last 168 hours No results found for this basename: AMMONIA,  in the last 168  hours CBC:  Recent Labs Lab 10/21/12 1506  WBC 13.9*  NEUTROABS 12.4*  HGB 13.1  HCT 39.1  MCV 85.4  PLT 180   Cardiac Enzymes: No results found for this basename: CKTOTAL, CKMB, CKMBINDEX, TROPONINI,  in the last 168 hours  BNP (last 3 results) No results found for this basename: PROBNP,  in the last 8760 hours CBG: No results found for this basename: GLUCAP,  in the last 168 hours  Radiological Exams on Admission: Ct Head Wo Contrast  10/21/2012  *RADIOLOGY REPORT*  Clinical Data: Altered mental status  CT HEAD WITHOUT CONTRAST  Technique:  Contiguous axial images were obtained from the base of the skull through the vertex without contrast.  Comparison: MRI 03/09/2010  Findings: Brain volume is normal for age.  There is mild atrophy and mild chronic microvascular ischemia in the white matter.  No acute infarct.  Negative hemorrhage or mass.  No skull lesion is identified.  Degenerative change at C1-C2.  IMPRESSION: No acute intracranial abnormality.   Original Report Authenticated By: Janeece Riggers, M.D.    Dg Chest Port 1 View  10/21/2012  *RADIOLOGY REPORT*  Clinical Data: Altered mental status  PORTABLE CHEST - 1 VIEW  Comparison: 03/09/2010; 10/28/2009  Findings:  Grossly unchanged cardiac silhouette and mediastinal contours.  The lungs remain hyperexpanded.  No new focal airspace opacity.  No pleural effusion or pneumothorax.  Unchanged bone island within the anterior aspect of the right sixth rib. Old right-sided 5th posterior lateral rib fracture.  IMPRESSION:  Hyperexpanded lungs without acute cardiopulmonary disease.   Original Report Authenticated By: Tacey Ruiz, MD      Assessment/Plan Principal Problem:   Acute encephalopathy Active Problems:   UTI (lower urinary tract infection)   Asthma   Chronic pain   1. Acute encephalopathy secondary to UTI -  Continue with IV antibiotics. Follow urine cultures. Continue hydration. Since patient complain of mild abdominal  discomfort I have ordered CT abdomen pelvis. 2. History of hypertension presently off medications - closely follow blood pressure trends. Patient is on when necessary IV hydralazine for systolic blood pressure more than 180. 3. Asthma - presently not wheezing. 4. Chronic pain - continue home medications. 5. History of TIA.    Code Status: DO NOT RESUSCITATE.  Family Communication: Patient's daughter the bedside.  Disposition Plan: Admit to inpatient.    KAKRAKANDY,ARSHAD N. [facility] Pager [phone].  If 7PM-7AM, please contact night-coverage [email or web address] Password [facility] 10/21/2012, 8:18 PM

## 2012-10-21 NOTE — ED Notes (Signed)
Pt was given some ice water, ok by dr. Juleen China.

## 2012-10-21 NOTE — ED Notes (Signed)
Family states she fell twice at home twice this morning.

## 2012-10-21 NOTE — Progress Notes (Signed)
ANTIBIOTIC CONSULT NOTE - INITIAL  Pharmacy Consult for ciprofloxacin Indication: UTI  Allergies  Allergen Reactions  . Penicillins     Patient Measurements: Height: 5\' 1"  (154.9 cm) Weight: 129 lb 13.6 oz (58.9 kg) IBW/kg (Calculated) : 47.8  Vital Signs: Temp: 98.1 F (36.7 C) (04/27 2138) Temp src: Oral (04/27 2138) BP: 163/66 mmHg (04/27 2138) Pulse Rate: 61 (04/27 2138) Intake/Output from previous day:   Intake/Output from this shift:    Labs:  Recent Labs  10/21/12 1506  WBC 13.9*  HGB 13.1  PLT 180  CREATININE 0.99   Estimated Creatinine Clearance: 34.2 ml/min (by C-G formula based on Cr of 0.99). No results found for this basename: VANCOTROUGH, VANCOPEAK, VANCORANDOM, GENTTROUGH, GENTPEAK, GENTRANDOM, TOBRATROUGH, TOBRAPEAK, TOBRARND, AMIKACINPEAK, AMIKACINTROU, AMIKACIN,  in the last 72 hours   Microbiology: No results found for this or any previous visit (from the past 720 hour(s)).  Medical History: Past Medical History  Diagnosis Date  . Anxiety   . Hypertension     Medications:  Anti-infectives   Start     Dose/Rate Route Frequency Ordered Stop   10/22/12 0800  ciprofloxacin (CIPRO) IVPB 200 mg     200 mg 100 mL/hr over 60 Minutes Intravenous Every 12 hours 10/21/12 2144     10/21/12 1900  ciprofloxacin (CIPRO) IVPB 400 mg     400 mg 200 mL/hr over 60 Minutes Intravenous  Once 10/21/12 1852 10/21/12 1956     Assessment: 87 yof presented to the ED with AMS and weakness. She has already received 1 x dose of cipro 400mg . Tmax is 100.6 and WBC is 13.9. Renal fxn is poor d/t age.   Goal of Therapy:  Eradication of infection  Plan:  1. Cipro 200mg  IV Q12H 2. F/u renal fxn, C&S, clinical status and LOT  Braeton Wolgamott, Drake Leach 10/21/2012,9:45 PM

## 2012-10-22 ENCOUNTER — Encounter (HOSPITAL_COMMUNITY): Payer: Self-pay | Admitting: *Deleted

## 2012-10-22 LAB — BASIC METABOLIC PANEL
BUN: 13 mg/dL (ref 6–23)
CO2: 24 mEq/L (ref 19–32)
Chloride: 107 mEq/L (ref 96–112)
Glucose, Bld: 103 mg/dL — ABNORMAL HIGH (ref 70–99)
Potassium: 3.6 mEq/L (ref 3.5–5.1)

## 2012-10-22 LAB — CBC
HCT: 32.2 % — ABNORMAL LOW (ref 36.0–46.0)
Hemoglobin: 11.3 g/dL — ABNORMAL LOW (ref 12.0–15.0)
MCH: 29.4 pg (ref 26.0–34.0)
MCHC: 35.1 g/dL (ref 30.0–36.0)
MCV: 83.6 fL (ref 78.0–100.0)

## 2012-10-22 LAB — TSH: TSH: 0.749 u[IU]/mL (ref 0.350–4.500)

## 2012-10-22 LAB — HEPATIC FUNCTION PANEL
Albumin: 3.2 g/dL — ABNORMAL LOW (ref 3.5–5.2)
Total Bilirubin: 0.5 mg/dL (ref 0.3–1.2)
Total Protein: 5.9 g/dL — ABNORMAL LOW (ref 6.0–8.3)

## 2012-10-22 LAB — LACTIC ACID, PLASMA: Lactic Acid, Venous: 0.8 mmol/L (ref 0.5–2.2)

## 2012-10-22 MED ORDER — CIPROFLOXACIN HCL 500 MG PO TABS: 250.0000 mg | ORAL_TABLET | Freq: Two times a day (BID) | ORAL | Status: DC

## 2012-10-22 MED ORDER — FLUTICASONE PROPIONATE HFA 44 MCG/ACT IN AERO
2.0000 | INHALATION_SPRAY | Freq: Two times a day (BID) | RESPIRATORY_TRACT | Status: DC | PRN
  Filled 2012-10-22: qty 10.6

## 2012-10-22 MED ORDER — SODIUM CHLORIDE 0.9 % IV SOLN
INTRAVENOUS | Status: DC
  Administered 2012-10-22: 12:00:00 via INTRAVENOUS

## 2012-10-22 MED ORDER — LEVALBUTEROL HCL 0.63 MG/3ML IN NEBU
0.6300 mg | INHALATION_SOLUTION | Freq: Four times a day (QID) | RESPIRATORY_TRACT | Status: DC | PRN
  Filled 2012-10-22: qty 3

## 2012-10-22 NOTE — Progress Notes (Signed)
Patient is currently active with long-term disease management services with [facility]. Patient will receive a post discharge transition of care call and continued monthly home visits for assessments and for education.      Aime Carreras, MSN-Ed, RN,BSN, [facility] Liaison, [phone]  

## 2012-10-22 NOTE — Care Management Note (Signed)
    Page 1 of 1   10/22/2012     4:36:10 PM   CARE MANAGEMENT NOTE 10/22/2012  Patient:  [name], [name]   Account Number:  [id number]  Date Initiated:  10/22/2012  Documentation initiated by:  Oletta Cohn  Subjective/Objective Assessment:   79 y.o. female with history of hypertension presently off medications, asthma, chronic pain and anxiety was brought to the ER after patient was found to be confused since morning.     Action/Plan:   Home with Home Health   Anticipated DC Date:  10/22/2012   Anticipated DC Plan:  HOME W HOME HEALTH SERVICES      DC Planning Services  CM consult      [facility] Choice  HOME HEALTH   Choice offered to / List presented to:  C-1 Patient        HH arranged  HH-2 PT      [facility] agency  [facility]   Status of service:  Completed, signed off Medicare Important Message given?   (If response is "NO", the following Medicare IM given date fields will be blank) Date Medicare IM given:   Date Additional Medicare IM given:    Discharge Disposition:    Per UR Regulation:    If discussed at Long Length of Stay Meetings, dates discussed:    Comments:  10/22/12 1545t.Marland KitchenMarland KitchenOletta Cohn, RN,BSN, [place] [phone] Spoke with pt regarding discharge planning.  Offered pt list of [facility] Agencies.  Pt chose [facility] to render services.  Debbie of [facility] notified.No DME needs identified at this time.

## 2012-10-22 NOTE — Evaluation (Signed)
Occupational Therapy Evaluation and Discharge Patient Details Name: [name] MRN: [id number] DOB: [birth year] Today's Date: 10/22/2012 Time: 1020-1040 OT Time Calculation (min): 20 min  OT Assessment / Plan / Recommendation Clinical Impression  This 79 yo admitted with confusion and found to have UTI presents to acute OT with all education completed and has someone in the house with her 24/7. No further OT needs, will sign off.    OT Assessment  Patient does not need any further OT services    Follow Up Recommendations  No OT follow up       Equipment Recommendations  None recommended by OT          Precautions / Restrictions Precautions Precautions: Fall Restrictions Weight Bearing Restrictions: No       ADL  Eating/Feeding: Simulated;Independent Where Assessed - Eating/Feeding: Edge of bed Grooming: Simulated;Min guard Where Assessed - Grooming: Supported standing Upper Body Bathing: Simulated;Set up;Supervision/safety Where Assessed - Upper Body Bathing: Unsupported sitting Lower Body Bathing: Simulated;Min guard Where Assessed - Lower Body Bathing: Supported sit to stand Upper Body Dressing: Simulated;Set up;Supervision/safety Where Assessed - Upper Body Dressing: Unsupported sitting Lower Body Dressing: Simulated;Min guard Where Assessed - Lower Body Dressing: Supported sit to stand Toilet Transfer: Performed;Minimal assistance (tendency to push posteriorly against surface with calves) Toilet Transfer Method: Sit to Barista: Bedside commode Toileting - Clothing Manipulation and Hygiene: Performed;Min guard Where Assessed - Engineer, mining and Hygiene: Standing Equipment Used: Rolling walker;Gait belt Transfers/Ambulation Related to ADLs: Min A for all        Visit Information  Last OT Received On: 10/22/12 Assistance Needed: +1    Subjective Data  Subjective: I hope I get to go home today   Prior  Functioning     Home Living Lives With: Son;Daughter Available Help at Discharge: Available PRN/intermittently Type of Home: House Home Access: Stairs to enter [employer] of Steps: 1 Entrance Stairs-Rails: Can reach both Home Layout: One level Bathroom Shower/Tub:  (sponge baths) Bathroom Toilet: Standard Bathroom Accessibility: Yes How Accessible: Accessible via walker Home Adaptive Equipment: Dan Humphreys - four wheeled;Bedside commode/3-in-1;Wheelchair - manual Prior Function Level of Independence: Independent with assistive device(s) (RW) Able to Take Stairs?: No Driving: No Vocation: Retired Comments: Advice worker: HOH (in her right ear) Dominant Hand: Left            Cognition  Cognition Arousal/Alertness: Awake/alert Behavior During Therapy: WFL for tasks assessed/performed Overall Cognitive Status: Within Functional Limits for tasks assessed    Extremity/Trunk Assessment Right Upper Extremity Assessment RUE ROM/Strength/Tone: Within functional levels Left Upper Extremity Assessment LUE ROM/Strength/Tone: Within functional levels     Mobility Bed Mobility Details for Bed Mobility Assistance: Pt up on BSC upon my arrival. Transfers Transfers: Sit to Stand;Stand to Sit Sit to Stand: 4: Min assist;With upper extremity assist;With armrests;From chair/3-in-1 Stand to Sit: 4: Min assist;With upper extremity assist;With armrests;To chair/3-in-1 Details for Transfer Assistance: VCs for safe hand placement. Daughter does report that she pulls up on her 4 wheeled walker at home           End of Session OT - End of Session Equipment Utilized During Treatment: Gait belt Activity Tolerance:  (mild DOE, that I could hear) Patient left: in chair;with family/visitor present Nurse Communication:  (Nurse in hallway, saw me ambulating with pt)    Evette Georges [phone] 10/22/2012, 11:18 AM

## 2012-10-22 NOTE — Progress Notes (Signed)
Physical Therapy Evaluation Patient Details Name: [name] MRN: [id number] DOB: [birth year] Today's Date: 10/22/2012 Time: 4098-1191 PT Time Calculation (min): 15 min  PT Assessment / Plan / Recommendation Clinical Impression  79 yo female admitted with UTI and confusion presents to PT with generalized weakness, gait deviations, and decr functional mobility; Will benefit from PT to maximize independence and safety with mobility and enable safe dc home    PT Assessment  Patient needs continued PT services    Follow Up Recommendations  Home health PT;Supervision/Assistance - 24 hour    Does the patient have the potential to tolerate intense rehabilitation      Barriers to Discharge        Equipment Recommendations  None recommended by PT    Recommendations for Other Services     Frequency Min 3X/week    Precautions / Restrictions Precautions Precautions: Fall   Pertinent Vitals/Pain no apparent distress       Mobility  Bed Mobility Bed Mobility: Not assessed Details for Bed Mobility Assistance: OOb in chair upon arrival Transfers Transfers: Sit to Stand;Stand to Sit Sit to Stand: 4: Min assist;With upper extremity assist;With armrests;From chair/3-in-1 Stand to Sit: 4: Min assist;With upper extremity assist;With armrests;To chair/3-in-1 Details for Transfer Assistance: VCs for safe hand placement. Daughter does report that she pulls up on her 4 wheeled walker at home Ambulation/Gait Ambulation/Gait Assistance: 4: Min assist Ambulation Distance (Feet): 40 Feet Assistive device: 4-wheeled walker Ambulation/Gait Assistance Details: Min assist for balance, which daughter reports she does have min assist, but occasionally gets up without her daughter; Noted decr stance stability with Left hip drop in R stance Gait Pattern: Lateral hip instability;Trendelenburg Gait velocity: slowed    Exercises Other Exercises Other Exercises: high-step marching with UE support on  RW; Focus on hip stabilty and gluteal setting; march in place x5   PT Diagnosis: Difficulty walking;Generalized weakness  PT Problem List: Decreased strength;Decreased activity tolerance;Decreased balance;Decreased mobility;Decreased knowledge of use of DME;Decreased knowledge of precautions PT Treatment Interventions: DME instruction;Gait training;Stair training;Functional mobility training;Therapeutic activities;Therapeutic exercise;Balance training;Patient/family education   PT Goals Acute Rehab PT Goals PT Goal Formulation: With patient Time For Goal Achievement: 10/22/12 Potential to Achieve Goals: Good Pt will go Supine/Side to Sit: with modified independence PT Goal: Supine/Side to Sit - Progress: Goal set today Pt will go Sit to Supine/Side: with modified independence PT Goal: Sit to Supine/Side - Progress: Goal set today Pt will go Sit to Stand: with supervision PT Goal: Sit to Stand - Progress: Goal set today Pt will go Stand to Sit: with supervision PT Goal: Stand to Sit - Progress: Goal set today Pt will Ambulate: 51 - 150 feet;with supervision;with rolling walker PT Goal: Ambulate - Progress: Goal set today Pt will Perform Home Exercise Program: Independently PT Goal: Perform Home Exercise Program - Progress: Goal set today  Visit Information  Last PT Received On: 10/22/12 Assistance Needed: +1    Subjective Data  Subjective: Pt and visitor are excited about possibly going home Patient Stated Goal: Home   Prior Functioning  Home Living Lives With: Son;Daughter Available Help at Discharge: Available PRN/intermittently Type of Home: House Home Access: Stairs to enter [employer] of Steps: 1 Entrance Stairs-Rails: Can reach both Home Layout: One level Bathroom Shower/Tub: Tub/shower unit;Door [employer] Toilet: Standard Bathroom Accessibility: Yes How Accessible: Accessible via walker Home Adaptive Equipment: Dan Humphreys - four wheeled;Bedside  commode/3-in-1;Wheelchair - manual Prior Function Level of Independence: Independent with assistive device(s) (4-wheeled RW) Able to Take Stairs?:  No Driving: No 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[employer] Dominant Hand: Left    Cognition  Cognition Arousal/Alertness: Awake/alert Behavior During Therapy: WFL for tasks assessed/performed Overall Cognitive Status: Within Functional Limits for tasks assessed    Extremity/Trunk Assessment Right Upper Extremity Assessment RUE ROM/Strength/Tone: Within functional levels Left Upper Extremity Assessment LUE ROM/Strength/Tone: Within functional levels Right Lower Extremity Assessment RLE ROM/Strength/Tone: Deficits RLE ROM/Strength/Tone Deficits: Generally weak, requiring dependence on UEs for successful sit to stand; noted also Right hip weakness in single limb stance with trendelenburg gait Left Lower Extremity Assessment LLE ROM/Strength/Tone: Deficits LLE ROM/Strength/Tone Deficits: Generally weak   Balance    End of Session PT - End of Session Equipment Utilized During Treatment: Gait belt Activity Tolerance: Patient tolerated treatment well Patient left: in chair;with call bell/phone within reach;with family/visitor present  GP     Van Clines Hamff 10/22/2012, 4:12 PM

## 2012-10-22 NOTE — Progress Notes (Signed)
Utilization Review Completed.   Marshae Azam, RN, BSN Nurse Case Manager  [phone]  

## 2012-10-24 NOTE — Discharge Summary (Signed)
*Physician Discharge Summary  [name] MVH:[id number] DOB: 05/10/1925 DOA: 10/21/2012  PCP: No primary provider on file.  Admit date: 10/21/2012 Discharge date: 10/22/2012  Time spent: 45 minutes  Recommendations for Outpatient Follow-up:  1. PCP in 1week  Discharge Diagnoses:  Principal Problem:   Acute encephalopathy Active Problems:   UTI (lower urinary tract infection)   Asthma   Chronic pain   Discharge Condition: improved  Diet recommendation: regular  Filed Weights   10/21/12 2138  Weight: 58.9 kg (129 lb 13.6 oz)    History of present illness:  [name] is a 79 y.o. female with history of hypertension presently off medications, asthma, chronic pain and anxiety was brought to the ER after patient was found to be confused since morning. Patient is nonfocal on arrival in the ER. Patient was found to be mildly febrile and the UA was compatible with UTI. Lactic acid level was mildly high. Patient has mild suprapubic pain otherwise denies any nausea vomiting or any diarrhea chest pain or shortness of breath. CT head was negative for acute. Patient received antibiotics and fluids. By the time I examine patient became more alert awake oriented. Patient at this time has been admitted for UTI   Hospital Course:   [facility] was started on IV ciprofloxacin and in a few hours her mentation was at baseline per family, was also had a CT Abd pelvis done on admission due to abdominal pain on admission, CT was unremarkable.  Her pain had resolved by few hours, she was tolerating a diet, and was anxious to be discharged home and hence she was discharged home with her family on PO CIproflixacin to complete the antibiotic course  Discharge Exam: Filed Vitals:   10/21/12 2138 10/22/12 0527 10/22/12 0858 10/22/12 1338  BP: 163/66 140/60 157/57 149/56  Pulse: 61 57 59 55  Temp: 98.1 F (36.7 C) 97.8 F (36.6 C) 98.3 F (36.8 C) 98.9 F (37.2 C)  TempSrc: Oral Oral    Resp: 18  18 18 17   Height: 5\' 1"  (1.549 m)     Weight: 58.9 kg (129 lb 13.6 oz)     SpO2: 99% 97% 98% 98%    General: AAOx3 Cardiovascular: S1S2/RRR Respiratory: CTAB  Discharge Instructions  Discharge Orders   Future Orders Complete By Expires     Diet - low sodium heart healthy  As directed     Increase activity slowly  As directed         Medication List    TAKE these medications       ciprofloxacin 500 MG tablet  Commonly known as:  CIPRO  Take 0.5 tablets (250 mg total) by mouth 2 (two) times daily. For 3 days     gabapentin 600 MG tablet  Commonly known as:  NEURONTIN  Take 600 mg by mouth 2 (two) times daily.     HYDROcodone-acetaminophen 5-500 MG per tablet  Commonly known as:  VICODIN  Take 1 tablet by mouth 2 (two) times daily as needed for pain.     Iron Tabs  Take 1 tablet by mouth daily.     LORazepam 0.5 MG tablet  Commonly known as:  ATIVAN  Take 0.5 mg by mouth daily as needed for anxiety.     morphine 15 MG 12 hr tablet  Commonly known as:  MS CONTIN  Take 15 mg by mouth 2 (two) times daily.     oxybutynin 3.9 MG/24HR  Commonly known as:  OXYTROL  Place 1  patch onto the skin 2 (two) times a week. Mondays & Thursdays only     psyllium 95 % Pack  Commonly known as:  HYDROCIL/METAMUCIL  Take 1 packet by mouth 2 (two) times daily as needed (constipation).     sertraline 50 MG tablet  Commonly known as:  ZOLOFT  Take 50 mg by mouth daily.     valsartan 40 MG tablet  Commonly known as:  DIOVAN  Take 40 mg by mouth daily.     VITAMIN D3 PO  Take 1 tablet by mouth 2 (two) times daily.          The results of significant diagnostics from this hospitalization (including imaging, microbiology, ancillary and laboratory) are listed below for reference.    Significant Diagnostic Studies: Ct Abdomen Pelvis Wo Contrast  10/22/2012  *RADIOLOGY REPORT*  Clinical Data: Abdominal pain.  Altered mental status and weakness. Possible TIA.  Numerous  medications for chronic pain.  CT ABDOMEN AND PELVIS WITHOUT CONTRAST  Technique:  Multidetector CT imaging of the abdomen and pelvis was performed following the standard protocol without intravenous contrast.  Comparison: None.  Findings: Mild dependent atelectasis and/or free motion artifact in the lung bases.  Small esophageal hiatal hernia.  Technically limited study due to motion artifact.  Diffuse pancreatic atrophy.  Unenhanced appearance of the liver, spleen, gallbladder, adrenal glands, kidneys, inferior vena cava, and retroperitoneal lymph nodes is unremarkable.  Calcification of the aorta without aneurysm.  The stomach, small bowel, and colon are decompressed.  Contrast material flows to the rectosigmoid colon without evidence of obstruction.  Diffusely stool filled colon.  No free air or free fluid is demonstrated in the abdomen.  Pelvis:  Bladder wall is not thickened although there are small bladder diverticula in the right and left lateral regions.  No free or loculated pelvic fluid collections.  Uterus and ovaries are not significantly enlarged.  The appendix is normal.  No evidence of diverticulitis.  No significant pelvic lymphadenopathy.  No abdominal wall musculature appears intact.  Degenerative changes throughout the lumbar spine.  Degenerative disc disease at multiple lumbar levels.  No displaced fractures identified.  IMPRESSION: No definite acute process demonstrated in the abdomen or pelvis. Small esophageal hiatal hernia.  These pancreatic atrophy.  Small bladder diverticulum.   Original Report Authenticated By: Burman Nieves, M.D.    Ct Head Wo Contrast  10/21/2012  *RADIOLOGY REPORT*  Clinical Data: Altered mental status  CT HEAD WITHOUT CONTRAST  Technique:  Contiguous axial images were obtained from the base of the skull through the vertex without contrast.  Comparison: MRI 03/09/2010  Findings: Brain volume is normal for age.  There is mild atrophy and mild chronic microvascular  ischemia in the white matter.  No acute infarct.  Negative hemorrhage or mass.  No skull lesion is identified.  Degenerative change at C1-C2.  IMPRESSION: No acute intracranial abnormality.   Original Report Authenticated By: Janeece Riggers, M.D.    Dg Chest Port 1 View  10/21/2012  *RADIOLOGY REPORT*  Clinical Data: Altered mental status  PORTABLE CHEST - 1 VIEW  Comparison: 03/09/2010; 10/28/2009  Findings:  Grossly unchanged cardiac silhouette and mediastinal contours.  The lungs remain hyperexpanded.  No new focal airspace opacity.  No pleural effusion or pneumothorax.  Unchanged bone island within the anterior aspect of the right sixth rib. Old right-sided 5th posterior lateral rib fracture.  IMPRESSION:  Hyperexpanded lungs without acute cardiopulmonary disease.   Original Report Authenticated By: Tacey Ruiz, MD  Microbiology: No results found for this or any previous visit (from the past 240 hour(s)).   Labs: Basic Metabolic Panel:  Recent Labs Lab 10/21/12 1506 10/21/12 2145 10/22/12 0500  NA 140  --  140  K 4.0  --  3.6  CL 101  --  107  CO2 30  --  24  GLUCOSE 117*  --  103*  BUN 14  --  13  CREATININE 0.99 0.89 0.84  CALCIUM 9.2  --  8.7   Liver Function Tests:  Recent Labs Lab 10/21/12 1506 10/22/12 0500  AST 24 17  ALT 15 11  ALKPHOS 132* 98  BILITOT 0.5 0.5  PROT 7.3 5.9*  ALBUMIN 3.9 3.2*    Recent Labs Lab 10/21/12 1506  LIPASE 10*   No results found for this basename: AMMONIA,  in the last 168 hours CBC:  Recent Labs Lab 10/21/12 1506 10/21/12 2145 10/22/12 0500  WBC 13.9* 12.0* 6.8  NEUTROABS 12.4*  --   --   HGB 13.1 11.7* 11.3*  HCT 39.1 33.4* 32.2*  MCV 85.4 83.9 83.6  PLT 180 164 138*   Cardiac Enzymes: No results found for this basename: CKTOTAL, CKMB, CKMBINDEX, TROPONINI,  in the last 168 hours BNP: BNP (last 3 results) No results found for this basename: PROBNP,  in the last 8760 hours CBG: No results found for this  basename: GLUCAP,  in the last 168 hours     Signed:  [place]  [facility] 10/24/2012, 11:56 AM

## 2012-11-06 ENCOUNTER — Other Ambulatory Visit: Payer: Self-pay | Admitting: Physician Assistant

## 2012-11-07 NOTE — Telephone Encounter (Signed)
LAST OV 10/13 

## 2012-11-09 ENCOUNTER — Other Ambulatory Visit: Payer: Self-pay | Admitting: Physician Assistant

## 2012-12-31 ENCOUNTER — Telehealth: Payer: Self-pay | Admitting: Family Medicine

## 2012-12-31 NOTE — Telephone Encounter (Signed)
Appt given

## 2013-01-09 ENCOUNTER — Ambulatory Visit (INDEPENDENT_AMBULATORY_CARE_PROVIDER_SITE_OTHER): Payer: Medicare Other | Admitting: Family Medicine

## 2013-01-09 ENCOUNTER — Encounter: Payer: Self-pay | Admitting: Family Medicine

## 2013-01-09 ENCOUNTER — Ambulatory Visit (INDEPENDENT_AMBULATORY_CARE_PROVIDER_SITE_OTHER): Payer: Medicare Other

## 2013-01-09 VITALS — BP 171/69 | HR 59 | Temp 97.6°F | Wt 128.0 lb

## 2013-01-09 DIAGNOSIS — G629 Polyneuropathy, unspecified: Secondary | ICD-10-CM

## 2013-01-09 DIAGNOSIS — R2681 Unsteadiness on feet: Secondary | ICD-10-CM

## 2013-01-09 DIAGNOSIS — E538 Deficiency of other specified B group vitamins: Secondary | ICD-10-CM

## 2013-01-09 DIAGNOSIS — M79609 Pain in unspecified limb: Secondary | ICD-10-CM

## 2013-01-09 DIAGNOSIS — R269 Unspecified abnormalities of gait and mobility: Secondary | ICD-10-CM

## 2013-01-09 DIAGNOSIS — G609 Hereditary and idiopathic neuropathy, unspecified: Secondary | ICD-10-CM

## 2013-01-09 DIAGNOSIS — I10 Essential (primary) hypertension: Secondary | ICD-10-CM

## 2013-01-09 LAB — POCT CBC
Granulocyte percent: 71.6 %G (ref 37–80)
Lymph, poc: 1.4 (ref 0.6–3.4)
MCV: 85.7 fL (ref 80–97)
MPV: 8.1 fL (ref 0–99.8)
POC Granulocyte: 4.2 (ref 2–6.9)
POC LYMPH PERCENT: 23 %L (ref 10–50)
Platelet Count, POC: 142 10*3/uL (ref 142–424)
RBC: 4.6 M/uL (ref 4.04–5.48)
RDW, POC: 13.5 %

## 2013-01-09 LAB — HEPATIC FUNCTION PANEL
Alkaline Phosphatase: 113 U/L (ref 39–117)
Bilirubin, Direct: 0.1 mg/dL (ref 0.0–0.3)
Indirect Bilirubin: 0.4 mg/dL (ref 0.0–0.9)
Total Bilirubin: 0.5 mg/dL (ref 0.3–1.2)

## 2013-01-09 NOTE — Progress Notes (Addendum)
  Subjective:    Patient ID: [name], female    DOB: 14-Mar-1925, 79 y.o.   MRN: [id number]  HPI Patient comes in today to be evaluated for bilateral leg pain. She takes a lot of medications and these apparently do not seem to be helping her now. She does have gait instability. She presents herself in a wheelchair and needed assistance with getting on the exam table. The patient is accompanied by her daughter today, Ky Barban. Her phone number is [phone].  Review of Systems  Constitutional: Positive for fatigue.  HENT: Negative.   Eyes: Negative.   Respiratory: Positive for shortness of breath (asthma).   Cardiovascular: Negative.   Gastrointestinal: Negative.   Endocrine: Negative.   Genitourinary: Difficulty urinating: incontinence.  Musculoskeletal: Negative.   Skin: Negative.   Allergic/Immunologic: Negative.   Neurological: Positive for weakness.  Hematological: Negative.   Psychiatric/Behavioral: Negative.        Objective:   Physical Exam  Vitals reviewed. Constitutional: She is oriented to person, place, and time. She appears well-developed and well-nourished. No distress.  HENT:  Head: Normocephalic and atraumatic.  Eyes: Conjunctivae are normal. Right eye exhibits no discharge. Left eye exhibits no discharge.  Neck: Normal range of motion. Neck supple. No thyromegaly present.  Cardiovascular: Normal rate, regular rhythm and normal heart sounds.  Exam reveals no gallop and no friction rub.   No murmur heard. Pulmonary/Chest: Effort normal and breath sounds normal. No respiratory distress. She has no wheezes. She has no rales.  Abdominal: Soft. Bowel sounds are normal. She exhibits no distension and no mass. There is no tenderness. There is no rebound and no guarding.  Musculoskeletal: Normal range of motion. She exhibits no edema and no tenderness.  Lymphadenopathy:    She has no cervical adenopathy.  Neurological: She is alert and oriented to person,  place, and time. She has normal reflexes.  She had good dorsalis pedis pulses bilaterally. And sensation was good bilaterally both feet  Skin: Skin is warm and dry. No rash noted. No erythema.  Psychiatric: She has a normal mood and affect. Her behavior is normal. Judgment and thought content normal.  For age    [facility] reading (PRIMARY) by  Dr.Moore: LS-spine --severe degenerative disc disease and disc space narrowing and osteoporosis                                      Assessment & Plan:  1. Gait instability  2. Peripheral neuropathy - BASIC METABOLIC PANEL WITH GFR - Hepatic function panel - DG Lumbar Spine 2-3 Views; Future  3. B12 deficiency - POCT CBC - Vitamin B12  4. Hypertension - BASIC METABOLIC PANEL WITH GFR  Patient Instructions  We will be back in touch with you for followup visit once labs and x-rays are returned Most importantly be careful and did not fall   Nyra Capes MD

## 2013-01-09 NOTE — Patient Instructions (Signed)
We will be back in touch with you for followup visit once labs and x-rays are returned Most importantly be careful and did not fall

## 2013-01-09 NOTE — Progress Notes (Deleted)
Patient ID: [name], female   DOB: 1924/08/18, 79 y.o.   MRN: [id number]

## 2013-01-10 LAB — BASIC METABOLIC PANEL WITH GFR
Chloride: 102 mEq/L (ref 96–112)
GFR, Est African American: 54 mL/min — ABNORMAL LOW
GFR, Est Non African American: 47 mL/min — ABNORMAL LOW
Potassium: 4.9 mEq/L (ref 3.5–5.3)
Sodium: 141 mEq/L (ref 135–145)

## 2013-01-14 ENCOUNTER — Telehealth: Payer: Self-pay

## 2013-01-14 NOTE — Telephone Encounter (Signed)
Please schedule this because of severe pain and inability to walk

## 2013-01-14 NOTE — Telephone Encounter (Signed)
Patient said a MRI back was suppose to be scheduled by Dr Christell Constant  Nothing in referral workqueue

## 2013-01-14 NOTE — Telephone Encounter (Signed)
Please schedule this because of pain and limited ability to walk

## 2013-01-17 ENCOUNTER — Other Ambulatory Visit: Payer: Self-pay | Admitting: *Deleted

## 2013-01-17 DIAGNOSIS — M545 Low back pain, unspecified: Secondary | ICD-10-CM

## 2013-01-22 ENCOUNTER — Ambulatory Visit (HOSPITAL_COMMUNITY): Payer: Medicare Other

## 2013-01-28 ENCOUNTER — Encounter: Payer: Self-pay | Admitting: Family Medicine

## 2013-01-28 ENCOUNTER — Ambulatory Visit (INDEPENDENT_AMBULATORY_CARE_PROVIDER_SITE_OTHER): Payer: Medicare Other | Admitting: Family Medicine

## 2013-01-28 ENCOUNTER — Ambulatory Visit (INDEPENDENT_AMBULATORY_CARE_PROVIDER_SITE_OTHER): Payer: Medicare Other

## 2013-01-28 VITALS — BP 132/73 | HR 73 | Temp 97.7°F | Wt 127.2 lb

## 2013-01-28 DIAGNOSIS — S90129A Contusion of unspecified lesser toe(s) without damage to nail, initial encounter: Secondary | ICD-10-CM

## 2013-01-28 DIAGNOSIS — L0390 Cellulitis, unspecified: Secondary | ICD-10-CM | POA: Insufficient documentation

## 2013-01-28 DIAGNOSIS — S90122A Contusion of left lesser toe(s) without damage to nail, initial encounter: Secondary | ICD-10-CM

## 2013-01-28 DIAGNOSIS — L0291 Cutaneous abscess, unspecified: Secondary | ICD-10-CM

## 2013-01-28 LAB — POCT CBC
Granulocyte percent: 84.5 %G — AB (ref 37–80)
HCT, POC: 35.6 % — AB (ref 37.7–47.9)
Hemoglobin: 11.8 g/dL — AB (ref 12.2–16.2)
Lymph, poc: 1.1 (ref 0.6–3.4)
MCH, POC: 28.4 pg (ref 27–31.2)
MCHC: 33.1 g/dL (ref 31.8–35.4)
MCV: 85.8 fL (ref 80–97)
MPV: 8.6 fL (ref 0–99.8)
POC Granulocyte: 8.1 — AB (ref 2–6.9)
POC LYMPH PERCENT: 11 %L (ref 10–50)
Platelet Count, POC: 179 10*3/uL (ref 142–424)
RBC: 4.1 M/uL (ref 4.04–5.48)
RDW, POC: 13.9 %
WBC: 9.6 10*3/uL (ref 4.6–10.2)

## 2013-01-28 MED ORDER — CLINDAMYCIN HCL 300 MG PO CAPS: 300.0000 mg | ORAL_CAPSULE | Freq: Three times a day (TID) | ORAL | Status: DC

## 2013-01-28 MED ORDER — MUPIROCIN 2 % EX OINT: TOPICAL_OINTMENT | Freq: Three times a day (TID) | CUTANEOUS | Status: AC

## 2013-01-28 MED ORDER — SULFAMETHOXAZOLE-TMP DS 800-160 MG PO TABS: 1.0000 | ORAL_TABLET | Freq: Two times a day (BID) | ORAL | Status: DC

## 2013-01-28 NOTE — Progress Notes (Signed)
Patient ID: [name], female   DOB: May 05, 1925, 79 y.o.   MRN: [id number] SUBJECTIVE: CC: Chief Complaint  Patient presents with  . Fall    fell last tuesday in living room c/o left foot great toe infected. being treated by Guy Sandifer for neuropathy     HPI: Slipped at home and twisted toe back and the skin on top of the left big toe broke. This is day 6. She has been using neosporin and the toe still swelled and became very red.  Past Medical History  Diagnosis Date  . Anxiety   . Hypertension    History reviewed. No pertinent past surgical history. History   Social History  . Marital Status: Widowed    Spouse Name: N/A    Number of Children: N/A  . Years of Education: N/A   Occupational History  . Not on file.   Social History Main Topics  . Smoking status: Never Smoker   . Smokeless tobacco: Not on file  . Alcohol Use: No  . Drug Use: No  . Sexually Active: No   Other Topics Concern  . Not on file   Social History Narrative  . No narrative on file   Family History  Problem Relation Age of Onset  . Dementia Father    Current Outpatient Prescriptions on File Prior to Visit  Medication Sig Dispense Refill  . Cholecalciferol (VITAMIN D3 PO) Take 1 tablet by mouth 2 (two) times daily.      Marland Kitchen gabapentin (NEURONTIN) 600 MG tablet Take 600 mg by mouth 2 (two) times daily.      Marland Kitchen HYDROcodone-acetaminophen (VICODIN) 5-500 MG per tablet Take 1 tablet by mouth 2 (two) times daily as needed for pain.      . Iron TABS Take 1 tablet by mouth daily.      Marland Kitchen lidocaine (LIDODERM) 5 %       . LORazepam (ATIVAN) 0.5 MG tablet Take 0.5 mg by mouth daily as needed for anxiety.      Marland Kitchen morphine (MS CONTIN) 15 MG 12 hr tablet Take 15 mg by mouth 2 (two) times daily.      . OXYTROL 3.9 MG/24HR APPLY 1 PATCH 2 TIMES A WEEK AS DIRECTED  8 patch  0  . psyllium (HYDROCIL/METAMUCIL) 95 % PACK Take 1 packet by mouth 2 (two) times daily as needed (constipation).      . sertraline  (ZOLOFT) 50 MG tablet Take 50 mg by mouth daily.      . valsartan (DIOVAN) 40 MG tablet Take 40 mg by mouth daily.       No current facility-administered medications on file prior to visit.   Allergies  Allergen Reactions  . Penicillins    Immunization History  Administered Date(s) Administered  . Influenza Whole 03/28/2007   Prior to Admission medications   Medication Sig Start Date End Date Taking? Authorizing Provider  mupirocin ointment (BACTROBAN) 2 % Apply topically 3 (three) times daily.   Yes Historical Provider, MD  Cholecalciferol (VITAMIN D3 PO) Take 1 tablet by mouth 2 (two) times daily.    Historical Provider, MD  clindamycin (CLEOCIN) 300 MG capsule Take 1 capsule (300 mg total) by mouth 3 (three) times daily. 01/28/13   Ileana Ladd, MD  gabapentin (NEURONTIN) 600 MG tablet Take 600 mg by mouth 2 (two) times daily.    Historical Provider, MD  HYDROcodone-acetaminophen (VICODIN) 5-500 MG per tablet Take 1 tablet by mouth 2 (two) times daily as  needed for pain.    Historical Provider, MD  Iron TABS Take 1 tablet by mouth daily.    Historical Provider, MD  lidocaine (LIDODERM) 5 %  12/31/12   Historical Provider, MD  LORazepam (ATIVAN) 0.5 MG tablet Take 0.5 mg by mouth daily as needed for anxiety.    Historical Provider, MD  morphine (MS CONTIN) 15 MG 12 hr tablet Take 15 mg by mouth 2 (two) times daily.    Historical Provider, MD  mupirocin ointment (BACTROBAN) 2 % Apply topically 3 (three) times daily. 01/28/13 02/04/13  Ileana Ladd, MD  OXYTROL 3.9 MG/24HR APPLY 1 PATCH 2 TIMES A WEEK AS DIRECTED 11/09/12   Ernestina Penna, MD  psyllium (HYDROCIL/METAMUCIL) 95 % PACK Take 1 packet by mouth 2 (two) times daily as needed (constipation).    Historical Provider, MD  sertraline (ZOLOFT) 50 MG tablet Take 50 mg by mouth daily.    Historical Provider, MD  sulfamethoxazole-trimethoprim (BACTRIM DS) 800-160 MG per tablet Take 1 tablet by mouth 2 (two) times daily. 01/28/13   Ileana Ladd, MD  valsartan (DIOVAN) 40 MG tablet Take 40 mg by mouth daily.    Historical Provider, MD     ROS: As above in the HPI. All other systems are stable or negative.  OBJECTIVE: APPEARANCE:  Patient in no acute distress.The patient appeared well nourished and normally developed. Acyanotic. Waist: VITAL SIGNS:BP 132/73  Pulse 73  Temp(Src) 97.7 F (36.5 C) (Oral)  Wt 127 lb 3.2 oz (57.698 kg)  BMI 24.05 kg/m2 Elderly WF   SKIN: warm and  Dry. Horizontal laceration on top of the left great toe with fatty tissue exposed. The toe has redness and warmth on the top with a red streak from the toe to the ankle. Demarcation with a skin pen.  HEAD and Neck: without JVD, Head and scalp: normal Eyes:No scleral icterus. Fundi normal, eye movements normal. Ears: Auricle normal, canal normal, Tympanic membranes normal, insufflation normal. Nose: normal Throat: normal Neck & thyroid: normal  CHEST & LUNGS: Chest wall: normal Lungs: Clear  CVS: Reveals the PMI to be normally located. Regular rhythm, First and Second Heart sounds are normal,  absence of murmurs, rubs or gallops. Peripheral vasculature: Radial pulses: normal Dorsal pedis pulses: normal Posterior pulses: normal  EXTREMETIES: nonedematous. Both Femoral and Pedal pulses are normal.   NEUROLOGIC: oriented to time,place and person; nonfocal.  ASSESSMENT:  Contusion, toe, left, initial encounter - Plan: DG Foot Complete Left  Cellulitis - Plan: POCT CBC, Aerobic culture, mupirocin ointment (BACTROBAN) 2 %, sulfamethoxazole-trimethoprim (BACTRIM DS) 800-160 MG per tablet, clindamycin (CLEOCIN) 300 MG capsule   PLAN: [facility] reading (PRIMARY) by  Dr. Modesto Charon: no fractures or osteomyelitis seen.     Orders Placed This Encounter  Procedures  . Aerobic culture  . DG Foot Complete Left    Standing Status: Future     Number of Occurrences: 1     Standing Expiration Date: 03/30/2014    Order Specific Question:  Reason  for Exam (SYMPTOM  OR DIAGNOSIS REQUIRED)    Answer:  injury to big toe now swollen and red.    Order Specific Question:  Preferred imaging location?    Answer:  Internal  . POCT CBC    Meds ordered this encounter  Medications  . mupirocin ointment (BACTROBAN) 2 %    Sig: Apply topically 3 (three) times daily.  . mupirocin ointment (BACTROBAN) 2 %    Sig: Apply topically 3 (three) times  daily.    Dispense:  30 g    Refill:  0  . sulfamethoxazole-trimethoprim (BACTRIM DS) 800-160 MG per tablet    Sig: Take 1 tablet by mouth 2 (two) times daily.    Dispense:  20 tablet    Refill:  0  . clindamycin (CLEOCIN) 300 MG capsule    Sig: Take 1 capsule (300 mg total) by mouth 3 (three) times daily.    Dispense:  30 capsule    Refill:  0    Results for orders placed in visit on 01/28/13  POCT CBC      Result Value Range   WBC 9.6  4.6 - 10.2 K/uL   Lymph, poc 1.1  0.6 - 3.4   POC LYMPH PERCENT 11.0  10 - 50 %L   POC Granulocyte 8.1 (*) 2 - 6.9   Granulocyte percent 84.5 (*) 37 - 80 %G   RBC 4.1  4.04 - 5.48 M/uL   Hemoglobin 11.8 (*) 12.2 - 16.2 g/dL   HCT, POC 40.9 (*) 81.1 - 47.9 %   MCV 85.8  80 - 97 fL   MCH, POC 28.4  27 - 31.2 pg   MCHC 33.1  31.8 - 35.4 g/dL   RDW, POC 91.4     Platelet Count, POC 179.0  142 - 424 K/uL   MPV 8.6  0 - 99.8 fL   Return in about 1 day (around 01/29/2013) for Recheck medical problems.  Cortlynn Hollinsworth P. Modesto Charon, M.D.

## 2013-01-29 ENCOUNTER — Encounter: Payer: Self-pay | Admitting: Family Medicine

## 2013-01-29 ENCOUNTER — Ambulatory Visit (INDEPENDENT_AMBULATORY_CARE_PROVIDER_SITE_OTHER): Payer: Medicare Other | Admitting: Family Medicine

## 2013-01-29 VITALS — BP 142/81 | HR 85 | Temp 98.7°F | Wt 126.8 lb

## 2013-01-29 DIAGNOSIS — S9780XA Crushing injury of unspecified foot, initial encounter: Secondary | ICD-10-CM | POA: Insufficient documentation

## 2013-01-29 DIAGNOSIS — S91119A Laceration without foreign body of unspecified toe without damage to nail, initial encounter: Secondary | ICD-10-CM | POA: Insufficient documentation

## 2013-01-29 DIAGNOSIS — L0390 Cellulitis, unspecified: Secondary | ICD-10-CM

## 2013-01-29 DIAGNOSIS — S90122D Contusion of left lesser toe(s) without damage to nail, subsequent encounter: Secondary | ICD-10-CM

## 2013-01-29 DIAGNOSIS — S91119D Laceration without foreign body of unspecified toe without damage to nail, subsequent encounter: Secondary | ICD-10-CM

## 2013-01-29 DIAGNOSIS — L0291 Cutaneous abscess, unspecified: Secondary | ICD-10-CM

## 2013-01-29 DIAGNOSIS — Z5189 Encounter for other specified aftercare: Secondary | ICD-10-CM

## 2013-01-29 NOTE — Progress Notes (Signed)
Patient ID: [name], female   DOB: [birth year], 79 y.o.   MRN: [id number] SUBJECTIVE: CC: Chief Complaint  Patient presents with  . Follow-up    1 day reck left toe    HPI: Recheck of injury, laceration and cellulitis involving the left big toe and extending as a streak to the ankle. Thinks it is better. The toe that the radiologist reports as a fracture is totally asymptomatic.  Past Medical History  Diagnosis Date  . Anxiety   . Hypertension    No past surgical history on file. Current Outpatient Prescriptions on File Prior to Visit  Medication Sig Dispense Refill  . Cholecalciferol (VITAMIN D3 PO) Take 1 tablet by mouth 2 (two) times daily.      . clindamycin (CLEOCIN) 300 MG capsule Take 1 capsule (300 mg total) by mouth 3 (three) times daily.  30 capsule  0  . gabapentin (NEURONTIN) 600 MG tablet Take 600 mg by mouth 2 (two) times daily.      Marland Kitchen HYDROcodone-acetaminophen (VICODIN) 5-500 MG per tablet Take 1 tablet by mouth 2 (two) times daily as needed for pain.      . Iron TABS Take 1 tablet by mouth daily.      Marland Kitchen lidocaine (LIDODERM) 5 %       . LORazepam (ATIVAN) 0.5 MG tablet Take 0.5 mg by mouth daily as needed for anxiety.      Marland Kitchen morphine (MS CONTIN) 15 MG 12 hr tablet Take 15 mg by mouth 2 (two) times daily.      . mupirocin ointment (BACTROBAN) 2 % Apply topically 3 (three) times daily.      . mupirocin ointment (BACTROBAN) 2 % Apply topically 3 (three) times daily.  30 g  0  . OXYTROL 3.9 MG/24HR APPLY 1 PATCH 2 TIMES A WEEK AS DIRECTED  8 patch  0  . psyllium (HYDROCIL/METAMUCIL) 95 % PACK Take 1 packet by mouth 2 (two) times daily as needed (constipation).      . sertraline (ZOLOFT) 50 MG tablet Take 50 mg by mouth daily.      Marland Kitchen sulfamethoxazole-trimethoprim (BACTRIM DS) 800-160 MG per tablet Take 1 tablet by mouth 2 (two) times daily.  20 tablet  0  . valsartan (DIOVAN) 40 MG tablet Take 40 mg by mouth daily.       No current facility-administered  medications on file prior to visit.   Allergies  Allergen Reactions  . Penicillins    Immunization History  Administered Date(s) Administered  . Influenza Whole 03/28/2007   History   Social History  . Marital Status: Widowed    Spouse Name: N/A    Number of Children: N/A  . Years of Education: N/A   Occupational History  . Not on file.   Social History Main Topics  . Smoking status: Never Smoker   . Smokeless tobacco: Not on file  . Alcohol Use: No  . Drug Use: No  . Sexually Active: No   Other Topics Concern  . Not on file   Social History Narrative  . No narrative on file      ROS: As above in the HPI. All other systems are stable or negative.  OBJECTIVE: APPEARANCE:  Patient in no acute distress.The patient appeared well nourished and normally developed. Acyanotic. Waist: VITAL SIGNS:BP 142/81  Pulse 85  Temp(Src) 98.7 F (37.1 C) (Oral)  Wt 126 lb 12.8 oz (57.516 kg)  BMI 23.97 kg/m2 Elderly WF ina wheelchair.   SKIN:  warm and  Dry without overt rashes, tattoos and scars  HEAD and Neck: without JVD, Head and scalp: normal Eyes:No scleral icterus. Fundi normal, eye movements normal. Ears: Auricle normal, canal normal, Tympanic membranes normal, insufflation normal. Nose: normal Throat: normal Neck & thyroid: normal  CHEST & LUNGS: Chest wall: normal Lungs: Clear   EXTREMETIES: nonedematous. Both Femoral and Pedal pulses are normal.  See photo in media of her foot. The swelling is trace. The red streak has resolved. There is no tenderness today. The laceration on the dorsum of the foot is dry. The redness is still there.  Results for orders placed in visit on 01/28/13  POCT CBC      Result Value Range   WBC 9.6  4.6 - 10.2 K/uL   Lymph, poc 1.1  0.6 - 3.4   POC LYMPH PERCENT 11.0  10 - 50 %L   POC Granulocyte 8.1 (*) 2 - 6.9   Granulocyte percent 84.5 (*) 37 - 80 %G   RBC 4.1  4.04 - 5.48 M/uL   Hemoglobin 11.8 (*) 12.2 - 16.2 g/dL    HCT, POC 16.1 (*) 09.6 - 47.9 %   MCV 85.8  80 - 97 fL   MCH, POC 28.4  27 - 31.2 pg   MCHC 33.1  31.8 - 35.4 g/dL   RDW, POC 04.5     Platelet Count, POC 179.0  142 - 424 K/uL   MPV 8.6  0 - 99.8 fL    ASSESSMENT: Cellulitis - a little better in 24 hours.  Laceration of toe of left foot, subsequent encounter  Contusion, toe, left, subsequent encounter  PLAN:  Same medications. Elevate ,rest  Daily cleansing  Return in about 2 days (around 01/31/2013) for recheck foot.Thelma Barge P. [place] Charon, M.D.

## 2013-01-30 LAB — AEROBIC CULTURE

## 2013-01-31 ENCOUNTER — Ambulatory Visit (INDEPENDENT_AMBULATORY_CARE_PROVIDER_SITE_OTHER): Payer: Medicare Other | Admitting: Family Medicine

## 2013-01-31 ENCOUNTER — Encounter: Payer: Self-pay | Admitting: Family Medicine

## 2013-01-31 VITALS — BP 149/77 | HR 74 | Temp 97.9°F | Wt 126.8 lb

## 2013-01-31 DIAGNOSIS — Z5189 Encounter for other specified aftercare: Secondary | ICD-10-CM

## 2013-01-31 DIAGNOSIS — L0291 Cutaneous abscess, unspecified: Secondary | ICD-10-CM

## 2013-01-31 DIAGNOSIS — S90122D Contusion of left lesser toe(s) without damage to nail, subsequent encounter: Secondary | ICD-10-CM

## 2013-01-31 DIAGNOSIS — L0390 Cellulitis, unspecified: Secondary | ICD-10-CM

## 2013-01-31 NOTE — Progress Notes (Signed)
Quick Note:  Labs abnormal. Wound culture grew MRSA. Keep follow up. The antibiotics should clear it up. ______

## 2013-01-31 NOTE — Progress Notes (Signed)
Patient ID: [name], female   DOB: 07-30-1924, 79 y.o.   MRN: [id number] SUBJECTIVE: CC: Chief Complaint  Patient presents with  . Follow-up    reck toe    HPI: Thinks it is better. No fever, no drainage. No problems with the antibiotics.  Past Medical History  Diagnosis Date  . Anxiety   . Hypertension    No past surgical history on file. History   Social History  . Marital Status: Widowed    Spouse Name: N/A    Number of Children: N/A  . Years of Education: N/A   Occupational History  . Not on file.   Social History Main Topics  . Smoking status: Never Smoker   . Smokeless tobacco: Not on file  . Alcohol Use: No  . Drug Use: No  . Sexually Active: No   Other Topics Concern  . Not on file   Social History Narrative  . No narrative on file   Family History  Problem Relation Age of Onset  . Dementia Father    Current Outpatient Prescriptions on File Prior to Visit  Medication Sig Dispense Refill  . Cholecalciferol (VITAMIN D3 PO) Take 1 tablet by mouth 2 (two) times daily.      . ciprofloxacin (CIPRO) 250 MG tablet       . clindamycin (CLEOCIN) 300 MG capsule Take 1 capsule (300 mg total) by mouth 3 (three) times daily.  30 capsule  0  . gabapentin (NEURONTIN) 600 MG tablet Take 600 mg by mouth 2 (two) times daily.      Marland Kitchen HYDROcodone-acetaminophen (VICODIN) 5-500 MG per tablet Take 1 tablet by mouth 2 (two) times daily as needed for pain.      . Iron TABS Take 1 tablet by mouth daily.      Marland Kitchen lidocaine (LIDODERM) 5 %       . LORazepam (ATIVAN) 0.5 MG tablet Take 0.5 mg by mouth daily as needed for anxiety.      Marland Kitchen morphine (MS CONTIN) 15 MG 12 hr tablet Take 15 mg by mouth 2 (two) times daily.      . mupirocin ointment (BACTROBAN) 2 % Apply topically 3 (three) times daily.      . mupirocin ointment (BACTROBAN) 2 % Apply topically 3 (three) times daily.  30 g  0  . OXYTROL 3.9 MG/24HR APPLY 1 PATCH 2 TIMES A WEEK AS DIRECTED  8 patch  0  . psyllium  (HYDROCIL/METAMUCIL) 95 % PACK Take 1 packet by mouth 2 (two) times daily as needed (constipation).      . sertraline (ZOLOFT) 50 MG tablet Take 50 mg by mouth daily.      Marland Kitchen sulfamethoxazole-trimethoprim (BACTRIM DS) 800-160 MG per tablet Take 1 tablet by mouth 2 (two) times daily.  20 tablet  0  . valsartan (DIOVAN) 40 MG tablet Take 40 mg by mouth daily.       No current facility-administered medications on file prior to visit.   Allergies  Allergen Reactions  . Penicillins    Immunization History  Administered Date(s) Administered  . Influenza Whole 03/28/2007   Prior to Admission medications   Medication Sig Start Date End Date Taking? Authorizing Provider  Cholecalciferol (VITAMIN D3 PO) Take 1 tablet by mouth 2 (two) times daily.   Yes Historical Provider, MD  ciprofloxacin (CIPRO) 250 MG tablet  12/31/12  Yes Historical Provider, MD  clindamycin (CLEOCIN) 300 MG capsule Take 1 capsule (300 mg total) by mouth 3 (three)  times daily. 01/28/13  Yes Ileana Ladd, MD  gabapentin (NEURONTIN) 600 MG tablet Take 600 mg by mouth 2 (two) times daily.   Yes Historical Provider, MD  HYDROcodone-acetaminophen (VICODIN) 5-500 MG per tablet Take 1 tablet by mouth 2 (two) times daily as needed for pain.   Yes Historical Provider, MD  Iron TABS Take 1 tablet by mouth daily.   Yes Historical Provider, MD  lidocaine (LIDODERM) 5 %  12/31/12  Yes Historical Provider, MD  LORazepam (ATIVAN) 0.5 MG tablet Take 0.5 mg by mouth daily as needed for anxiety.   Yes Historical Provider, MD  morphine (MS CONTIN) 15 MG 12 hr tablet Take 15 mg by mouth 2 (two) times daily.   Yes Historical Provider, MD  mupirocin ointment (BACTROBAN) 2 % Apply topically 3 (three) times daily.   Yes Historical Provider, MD  mupirocin ointment (BACTROBAN) 2 % Apply topically 3 (three) times daily. 01/28/13 02/04/13 Yes Ileana Ladd, MD  OXYTROL 3.9 MG/24HR APPLY 1 PATCH 2 TIMES A WEEK AS DIRECTED 11/09/12  Yes Ernestina Penna, MD   psyllium (HYDROCIL/METAMUCIL) 95 % PACK Take 1 packet by mouth 2 (two) times daily as needed (constipation).   Yes Historical Provider, MD  sertraline (ZOLOFT) 50 MG tablet Take 50 mg by mouth daily.   Yes Historical Provider, MD  sulfamethoxazole-trimethoprim (BACTRIM DS) 800-160 MG per tablet Take 1 tablet by mouth 2 (two) times daily. 01/28/13  Yes Ileana Ladd, MD  valsartan (DIOVAN) 40 MG tablet Take 40 mg by mouth daily.   Yes Historical Provider, MD     ROS: As above in the HPI. All other systems are stable or negative.  OBJECTIVE: APPEARANCE:  Patient in no acute distress.The patient appeared well nourished and normally developed. Acyanotic. Waist: VITAL SIGNS:BP 149/77  Pulse 74  Temp(Src) 97.9 F (36.6 C) (Oral)  Wt 126 lb 12.8 oz (57.516 kg)  BMI 23.97 kg/m2 WF In wheelchair  SKIN: warm and  Dry without overt rashes, tattoos and scars  HEAD and Neck: without JVD, Head and scalp: normal Eyes:No scleral icterus. Fundi normal, eye movements normal. Ears: Auricle normal, canal normal, Tympanic membranes normal, insufflation normal. Nose: normal Throat: normal Neck & thyroid: normal  CHEST & LUNGS: Chest wall: normal Lungs: Clear  CVS: Reveals the PMI to be normally located. Regular rhythm, First and Second Heart sounds are normal,  absence of murmurs, rubs or gallops. Peripheral vasculature: Radial pulses: normal Dorsal pedis pulses: normal Posterior pulses: normal  ABDOMEN:  Appearance: normal Benign, no organomegaly, no masses, no Abdominal Aortic enlargement. No Guarding , no rebound. No Bruits. Bowel sounds: normal  RECTAL: N/A GU: N/A  EXTREMETIES: see photo in Media unit of EP[facility]   ASSESSMENT: Cellulitis  Contusion, toe, left, subsequent encounter  Improving slowly. The laceration will take a prolonged time to heal due to size wound and the infection.  PLAN:  Continue with the 2 antibiotics and local wound care. Stop using neosporin  and use the bactroban as instructed.  Return for Recheck medical problems. In 1 week  Terrianne Cavness P. [place] Charon, M.D.

## 2013-02-07 ENCOUNTER — Encounter: Payer: Self-pay | Admitting: Family Medicine

## 2013-02-07 ENCOUNTER — Ambulatory Visit (INDEPENDENT_AMBULATORY_CARE_PROVIDER_SITE_OTHER): Payer: Medicare Other

## 2013-02-07 ENCOUNTER — Ambulatory Visit (INDEPENDENT_AMBULATORY_CARE_PROVIDER_SITE_OTHER): Payer: Medicare Other | Admitting: Family Medicine

## 2013-02-07 VITALS — BP 122/80 | HR 87 | Temp 98.4°F | Wt 124.0 lb

## 2013-02-07 DIAGNOSIS — Z22322 Carrier or suspected carrier of Methicillin resistant Staphylococcus aureus: Secondary | ICD-10-CM | POA: Insufficient documentation

## 2013-02-07 DIAGNOSIS — L0291 Cutaneous abscess, unspecified: Secondary | ICD-10-CM

## 2013-02-07 DIAGNOSIS — L0390 Cellulitis, unspecified: Secondary | ICD-10-CM

## 2013-02-07 DIAGNOSIS — S90122D Contusion of left lesser toe(s) without damage to nail, subsequent encounter: Secondary | ICD-10-CM

## 2013-02-07 DIAGNOSIS — Z5189 Encounter for other specified aftercare: Secondary | ICD-10-CM

## 2013-02-07 MED ORDER — SULFAMETHOXAZOLE-TMP DS 800-160 MG PO TABS: 1.0000 | ORAL_TABLET | Freq: Two times a day (BID) | ORAL | Status: DC

## 2013-02-07 NOTE — Progress Notes (Signed)
Patient ID: [name], female   DOB: 09-10-1924, 79 y.o.   MRN: [id number] SUBJECTIVE: CC: Chief Complaint  Patient presents with  . Follow-up    reck left toe     HPI: Came for follow up. It looks better. Still very sore. No drainage, no fever. Daughter thinks it is doing well  Past Medical History  Diagnosis Date  . Anxiety   . Hypertension    No past surgical history on file. Current Outpatient Prescriptions on File Prior to Visit  Medication Sig Dispense Refill  . Cholecalciferol (VITAMIN D3 PO) Take 1 tablet by mouth 2 (two) times daily.      Marland Kitchen gabapentin (NEURONTIN) 600 MG tablet Take 600 mg by mouth 2 (two) times daily.      Marland Kitchen HYDROcodone-acetaminophen (VICODIN) 5-500 MG per tablet Take 1 tablet by mouth 2 (two) times daily as needed for pain.      . Iron TABS Take 1 tablet by mouth daily.      Marland Kitchen lidocaine (LIDODERM) 5 %       . LORazepam (ATIVAN) 0.5 MG tablet Take 0.5 mg by mouth daily as needed for anxiety.      Marland Kitchen morphine (MS CONTIN) 15 MG 12 hr tablet Take 15 mg by mouth 2 (two) times daily.      . mupirocin ointment (BACTROBAN) 2 % Apply topically 3 (three) times daily.      . OXYTROL 3.9 MG/24HR APPLY 1 PATCH 2 TIMES A WEEK AS DIRECTED  8 patch  0  . psyllium (HYDROCIL/METAMUCIL) 95 % PACK Take 1 packet by mouth 2 (two) times daily as needed (constipation).      . sertraline (ZOLOFT) 50 MG tablet Take 50 mg by mouth daily.      Marland Kitchen sulfamethoxazole-trimethoprim (BACTRIM DS) 800-160 MG per tablet Take 1 tablet by mouth 2 (two) times daily.  20 tablet  0  . valsartan (DIOVAN) 40 MG tablet Take 40 mg by mouth daily.       No current facility-administered medications on file prior to visit.   Allergies  Allergen Reactions  . Penicillins    Immunization History  Administered Date(s) Administered  . Influenza Whole 03/28/2007   History   Social History  . Marital Status: Widowed    Spouse Name: N/A    Number of Children: N/A  . Years of Education: N/A    Occupational History  . Not on file.   Social History Main Topics  . Smoking status: Never Smoker   . Smokeless tobacco: Not on file  . Alcohol Use: No  . Drug Use: No  . Sexual Activity: No   Other Topics Concern  . Not on file   Social History Narrative  . No narrative on file    ROS: As above in the HPI. All other systems are stable or negative.  OBJECTIVE: APPEARANCE:  Patient in no acute distress.The patient appeared well nourished and normally developed. Acyanotic. Waist: VITAL SIGNS:BP 122/80  Pulse 87  Temp(Src) 98.4 F (36.9 C) (Oral)  Wt 124 lb (56.246 kg)  BMI 23.44 kg/m2   SKIN: warm and  Dry without overt rashes, tattoos and scars. See extremity exam   HEAD and Neck: without JVD, Head and scalp: normal Eyes:No scleral icterus. Fundi normal, eye movements normal. Ears: Auricle normal, canal normal, Tympanic membranes normal, insufflation normal. Nose: normal Throat: normal Neck & thyroid: normal  CHEST & LUNGS: Chest wall: normal Lungs: Clear  CVS: Reveals the PMI to be  normally located. Regular rhythm, First and Second Heart sounds are normal,  absence of murmurs, rubs or gallops. Peripheral vasculature: Radial pulses: normal Dorsal pedis pulses: normal Posterior pulses: normal  EXTREMETIES: nonedematous. Both Femoral and Pedal pulses are normal.The left big toe has less redness. The wound is dry the horizontal laceration is gaping. The cellulitis is 50% better. There is no streaking. The wound area is tender.. No loculations or fluctuance.  NEUROLOGIC: oriented to time,place and person; nonfocal.  ASSESSMENT: Cellulitis - Plan: DG Foot Complete Left, sulfamethoxazole-trimethoprim (BACTRIM DS) 800-160 MG per tablet  Contusion, toe, left, subsequent encounter - Plan: DG Foot Complete Left, sulfamethoxazole-trimethoprim (BACTRIM DS) 800-160 MG per tablet  MRSA (methicillin resistant staph aureus) culture positive  PLAN: Orders  Placed This Encounter  Procedures  . DG Foot Complete Left    Standing Status: Future     Number of Occurrences: 1     Standing Expiration Date: 04/09/2014    Order Specific Question:  Reason for Exam (SYMPTOM  OR DIAGNOSIS REQUIRED)    Answer:  follow up to r/o osteomyelitis, cellulitis    Order Specific Question:  Preferred imaging location?    Answer:  Internal   [facility] reading (PRIMARY) by  Dr. Modesto Charon : no osteomyelitis nor bony abnormality of the big toe seen.  Daily wound care  Wound clean and  Dressed today.  Continue antibiotics for the MRSA infection    Return in about 11 days (around 02/18/2013) for Recheck medical problems.  Graceyn Fodor P. [place] Charon, M.D.

## 2013-02-18 ENCOUNTER — Encounter: Payer: Self-pay | Admitting: Family Medicine

## 2013-02-18 ENCOUNTER — Ambulatory Visit (INDEPENDENT_AMBULATORY_CARE_PROVIDER_SITE_OTHER): Payer: Medicare Other | Admitting: Family Medicine

## 2013-02-18 VITALS — BP 161/66 | HR 62 | Temp 97.9°F | Wt 125.4 lb

## 2013-02-18 DIAGNOSIS — Z5189 Encounter for other specified aftercare: Secondary | ICD-10-CM

## 2013-02-18 DIAGNOSIS — S91119D Laceration without foreign body of unspecified toe without damage to nail, subsequent encounter: Secondary | ICD-10-CM

## 2013-02-18 DIAGNOSIS — S90122D Contusion of left lesser toe(s) without damage to nail, subsequent encounter: Secondary | ICD-10-CM

## 2013-02-18 DIAGNOSIS — L0291 Cutaneous abscess, unspecified: Secondary | ICD-10-CM

## 2013-02-18 DIAGNOSIS — L0390 Cellulitis, unspecified: Secondary | ICD-10-CM

## 2013-02-18 NOTE — Progress Notes (Signed)
Patient ID: [name], female   DOB: 04-24-1925, 79 y.o.   MRN: [id number] SUBJECTIVE: CC: Chief Complaint  Patient presents with  . Follow-up    RECK TOE LEFT GREAT     HPI: Doing better. No pain.  Past Medical History  Diagnosis Date  . Anxiety   . Hypertension    No past surgical history on file. History   Social History  . Marital Status: Widowed    Spouse Name: N/A    Number of Children: N/A  . Years of Education: N/A   Occupational History  . Not on file.   Social History Main Topics  . Smoking status: Never Smoker   . Smokeless tobacco: Not on file  . Alcohol Use: No  . Drug Use: No  . Sexual Activity: No   Other Topics Concern  . Not on file   Social History Narrative  . No narrative on file   Family History  Problem Relation Age of Onset  . Dementia Father    Current Outpatient Prescriptions on File Prior to Visit  Medication Sig Dispense Refill  . Cholecalciferol (VITAMIN D3 PO) Take 1 tablet by mouth 2 (two) times daily.      Marland Kitchen gabapentin (NEURONTIN) 600 MG tablet Take 600 mg by mouth 2 (two) times daily.      Marland Kitchen HYDROcodone-acetaminophen (VICODIN) 5-500 MG per tablet Take 1 tablet by mouth 2 (two) times daily as needed for pain.      . Iron TABS Take 1 tablet by mouth daily.      Marland Kitchen lidocaine (LIDODERM) 5 %       . LORazepam (ATIVAN) 0.5 MG tablet Take 0.5 mg by mouth daily as needed for anxiety.      Marland Kitchen morphine (MS CONTIN) 15 MG 12 hr tablet Take 15 mg by mouth 2 (two) times daily.      . mupirocin ointment (BACTROBAN) 2 % Apply topically 3 (three) times daily.      . OXYTROL 3.9 MG/24HR APPLY 1 PATCH 2 TIMES A WEEK AS DIRECTED  8 patch  0  . psyllium (HYDROCIL/METAMUCIL) 95 % PACK Take 1 packet by mouth 2 (two) times daily as needed (constipation).      . sertraline (ZOLOFT) 50 MG tablet Take 50 mg by mouth daily.      Marland Kitchen sulfamethoxazole-trimethoprim (BACTRIM DS) 800-160 MG per tablet Take 1 tablet by mouth 2 (two) times daily.  20 tablet   0  . valsartan (DIOVAN) 40 MG tablet Take 40 mg by mouth daily.       No current facility-administered medications on file prior to visit.   Allergies  Allergen Reactions  . Penicillins    Immunization History  Administered Date(s) Administered  . Influenza Whole 03/28/2007   Prior to Admission medications   Medication Sig Start Date End Date Taking? Authorizing Provider  Cholecalciferol (VITAMIN D3 PO) Take 1 tablet by mouth 2 (two) times daily.   Yes Historical Provider, MD  gabapentin (NEURONTIN) 600 MG tablet Take 600 mg by mouth 2 (two) times daily.   Yes Historical Provider, MD  HYDROcodone-acetaminophen (VICODIN) 5-500 MG per tablet Take 1 tablet by mouth 2 (two) times daily as needed for pain.   Yes Historical Provider, MD  Iron TABS Take 1 tablet by mouth daily.   Yes Historical Provider, MD  lidocaine (LIDODERM) 5 %  12/31/12  Yes Historical Provider, MD  LORazepam (ATIVAN) 0.5 MG tablet Take 0.5 mg by mouth daily as needed for  anxiety.   Yes Historical Provider, MD  morphine (MS CONTIN) 15 MG 12 hr tablet Take 15 mg by mouth 2 (two) times daily.   Yes Historical Provider, MD  mupirocin ointment (BACTROBAN) 2 % Apply topically 3 (three) times daily.   Yes Historical Provider, MD  OXYTROL 3.9 MG/24HR APPLY 1 PATCH 2 TIMES A WEEK AS DIRECTED 11/09/12  Yes Ernestina Penna, MD  psyllium (HYDROCIL/METAMUCIL) 95 % PACK Take 1 packet by mouth 2 (two) times daily as needed (constipation).   Yes Historical Provider, MD  sertraline (ZOLOFT) 50 MG tablet Take 50 mg by mouth daily.   Yes Historical Provider, MD  sulfamethoxazole-trimethoprim (BACTRIM DS) 800-160 MG per tablet Take 1 tablet by mouth 2 (two) times daily. 02/07/13  Yes Ileana Ladd, MD  valsartan (DIOVAN) 40 MG tablet Take 40 mg by mouth daily.   Yes Historical Provider, MD     ROS: As above in the HPI. All other systems are stable or negative.  OBJECTIVE: APPEARANCE:  Patient in no acute distress.The patient appeared  well nourished and normally developed. Acyanotic. Waist: VITAL SIGNS:BP 161/66  Pulse 62  Temp(Src) 97.9 F (36.6 C) (Oral)  Wt 125 lb 6.4 oz (56.881 kg)  BMI 23.71 kg/m2 Elderly WF  SKIN: warm and  Dry.wound now dry and nontender. Healing nicely now.  HEAD and Neck: without JVD, Head and scalp: normal Eyes:No scleral icterus. Fundi normal, eye movements normal. Ears: Auricle normal, canal normal, Tympanic membranes normal, insufflation normal. Nose: normal Throat: normal Neck & thyroid: normal  CHEST & LUNGS: Chest wall: normal Lungs: Clear  CVS: Reveals the PMI to be normally located. Regular rhythm, First and Second Heart sounds are normal,  absence of murmurs, rubs or gallops. Peripheral vasculature: Radial pulses: normal Dorsal pedis pulses: normal Posterior pulses: normal  ABDOMEN:  Appearance: normal Benign, no organomegaly, no masses, no Abdominal Aortic enlargement. No Guarding , no rebound. No Bruits. Bowel sounds: normal  RECTAL: N/A GU: N/A  EXTREMETIES: nonedematous.  ASSESSMENT: Cellulitis  Contusion, toe, left, subsequent encounter  Laceration of toe of left foot, subsequent encounter  Improved and at the point where she can Return prn or routinely  PLAN: No change in wound care.  Return in about 6 weeks (around 04/01/2013) for Recheck medical problems.  Chanc Kervin P. [place] Charon, M.D.

## 2013-02-27 ENCOUNTER — Other Ambulatory Visit: Payer: Self-pay | Admitting: Family Medicine

## 2013-03-06 ENCOUNTER — Telehealth: Payer: Self-pay | Admitting: Internal Medicine

## 2013-03-06 NOTE — Telephone Encounter (Signed)
Looked in pt chart. She has not been seen since 2008 by MW.  I called and spoke with pt daughter. I advised her pt will need to be seen before refills can be sent. She is scheduled to see MW next week. Nothing further needed

## 2013-03-13 ENCOUNTER — Institutional Professional Consult (permissible substitution): Payer: Medicare Other | Admitting: Internal Medicine

## 2013-04-02 ENCOUNTER — Ambulatory Visit (INDEPENDENT_AMBULATORY_CARE_PROVIDER_SITE_OTHER): Payer: Medicare Other | Admitting: Family Medicine

## 2013-04-02 ENCOUNTER — Encounter: Payer: Self-pay | Admitting: Family Medicine

## 2013-04-02 VITALS — BP 142/77 | HR 66 | Temp 98.1°F | Ht 59.0 in | Wt 131.8 lb

## 2013-04-02 DIAGNOSIS — K219 Gastro-esophageal reflux disease without esophagitis: Secondary | ICD-10-CM

## 2013-04-02 DIAGNOSIS — M48062 Spinal stenosis, lumbar region with neurogenic claudication: Secondary | ICD-10-CM | POA: Insufficient documentation

## 2013-04-02 DIAGNOSIS — M419 Scoliosis, unspecified: Secondary | ICD-10-CM | POA: Insufficient documentation

## 2013-04-02 DIAGNOSIS — G8929 Other chronic pain: Secondary | ICD-10-CM

## 2013-04-02 DIAGNOSIS — J45909 Unspecified asthma, uncomplicated: Secondary | ICD-10-CM

## 2013-04-02 DIAGNOSIS — Z23 Encounter for immunization: Secondary | ICD-10-CM

## 2013-04-02 DIAGNOSIS — M4120 Other idiopathic scoliosis, site unspecified: Secondary | ICD-10-CM

## 2013-04-02 NOTE — Progress Notes (Signed)
Patient ID: [name], female   DOB: Dec 18, 1924, 79 y.o.   MRN: [id number] SUBJECTIVE: CC: Chief Complaint  Patient presents with  . Follow-up    6 wk ck up     HPI: Here to establish with Dr Modesto Charon  Here to recheck the cellulitis of her foot and to recheck her medical problems. Her toe and foot infection has totally resolved.  Chronic pain especially the legs.  And lower back. Has severe degenerative disease of the spine with biconvex thoracolumbar spine as the cause of the the back pain and neurogenic claudication. Patient declines surgical intervention because of age and health and the care is more comfort care  Incontinence of urine: uses oxytrol. It helps uses depends.  Past Medical History  Diagnosis Date  . Anxiety   . Hypertension    No past surgical history on file. History   Social History  . Marital Status: Widowed    Spouse Name: N/A    Number of Children: N/A  . Years of Education: N/A   Occupational History  . Not on file.   Social History Main Topics  . Smoking status: Never Smoker   . Smokeless tobacco: Not on file  . Alcohol Use: No  . Drug Use: No  . Sexual Activity: No   Other Topics Concern  . Not on file   Social History Narrative  . No narrative on file   Family History  Problem Relation Age of Onset  . Dementia Father    Current Outpatient Prescriptions on File Prior to Visit  Medication Sig Dispense Refill  . Cholecalciferol (VITAMIN D3 PO) Take 1 tablet by mouth 2 (two) times daily.      Marland Kitchen gabapentin (NEURONTIN) 600 MG tablet Take 600 mg by mouth 2 (two) times daily.      . Iron TABS Take 1 tablet by mouth daily.      Marland Kitchen lidocaine (LIDODERM) 5 % Place 0.5 patches onto the skin daily.       . OXYTROL 3.9 MG/24HR APPLY 1 PATCH 2 TIMES A WEEK AS DIRECTED  8 patch  4  . psyllium (HYDROCIL/METAMUCIL) 95 % PACK Take 1 packet by mouth 2 (two) times daily as needed (constipation).      . sertraline (ZOLOFT) 50 MG tablet Take 50 mg by  mouth daily.       No current facility-administered medications on file prior to visit.   Allergies  Allergen Reactions  . Penicillins    Immunization History  Administered Date(s) Administered  . Influenza Whole 03/28/2007  . Influenza,inj,Quad PF,36+ Mos 04/02/2013   Prior to Admission medications   Medication Sig Start Date End Date Taking? Authorizing Provider  HYDROcodone-acetaminophen (NORCO) 7.5-325 MG per tablet Take 1 tablet by mouth every 6 (six) hours as needed for pain.   Yes Historical Provider, MD  lidocaine (LIDODERM) 5 % Place 1 patch onto the skin daily. Remove & Discard patch within 12 hours or as directed by MD   Yes Historical Provider, MD  Cholecalciferol (VITAMIN D3 PO) Take 1 tablet by mouth 2 (two) times daily.    Historical Provider, MD  gabapentin (NEURONTIN) 600 MG tablet Take 600 mg by mouth 2 (two) times daily.    Historical Provider, MD  Iron TABS Take 1 tablet by mouth daily.    Historical Provider, MD  lidocaine (LIDODERM) 5 % Place 0.5 patches onto the skin daily.  12/31/12   Historical Provider, MD  OXYTROL 3.9 MG/24HR APPLY 1 PATCH 2  TIMES A WEEK AS DIRECTED 02/27/13   Ernestina Penna, MD  psyllium (HYDROCIL/METAMUCIL) 95 % PACK Take 1 packet by mouth 2 (two) times daily as needed (constipation).    Historical Provider, MD  sertraline (ZOLOFT) 50 MG tablet Take 50 mg by mouth daily.    Historical Provider, MD    ROS: As above in the HPI. All other systems are stable or negative.  OBJECTIVE: APPEARANCE:  Patient in no acute distress.The patient appeared well nourished and normally developed. Acyanotic. Waist: VITAL SIGNS:BP 142/77  Pulse 66  Temp(Src) 98.1 F (36.7 C) (Oral)  Ht 4\' 11"  (1.499 m)  Wt 131 lb 12.8 oz (59.784 kg)  BMI 26.61 kg/m2 Elderly WF ina  Wheelchair   SKIN: warm and  Dry without overt rashes, tattoos and scars  HEAD and Neck: without JVD, Head and scalp: normal Eyes:No scleral icterus. Fundi normal, eye movements  normal. Ears: Auricle normal, canal normal, Tympanic membranes normal, insufflation normal. Nose: normal Throat: normal Neck & thyroid: normal  CHEST & LUNGS: Chest wall: normal Lungs: Clear  CVS: Reveals the PMI to be normally located. Regular rhythm, First and Second Heart sounds are normal,  absence of murmurs, rubs or gallops. Peripheral vasculature: Radial pulses: normal Dorsal pedis pulses:dimished Posterior pulses: diminished  ABDOMEN:  Appearance: normal Benign, no organomegaly, no masses, no Abdominal Aortic enlargement. No Guarding , no rebound. No Bruits. Bowel sounds: normal  RECTAL: N/A GU: N/A  EXTREMETIES: nonedematous.  MUSCULOSKELETAL:  Spine: reduced ROM due to pain Joints: kees and ankles intact  NEUROLOGIC: oriented to ,place and person; Strength is 4/5 in the lower extremities Sensory is reduced with paresthesias. Reflexes are reduced Cranial Nerves are normal. Results for orders placed in visit on 01/28/13  AEROBIC CULTURE      Result Value Range   Aerobic Bacterial Culture Final report (*)    Result 1 Staphylococcus aureus (*)    ANTIMICROBIAL SUSCEPTIBILITY Comment    POCT CBC      Result Value Range   WBC 9.6  4.6 - 10.2 K/uL   Lymph, poc 1.1  0.6 - 3.4   POC LYMPH PERCENT 11.0  10 - 50 %L   POC Granulocyte 8.1 (*) 2 - 6.9   Granulocyte percent 84.5 (*) 37 - 80 %G   RBC 4.1  4.04 - 5.48 M/uL   Hemoglobin 11.8 (*) 12.2 - 16.2 g/dL   HCT, POC 40.9 (*) 81.1 - 47.9 %   MCV 85.8  80 - 97 fL   MCH, POC 28.4  27 - 31.2 pg   MCHC 33.1  31.8 - 35.4 g/dL   RDW, POC 91.4     Platelet Count, POC 179.0  142 - 424 K/uL   MPV 8.6  0 - 99.8 fL    ASSESSMENT: Chronic pain - Plan: CMP14+EGFR  Spinal stenosis, lumbar region, with neurogenic claudication  Need for prophylactic vaccination and inoculation against influenza  ASTHMA  Scoliosis  Esophageal reflux - Plan: CMP14+EGFR  The cellulitis of her foot has totally  resolved.  PLAN: Orders Placed This Encounter  Procedures  . CMP14+EGFR    Meds ordered this encounter  Medications  . HYDROcodone-acetaminophen (NORCO) 7.5-325 MG per tablet    Sig: Take 1 tablet by mouth every 6 (six) hours as needed for pain.  Marland Kitchen lidocaine (LIDODERM) 5 %    Sig: Place 1 patch onto the skin daily. Remove & Discard patch within 12 hours or as directed by MD  these meds were  not ordered but updated in the medication list  Keep active. Discussed expectations by patient and the family. Main focus is pain control. Return in about 3 months (around 07/03/2013) for Recheck medical problems.  Danamarie Minami P. Modesto Charon, M.D.

## 2013-04-03 LAB — CMP14+EGFR
ALT: 20 IU/L (ref 0–32)
AST: 25 IU/L (ref 0–40)
Albumin/Globulin Ratio: 2.3 (ref 1.1–2.5)
Albumin: 4.1 g/dL (ref 3.5–4.7)
Alkaline Phosphatase: 113 IU/L (ref 39–117)
BUN/Creatinine Ratio: 18 (ref 11–26)
BUN: 19 mg/dL (ref 8–27)
CO2: 28 mmol/L (ref 18–29)
Calcium: 9.2 mg/dL (ref 8.6–10.2)
Chloride: 103 mmol/L (ref 97–108)
Creatinine, Ser: 1.05 mg/dL — ABNORMAL HIGH (ref 0.57–1.00)
GFR calc Af Amer: 56 mL/min/{1.73_m2} — ABNORMAL LOW (ref >59)
GFR calc non Af Amer: 48 mL/min/{1.73_m2} — ABNORMAL LOW (ref >59)
Globulin, Total: 1.8 g/dL (ref 1.5–4.5)
Glucose: 94 mg/dL (ref 65–99)
Potassium: 4.4 mmol/L (ref 3.5–5.2)
Sodium: 144 mmol/L (ref 134–144)
Total Bilirubin: 0.2 mg/dL (ref 0.0–1.2)
Total Protein: 5.9 g/dL — ABNORMAL LOW (ref 6.0–8.5)

## 2013-04-22 ENCOUNTER — Telehealth: Payer: Self-pay | Admitting: Family Medicine

## 2013-04-23 ENCOUNTER — Other Ambulatory Visit: Payer: Self-pay | Admitting: Family Medicine

## 2013-04-23 MED ORDER — HYDROCODONE-ACETAMINOPHEN 7.5-325 MG PO TABS: 1.0000 | ORAL_TABLET | Freq: Four times a day (QID) | ORAL | Status: DC | PRN

## 2013-04-23 NOTE — Telephone Encounter (Signed)
Aware rx up front

## 2013-04-23 NOTE — Telephone Encounter (Signed)
Rx ready for pick up. 

## 2013-05-02 ENCOUNTER — Ambulatory Visit (INDEPENDENT_AMBULATORY_CARE_PROVIDER_SITE_OTHER): Payer: Medicare Other | Admitting: *Deleted

## 2013-05-02 DIAGNOSIS — E538 Deficiency of other specified B group vitamins: Secondary | ICD-10-CM

## 2013-05-02 MED ORDER — CYANOCOBALAMIN 1000 MCG/ML IJ SOLN: 1000.0000 ug | Freq: Once | INTRAMUSCULAR | Status: DC

## 2013-05-02 MED ORDER — CYANOCOBALAMIN 1000 MCG/ML IJ SOLN
1000.0000 ug | INTRAMUSCULAR | Status: DC
  Administered 2013-05-02 – 2013-11-21 (×3): 1000 ug via INTRAMUSCULAR

## 2013-05-02 NOTE — Progress Notes (Signed)
Pt normally gets Vit B 12 shots at home by Guy Sandifer RN Daughter reports that pts insurance will not pay anymore and our office is trying to set up advanced home care to come and give them. Dr Christell Constant notified and approved Vitamin b12 1,000 mcg/ml every month. Daughter reports last shot has been over a month. Pt tolerated well

## 2013-05-02 NOTE — Patient Instructions (Signed)
Cyanocobalamin, Vitamin B12 injection °What is this medicine? °CYANOCOBALAMIN (sye an oh koe BAL a min) is a man made form of vitamin B12. Vitamin B12 is used in the growth of healthy blood cells, nerve cells, and proteins in the body. It also helps with the metabolism of fats and carbohydrates. This medicine is used to treat people who can not absorb vitamin B12. °This medicine may be used for other purposes; ask your health care provider or pharmacist if you have questions. °COMMON BRAND NAME(S): Cyomin, LA-12 , Nutri-Twelve , Primabalt °What should I tell my health care provider before I take this medicine? °They need to know if you have any of these conditions: °-kidney disease °-Leber's disease °-megaloblastic anemia °-an unusual or allergic reaction to cyanocobalamin, cobalt, other medicines, foods, dyes, or preservatives °-pregnant or trying to get pregnant °-breast-feeding °How should I use this medicine? °This medicine is injected into a muscle or deeply under the skin. It is usually given by a health care professional in a clinic or doctor's office. However, your doctor may teach you how to inject yourself. Follow all instructions. °Talk to your pediatrician regarding the use of this medicine in children. Special care may be needed. °Overdosage: If you think you have taken too much of this medicine contact a poison control center or emergency room at once. °NOTE: This medicine is only for you. Do not share this medicine with others. °What if I miss a dose? °If you are given your dose at a clinic or doctor's office, call to reschedule your appointment. If you give your own injections and you miss a dose, take it as soon as you can. If it is almost time for your next dose, take only that dose. Do not take double or extra doses. °What may interact with this medicine? °-colchicine °-heavy alcohol intake °This list may not describe all possible interactions. Give your health care provider a list of all the  medicines, herbs, non-prescription drugs, or dietary supplements you use. Also tell them if you smoke, drink alcohol, or use illegal drugs. Some items may interact with your medicine. °What should I watch for while using this medicine? °Visit your doctor or health care professional regularly. You may need blood work done while you are taking this medicine. °You may need to follow a special diet. Talk to your doctor. Limit your alcohol intake and avoid smoking to get the best benefit. °What side effects may I notice from receiving this medicine? °Side effects that you should report to your doctor or health care professional as soon as possible: °-allergic reactions like skin rash, itching or hives, swelling of the face, lips, or tongue °-blue tint to skin °-chest tightness, pain °-difficulty breathing, wheezing °-dizziness °-red, swollen painful area on the leg °Side effects that usually do not require medical attention (report to your doctor or health care professional if they continue or are bothersome): °-diarrhea °-headache °This list may not describe all possible side effects. Call your doctor for medical advice about side effects. You may report side effects to FDA at 1-800-FDA-1088. °Where should I keep my medicine? °Keep out of the reach of children. °Store at room temperature between 15 and 30 degrees C (59 and 85 degrees F). Protect from light. Throw away any unused medicine after the expiration date. °NOTE: This sheet is a summary. It may not cover all possible information. If you have questions about this medicine, talk to your doctor, pharmacist, or health care provider. °© 2014, [employer]. (2007-09-24   22:10:20) ° °

## 2013-05-13 ENCOUNTER — Telehealth: Payer: Self-pay | Admitting: Family Medicine

## 2013-05-13 MED ORDER — HYDROCODONE-ACETAMINOPHEN 7.5-325 MG PO TABS: 1.0000 | ORAL_TABLET | Freq: Four times a day (QID) | ORAL | Status: DC | PRN

## 2013-05-13 NOTE — Telephone Encounter (Signed)
rx ready for pickup 

## 2013-05-13 NOTE — Telephone Encounter (Signed)
Pt aware rx ready for pick up. 

## 2013-05-21 ENCOUNTER — Ambulatory Visit (INDEPENDENT_AMBULATORY_CARE_PROVIDER_SITE_OTHER): Payer: Medicare Other | Admitting: Internal Medicine

## 2013-05-21 ENCOUNTER — Ambulatory Visit (INDEPENDENT_AMBULATORY_CARE_PROVIDER_SITE_OTHER)
Admission: RE | Admit: 2013-05-21 | Discharge: 2013-05-21 | Disposition: A | Discharge status: Home / Self Care | Payer: Medicare Other | Source: Ambulatory Visit | Attending: Internal Medicine | Admitting: Internal Medicine

## 2013-05-21 ENCOUNTER — Encounter: Payer: Self-pay | Admitting: Internal Medicine

## 2013-05-21 VITALS — BP 126/72 | HR 65 | Temp 98.4°F | Ht 62.0 in | Wt 138.0 lb

## 2013-05-21 DIAGNOSIS — K219 Gastro-esophageal reflux disease without esophagitis: Secondary | ICD-10-CM

## 2013-05-21 DIAGNOSIS — J45909 Unspecified asthma, uncomplicated: Secondary | ICD-10-CM

## 2013-05-21 MED ORDER — ARFORMOTEROL TARTRATE 15 MCG/2ML IN NEBU: 15.0000 ug | INHALATION_SOLUTION | Freq: Two times a day (BID) | RESPIRATORY_TRACT | Status: DC

## 2013-05-21 MED ORDER — BUDESONIDE 0.5 MG/2ML IN SUSP: 0.5000 mg | Freq: Two times a day (BID) | RESPIRATORY_TRACT | Status: DC

## 2013-05-21 NOTE — Progress Notes (Signed)
Subjective:    Patient ID: [name], female    DOB: 28-Oct-1924   MRN: [id number]  HPI  Primary Provider/Referring Provider: Dr. Modesto Charon   Brief patient profile:  [id number] never smoker, with new onset asthma like symptoms Dec 2007 she did undergo PFTs 03/09/2007 indicating very mild airflow obstruction with an FEV1 improved 20% after bronchodilators and a normal diffusing capacity c/w asthma and responded nicely to LABA/ICU nebs as couldn't use hfa effectively      History of Present Illness  September 23, 2008 - 2 week follow up visit and med calendar check. Pt began Brovana and Budesonide 2 weeks ago; Symbicort was D/C'ed. Pt states that she "has never felt better and was able to sleep through the night" after using the Brovana and Budesonide neb tx.    May 28, 2009 ov no c/o's but had 55 min < 89% sat by overnight pulse ox. no nocturnal co's but only using neb once in am, not in pm > declined 02    05/21/2013  "new pt"ov/[name] re: re-establish / no longer using med calendar Chief Complaint  Patient presents with  . Pulmonary Consult    Pt last seen 2010. She states having increased wheezing and SOB for the past 2 wks.   Not on ppi at all though denies overt HB or dysphagia  Worse since neb stopped working and relying on intermittent neb rx per son.  No obvious other patterns in  day to day or daytime variabilty or assoc chronic cough or cp or chest tightness, subjective wheeze overt sinus or hb symptoms. No unusual exp hx or h/o childhood pna/ asthma or knowledge of premature birth.  Sleeping ok without nocturnal  or early am exacerbation  of respiratory  c/o's or need for noct saba. Also denies any obvious fluctuation of symptoms with weather or environmental changes or other aggravating or alleviating factors except as outlined above   Current Medications, Allergies, Complete Past Medical History, Past Surgical History, Family History, and Social History were reviewed in  [facility] record.  ROS  The following are not active complaints unless bolded sore throat, dysphagia, dental problems, itching, sneezing,  nasal congestion or excess/ purulent secretions, ear ache,   fever, chills, sweats, unintended wt loss, pleuritic or exertional cp, hemoptysis,  orthopnea pnd or leg swelling, presyncope, palpitations, heartburn, abdominal pain, anorexia, nausea, vomiting, diarrhea  or change in bowel or urinary habits, change in stools or urine, dysuria,hematuria,  rash, arthralgias, visual complaints, headache, numbness weakness or ataxia related to neuropathy or problems with walking or coordination,  change in mood/affect or memory.       Past Medical History:  ESOPHAGEAL REFLUX (ICD-530.81)  ASTHMA (ICD-493.90).................................................Marland KitchenWert        Review of Systems  Constitutional: Negative for fever, chills and unexpected weight change.  HENT: Positive for congestion. Negative for dental problem, ear pain, nosebleeds, postnasal drip, rhinorrhea, sinus pressure, sneezing, sore throat, trouble swallowing and voice change.   Eyes: Negative for visual disturbance.  Respiratory: Positive for shortness of breath. Negative for cough and choking.   Cardiovascular: Negative for chest pain and leg swelling.  Gastrointestinal: Negative for vomiting, abdominal pain and diarrhea.  Genitourinary: Negative for difficulty urinating.  Musculoskeletal: Negative for arthralgias.  Skin: Negative for rash.  Neurological: Negative for tremors, syncope and headaches.  Hematological: Does not bruise/bleed easily.       Objective:   Physical Exam W/c bound elderly wf weathered facies nad  Wt Readings from  Last 3 Encounters:  05/21/13 138 lb (62.596 kg)  04/02/13 131 lb 12.8 oz (59.784 kg)  02/18/13 125 lb 6.4 oz (56.881 kg)       wt 119 December 08, 2008 > 123 May 28, 2009     HEENT:  Edentulous with dentures in place  nl  turbinates, and orophanx. Nl external ear canals without cough reflex   NECK :  without JVD/Nodes/TM/ nl carotid upstrokes bilaterally   LUNGS: no acc muscle use, clear to A and P bilaterally without cough on insp or exp maneuvers   CV:  RRR  no s3 or murmur or increase in P2, no edema   ABD:  soft and nontender with nl excursion in the supine position. No bruits or organomegaly, bowel sounds nl  MS:  warm without deformities, calf tenderness, cyanosis or clubbing  SKIN: warm and dry without lesions    NEURO:  alert, approp, no deficits    CXR  05/21/2013 :    And opacity at the right lung base just above the hemidiaphragm appears to be within the anterior right 5th rib and is stable compared to prior chest x-rays, most likely bony in origin. The left hemidiaphragm remains slightly elevated. No infiltrate or effusion is seen. Mild cardiomegaly is stable. There are degenerative changes throughout the thoracic spine.     Assessment & Plan:

## 2013-05-21 NOTE — Patient Instructions (Signed)
For any flare of cough / wheeze/ short breath > Try prilosec 20mg   Take 30-60 min before first meal of the day and Pepcid 20 mg one bedtime until better for at least a week then stop  GERD (REFLUX)  is an extremely common cause of respiratory symptoms, many times with no significant heartburn at all.    It can be treated with medication, but also with lifestyle changes including avoidance of late meals, excessive alcohol, smoking cessation, and avoid fatty foods, chocolate, peppermint, colas, red wine, and acidic juices such as orange juice.  NO MINT OR MENTHOL PRODUCTS SO NO COUGH DROPS  USE SUGARLESS CANDY INSTEAD (jolley ranchers or Stover's)  NO OIL BASED VITAMINS - use powdered substitutes.   Please see patient coordinator before you leave today  to schedule home and neb and your medications (part B Medicare)   Please remember to go to the   x-ray department downstairs for your tests - we will call you with the results when they are available.

## 2013-05-22 NOTE — Assessment & Plan Note (Signed)
-   PFTs 03/09/2007 indicating very mild airflow obstruction with an FEV1 improved 20% after bronchodilators and a normal diffusing capacity   Does great as long as she has access to meds with the caveat that although LABA's aren't approved for asthma, she's basically on the equivalent of symbicort, which is approved for both asthma and copd.  If insurance won't pay for brovana the only other option is advair 250/50 bid    Each maintenance medication was reviewed in detail including most importantly the difference between maintenance and as needed and under what circumstances the prns are to be used.  Please see instructions for details which were reviewed in writing and the patient given a copy.

## 2013-05-22 NOTE — Assessment & Plan Note (Addendum)
No active dz apparent at present but Explained natural history of uri and other triggers for uacs (upper airway cough syndrome)  and why it's necessary in patients at risk to treat GERD aggressively  at least  short term  to reduce risk of evolving cyclical cough initially  triggered by epithelial injury and a heightened sensitivty to the effects of any upper airway irritants,  most importantly acid - related.  That is, the more sensitive the epithelium damaged for virus, the more the cough, the more the secondary reflux (especially in those prone to reflux) the more the irritation of the sensitive mucosa and so on in a cyclical pattern.   See instructions for specific recommendations which were reviewed directly with the patient who was given a copy with highlighter outlining the key components.

## 2013-05-29 ENCOUNTER — Telehealth: Payer: Self-pay | Admitting: Family Medicine

## 2013-05-30 ENCOUNTER — Other Ambulatory Visit: Payer: Self-pay | Admitting: Family Medicine

## 2013-05-30 MED ORDER — HYDROCODONE-ACETAMINOPHEN 7.5-325 MG PO TABS: 1.0000 | ORAL_TABLET | Freq: Two times a day (BID) | ORAL | Status: DC | PRN

## 2013-05-30 NOTE — Telephone Encounter (Signed)
Rx ready for pick up. 

## 2013-05-30 NOTE — Telephone Encounter (Signed)
[name] notified -rx hydrocodone ready for pick up

## 2013-06-11 ENCOUNTER — Other Ambulatory Visit: Payer: Self-pay | Admitting: Family Medicine

## 2013-06-11 ENCOUNTER — Telehealth: Payer: Self-pay | Admitting: Family Medicine

## 2013-06-11 NOTE — Telephone Encounter (Signed)
Was refilled 05/30/2013

## 2013-06-13 ENCOUNTER — Other Ambulatory Visit: Payer: Self-pay | Admitting: Family Medicine

## 2013-06-13 DIAGNOSIS — G894 Chronic pain syndrome: Secondary | ICD-10-CM

## 2013-06-13 MED ORDER — HYDROCODONE-ACETAMINOPHEN 7.5-325 MG PO TABS: 1.0000 | ORAL_TABLET | Freq: Two times a day (BID) | ORAL | Status: DC | PRN

## 2013-07-04 ENCOUNTER — Encounter: Payer: Self-pay | Admitting: Family Medicine

## 2013-07-04 ENCOUNTER — Ambulatory Visit (INDEPENDENT_AMBULATORY_CARE_PROVIDER_SITE_OTHER): Payer: Medicare Other | Admitting: Family Medicine

## 2013-07-04 VITALS — BP 170/89 | HR 59 | Temp 97.4°F | Ht 62.0 in | Wt 136.6 lb

## 2013-07-04 DIAGNOSIS — G8929 Other chronic pain: Secondary | ICD-10-CM

## 2013-07-04 DIAGNOSIS — N3281 Overactive bladder: Secondary | ICD-10-CM | POA: Insufficient documentation

## 2013-07-04 DIAGNOSIS — M48062 Spinal stenosis, lumbar region with neurogenic claudication: Secondary | ICD-10-CM

## 2013-07-04 DIAGNOSIS — E538 Deficiency of other specified B group vitamins: Secondary | ICD-10-CM

## 2013-07-04 DIAGNOSIS — K219 Gastro-esophageal reflux disease without esophagitis: Secondary | ICD-10-CM

## 2013-07-04 DIAGNOSIS — M419 Scoliosis, unspecified: Secondary | ICD-10-CM

## 2013-07-04 DIAGNOSIS — Z23 Encounter for immunization: Secondary | ICD-10-CM | POA: Insufficient documentation

## 2013-07-04 DIAGNOSIS — J45909 Unspecified asthma, uncomplicated: Secondary | ICD-10-CM

## 2013-07-04 DIAGNOSIS — N318 Other neuromuscular dysfunction of bladder: Secondary | ICD-10-CM

## 2013-07-04 DIAGNOSIS — M4120 Other idiopathic scoliosis, site unspecified: Secondary | ICD-10-CM

## 2013-07-04 MED ORDER — SOLIFENACIN SUCCINATE 5 MG PO TABS: 5.0000 mg | ORAL_TABLET | Freq: Every day | ORAL | Status: DC

## 2013-07-04 NOTE — Progress Notes (Signed)
Tolerated tdap without difficulty 

## 2013-07-04 NOTE — Progress Notes (Signed)
Patient ID: [name], female   DOB: [birth year], 80 y.o.   MRN: [id number] SUBJECTIVE: CC: Chief Complaint  Patient presents with  . Follow-up    3 month follow up chronic problelms states her hydrocodone was cut down to bid and needs different other rx than oxytrol insurrance does not cover    HPI: Patient is here for follow up of hypertension: denies Headache;deniesChest Pain;denies weakness;denies Shortness of Breath or Orthopnea;denies Visual changes;denies palpitations;denies cough;denies pedal edema;denies symptoms of TIA or stroke; admits to Compliance with medications.used to be on BP meds over 1 year ago and it was d/c because her bP went too low. She has been eating a lot of salty chips. denies Problems with medications. Break through pain and uses OTC NSAID in the middle of the day. Has OAB.and Urinary incontinence. oxytrol is no longer covered. Needs alternative. Chronic pain in legs.  Past Medical History  Diagnosis Date  . Anxiety   . Hypertension   . OAB (overactive bladder)    No past surgical history on file. History   Social History  . Marital Status: Widowed    Spouse Name: N/A    Number of Children: N/A  . Years of Education: N/A   Occupational History  . Not on file.   Social History Main Topics  . Smoking status: Never Smoker   . Smokeless tobacco: Not on file  . Alcohol Use: No  . Drug Use: No  . Sexual Activity: No   Other Topics Concern  . Not on file   Social History Narrative  . No narrative on file   Family History  Problem Relation Age of Onset  . Dementia Father   . Asthma Sister   . Asthma Mother    Current Outpatient Prescriptions on File Prior to Visit  Medication Sig Dispense Refill  . arformoterol (BROVANA) 15 MCG/2ML NEBU Take 2 mLs (15 mcg total) by nebulization 2 (two) times daily.  120 mL  11  . budesonide (PULMICORT) 0.5 MG/2ML nebulizer solution Take 2 mLs (0.5 mg total) by nebulization 2 (two) times daily.  120  mL  11  . Cholecalciferol (VITAMIN D3 PO) Take 1 tablet by mouth 2 (two) times daily.      . ciprofloxacin (CIPRO) 250 MG tablet Take 250 mg by mouth every 3 (three) days.      . Cyanocobalamin (VITAMIN B-12 IJ) 1 injection every 30 days      . gabapentin (NEURONTIN) 600 MG tablet Take 600 mg by mouth 3 (three) times daily.       Marland Kitchen HYDROcodone-acetaminophen (NORCO) 7.5-325 MG per tablet Take 1 tablet by mouth 2 (two) times daily as needed.  60 tablet  0  . Iron TABS Take 65 mg by mouth daily.       Marland Kitchen lidocaine (LIDODERM) 5 % Place 1 patch onto the skin daily. Remove & Discard patch within 12 hours or as directed by MD      . psyllium (HYDROCIL/METAMUCIL) 95 % PACK Take 1 packet by mouth daily.       . sertraline (ZOLOFT) 50 MG tablet Take 50 mg by mouth daily.       Current Facility-Administered Medications on File Prior to Visit  Medication Dose Route Frequency Provider Last Rate Last Dose  . cyanocobalamin ((VITAMIN B-12)) injection 1,000 mcg  1,000 mcg Intramuscular Q30 days Chipper Herb, MD   1,000 mcg at 07/04/13 1300   Allergies  Allergen Reactions  . Penicillins  Immunization History  Administered Date(s) Administered  . Influenza Whole 03/28/2007  . Influenza,inj,Quad PF,36+ Mos 04/02/2013  . Tdap 07/04/2013   Prior to Admission medications   Medication Sig Start Date End Date Taking? Authorizing Provider  arformoterol (BROVANA) 15 MCG/2ML NEBU Take 2 mLs (15 mcg total) by nebulization 2 (two) times daily. 05/21/13   Tanda Rockers, MD  budesonide (PULMICORT) 0.5 MG/2ML nebulizer solution Take 2 mLs (0.5 mg total) by nebulization 2 (two) times daily. 05/21/13   Tanda Rockers, MD  Cholecalciferol (VITAMIN D3 PO) Take 1 tablet by mouth 2 (two) times daily.    Historical Provider, MD  ciprofloxacin (CIPRO) 250 MG tablet Take 250 mg by mouth every 3 (three) days.    Historical Provider, MD  Cyanocobalamin (VITAMIN B-12 IJ) 1 injection every 30 days    Historical Provider, MD   gabapentin (NEURONTIN) 600 MG tablet Take 600 mg by mouth 3 (three) times daily.     Historical Provider, MD  HYDROcodone-acetaminophen (NORCO) 7.5-325 MG per tablet Take 1 tablet by mouth 2 (two) times daily as needed. 06/13/13   Vernie Shanks, MD  Iron TABS Take 65 mg by mouth daily.     Historical Provider, MD  lidocaine (LIDODERM) 5 % Place 1 patch onto the skin daily. Remove & Discard patch within 12 hours or as directed by MD    Historical Provider, MD  OXYTROL 3.9 MG/24HR APPLY 1 PATCH 2 TIMES A WEEK AS DIRECTED 02/27/13   Chipper Herb, MD  psyllium (HYDROCIL/METAMUCIL) 95 % PACK Take 1 packet by mouth daily.     Historical Provider, MD  sertraline (ZOLOFT) 50 MG tablet Take 50 mg by mouth daily.    Historical Provider, MD     ROS: As above in the HPI. All other systems are stable or negative.  OBJECTIVE: APPEARANCE:  Patient in no acute distress.The patient appeared well nourished and normally developed. Acyanotic. Waist: VITAL SIGNS:BP 170/89  Pulse 59  Temp(Src) 97.4 F (36.3 C) (Oral)  Ht 5' 2"  (1.575 m)  Wt 136 lb 9.6 oz (61.961 kg)  BMI 24.98 kg/m2  Elderly frail WF in a wheelchair.  SKIN: warm and  Dry without overt rashes, tattoos and scars  HEAD and Neck: without JVD, Head and scalp: normal Eyes:No scleral icterus. Fundi normal, eye movements normal. Ears: Auricle normal, canal normal, Tympanic membranes normal, insufflation normal. Nose: normal Throat: normal Neck & thyroid: normal  CHEST & LUNGS: Chest wall: normal Lungs: Clear  CVS: Reveals the PMI to be normally located. Regular rhythm, First and Second Heart sounds are normal,  absence of murmurs, rubs or gallops. Peripheral vasculature: Radial pulses: normal Dorsal pedis pulses: normal Posterior pulses: normal  ABDOMEN:  Appearance: obese Benign, no organomegaly, no masses, no Abdominal Aortic enlargement. No Guarding , no rebound. No Bruits. Bowel sounds: normal  RECTAL: N/A GU:  N/A  EXTREMETIES: nonedematous.  MUSCULOSKELETAL:  Spine: reduced ROM. With pain on movemeny   NEUROLOGIC: oriented to,place and person;  Strength is decreased 4/5 in the lower extremities Sensory is reduced Reflexes are reduced Cranial Nerves are normal. Results for orders placed in visit on 04/02/13  CMP14+EGFR      Result Value Range   Glucose 94  65 - 99 mg/dL   BUN 19  8 - 27 mg/dL   Creatinine, Ser 1.05 (*) 0.57 - 1.00 mg/dL   GFR calc non Af Amer 48 (*) >59 mL/min/1.73   GFR calc Af Amer 56 (*) >59 mL/min/1.73  BUN/Creatinine Ratio 18  11 - 26   Sodium 144  134 - 144 mmol/L   Potassium 4.4  3.5 - 5.2 mmol/L   Chloride 103  97 - 108 mmol/L   CO2 28  18 - 29 mmol/L   Calcium 9.2  8.6 - 10.2 mg/dL   Total Protein 5.9 (*) 6.0 - 8.5 g/dL   Albumin 4.1  3.5 - 4.7 g/dL   Globulin, Total 1.8  1.5 - 4.5 g/dL   Albumin/Globulin Ratio 2.3  1.1 - 2.5   Total Bilirubin 0.2  0.0 - 1.2 mg/dL   Alkaline Phosphatase 113  39 - 117 IU/L   AST 25  0 - 40 IU/L   ALT 20  0 - 32 IU/L    ASSESSMENT: Spinal stenosis, lumbar region, with neurogenic claudication  Need for Tdap vaccination - Plan: Tdap vaccine greater than or equal to 7yo IM  ASTHMA  Chronic pain - Plan: CMP14+EGFR  Esophageal reflux  Scoliosis  OAB (overactive bladder) - Plan: solifenacin (VESICARE) 5 MG tablet, CMP14+EGFR bp is elevated. She has been normotensive Recheck BP was 170/89  PLAN:  Sodium restriction. Will recheck BP tomorrow AND if elevated will treat. Discussed with p[atient and family. Do not desire any surgical intervention or procedural intervention in the spine. Comfort care is desired by patient and family. Orders Placed This Encounter  Procedures  . Tdap vaccine greater than or equal to 7yo IM  . CMP14+EGFR   Meds ordered this encounter  Medications  . solifenacin (VESICARE) 5 MG tablet    Sig: Take 1 tablet (5 mg total) by mouth daily.    Dispense:  30 tablet    Refill:  3    Medications Discontinued During This Encounter  Medication Reason  . OXYTROL 3.9 MG/24HR Not covered by the pt's insurance  try an aleve in the middle of the day. If no pain relief , consider increase to tid.   Return in about 1 day (around 07/05/2013), or BP check tomorrow with triage, for Recheck medical problems in 2 months.Dub Mikes P. Jacelyn Grip, M.D.

## 2013-07-04 NOTE — Patient Instructions (Signed)

## 2013-07-05 ENCOUNTER — Other Ambulatory Visit: Payer: Medicare Other

## 2013-07-05 LAB — CMP14+EGFR
ALT: 17 IU/L (ref 0–32)
AST: 22 IU/L (ref 0–40)
Albumin/Globulin Ratio: 2.7 — ABNORMAL HIGH (ref 1.1–2.5)
Albumin: 4.6 g/dL (ref 3.5–4.7)
Alkaline Phosphatase: 112 IU/L (ref 39–117)
BUN/Creatinine Ratio: 16 (ref 11–26)
BUN: 17 mg/dL (ref 8–27)
CO2: 25 mmol/L (ref 18–29)
Calcium: 9.6 mg/dL (ref 8.6–10.2)
Chloride: 104 mmol/L (ref 97–108)
Creatinine, Ser: 1.09 mg/dL — ABNORMAL HIGH (ref 0.57–1.00)
GFR calc Af Amer: 53 mL/min/{1.73_m2} — ABNORMAL LOW (ref >59)
GFR calc non Af Amer: 46 mL/min/{1.73_m2} — ABNORMAL LOW (ref >59)
Globulin, Total: 1.7 g/dL (ref 1.5–4.5)
Glucose: 92 mg/dL (ref 65–99)
Potassium: 4.8 mmol/L (ref 3.5–5.2)
Sodium: 143 mmol/L (ref 134–144)
Total Bilirubin: 0.4 mg/dL (ref 0.0–1.2)
Total Protein: 6.3 g/dL (ref 6.0–8.5)

## 2013-07-10 ENCOUNTER — Telehealth: Payer: Self-pay | Admitting: Family Medicine

## 2013-07-11 ENCOUNTER — Other Ambulatory Visit: Payer: Self-pay | Admitting: Family Medicine

## 2013-07-11 DIAGNOSIS — G894 Chronic pain syndrome: Secondary | ICD-10-CM

## 2013-07-11 MED ORDER — HYDROCODONE-ACETAMINOPHEN 7.5-325 MG PO TABS: 1.0000 | ORAL_TABLET | Freq: Two times a day (BID) | ORAL | Status: DC | PRN

## 2013-07-12 ENCOUNTER — Encounter (HOSPITAL_COMMUNITY): Payer: Self-pay | Admitting: Emergency Medicine

## 2013-07-12 ENCOUNTER — Emergency Department (HOSPITAL_COMMUNITY)
Admission: EM | Admit: 2013-07-12 | Discharge: 2013-07-12 | Disposition: A | Discharge status: Home / Self Care | Payer: Medicare Other | Attending: Emergency Medicine | Admitting: Emergency Medicine

## 2013-07-12 ENCOUNTER — Ambulatory Visit (INDEPENDENT_AMBULATORY_CARE_PROVIDER_SITE_OTHER): Payer: Medicare Other | Admitting: Family Medicine

## 2013-07-12 ENCOUNTER — Emergency Department (HOSPITAL_COMMUNITY): Payer: Medicare Other

## 2013-07-12 DIAGNOSIS — S0083XA Contusion of other part of head, initial encounter: Secondary | ICD-10-CM

## 2013-07-12 DIAGNOSIS — R42 Dizziness and giddiness: Secondary | ICD-10-CM | POA: Insufficient documentation

## 2013-07-12 DIAGNOSIS — F411 Generalized anxiety disorder: Secondary | ICD-10-CM | POA: Insufficient documentation

## 2013-07-12 DIAGNOSIS — M25559 Pain in unspecified hip: Secondary | ICD-10-CM | POA: Insufficient documentation

## 2013-07-12 DIAGNOSIS — S1093XA Contusion of unspecified part of neck, initial encounter: Secondary | ICD-10-CM

## 2013-07-12 DIAGNOSIS — W108XXA Fall (on) (from) other stairs and steps, initial encounter: Secondary | ICD-10-CM | POA: Insufficient documentation

## 2013-07-12 DIAGNOSIS — R404 Transient alteration of awareness: Secondary | ICD-10-CM | POA: Insufficient documentation

## 2013-07-12 DIAGNOSIS — Z880 Allergy status to penicillin: Secondary | ICD-10-CM | POA: Insufficient documentation

## 2013-07-12 DIAGNOSIS — W19XXXA Unspecified fall, initial encounter: Secondary | ICD-10-CM

## 2013-07-12 DIAGNOSIS — Z79899 Other long term (current) drug therapy: Secondary | ICD-10-CM | POA: Insufficient documentation

## 2013-07-12 DIAGNOSIS — A5211 Tabes dorsalis: Secondary | ICD-10-CM | POA: Insufficient documentation

## 2013-07-12 DIAGNOSIS — S0003XA Contusion of scalp, initial encounter: Secondary | ICD-10-CM | POA: Insufficient documentation

## 2013-07-12 DIAGNOSIS — S0990XA Unspecified injury of head, initial encounter: Secondary | ICD-10-CM

## 2013-07-12 DIAGNOSIS — Y929 Unspecified place or not applicable: Secondary | ICD-10-CM | POA: Insufficient documentation

## 2013-07-12 DIAGNOSIS — N318 Other neuromuscular dysfunction of bladder: Secondary | ICD-10-CM | POA: Insufficient documentation

## 2013-07-12 DIAGNOSIS — T07XXXA Unspecified multiple injuries, initial encounter: Secondary | ICD-10-CM | POA: Insufficient documentation

## 2013-07-12 DIAGNOSIS — Y939 Activity, unspecified: Secondary | ICD-10-CM | POA: Insufficient documentation

## 2013-07-12 DIAGNOSIS — I10 Essential (primary) hypertension: Secondary | ICD-10-CM | POA: Insufficient documentation

## 2013-07-12 HISTORY — DX: Charcot's joint, unspecified site: M14.60

## 2013-07-12 NOTE — ED Notes (Signed)
MD at bedside. Dr. Zackowski. 

## 2013-07-12 NOTE — Progress Notes (Signed)
   Subjective:    Patient ID: Ardelle ParkVesta I Balderrama, female    DOB: 03/26/1925, 80 y.o.   MRN: [id number]  HPI  This 80 y.o. female presents for evaluation of fall and head injury to left side of her scalp last night. She is accomanied by her daughter and is in wheelchair.  Her daughter states she has  Been hard to arouse this am so she wanted to bring her to the doctor.  I am called out of room to see Her urgently.  Review of Systems C/o head injury and decreased mental status   No chest pain, SOB, HA, dizziness, vision change, N/V, diarrhea, constipation, dysuria, urinary urgency or frequency, myalgias, arthralgias or rash.  Objective:   Physical Exam Vital signs noted  Elderly female in wheelchair in NAD.  HEENT - Head - Left Parietal hematoma and area of 3cm diameter with swelling with surrounding                            Ecchymosis and tenderness                Eyes - PERRLA, Conjuctiva - clear Sclera- Clear EOMI Neuro - Grossly intact and alert and tracks        Assessment & Plan:  Head injury, unspecified Recommend she be evaluated in ED and to go now since family do not want transport via EMS. Follow up after ED evaluation and prn.  Daughter pulls in back and patient assisted into POV and duaghter takes her to ED.  Deatra CanterWilliam J Oxford [name]

## 2013-07-12 NOTE — Discharge Instructions (Signed)
Workup for the fall to include both hips negative for fracture head CT CT of face and neck no acute injuries. Can be discharged home followup with her doctor as needed return for any new or worse symptoms.

## 2013-07-12 NOTE — Patient Instructions (Signed)
Head Injury, Adult  You have received a head injury. It does not appear serious at this time. Headaches and vomiting are common following head injury. It should be easy to awaken from sleeping. Sometimes it is necessary for you to stay in the emergency department for a while for observation. Sometimes admission to the hospital may be needed. After injuries such as yours, most problems occur within the first 24 hours, but side effects may occur up to 7 10 days after the injury. It is important for you to carefully monitor your condition and contact your health care provider or seek immediate medical care if there is a change in your condition.  WHAT ARE THE TYPES OF HEAD INJURIES?  Head injuries can be as minor as a bump. Some head injuries can be more severe. More severe head injuries include:  · A jarring injury to the brain (concussion).  · A bruise of the brain (contusion). This mean there is bleeding in the brain that can cause swelling.  · A cracked skull (skull fracture).  · Bleeding in the brain that collects, clots, and forms a bump (hematoma).  WHAT CAUSES A HEAD INJURY?  A serious head injury is most likely to happen to someone who is in a car wreck and is not wearing a seat belt. Other causes of major head injuries include bicycle or motorcycle accidents, sports injuries, and falls.  HOW ARE HEAD INJURIES DIAGNOSED?  A complete history of the event leading to the injury and your current symptoms will be helpful in diagnosing head injuries. Many times, pictures of the brain, such as CT or MRI are needed to see the extent of the injury. Often, an overnight hospital stay is necessary for observation.   WHEN SHOULD I SEEK IMMEDIATE MEDICAL CARE?   You should get help right away if:  · You have confusion or drowsiness.  · You feel sick to your stomach (nauseous) or have continued, forceful vomiting.  · You have dizziness or unsteadiness that is getting worse.  · You have severe, continued headaches not  relieved by medicine. Only take over-the-counter or prescription medicines for pain, fever, or discomfort as directed by your health care provider.  · You do not have normal function of the arms or legs or are unable to walk.  · You notice changes in the black spots in the center of the colored part of your eye (pupil).  · You have a clear or bloody fluid coming from your nose or ears.  · You have a loss of vision.  During the next 24 hours after the injury, you must stay with someone who can watch you for the warning signs. This person should contact local emergency services (911 in the [place] if you have seizures, you become unconscious, or you are unable to wake up.  HOW CAN I PREVENT A HEAD INJURY IN THE FUTURE?  The most important factor for preventing major head injuries is avoiding motor vehicle accidents.  To minimize the potential for damage to your head, it is crucial to wear seat belts while riding in motor vehicles. Wearing helmets while bike riding and playing collision sports (like football) is also helpful. Also, avoiding dangerous activities around the house will further help reduce your risk of head injury.   WHEN CAN I RETURN TO NORMAL ACTIVITIES AND ATHLETICS?  You should be reevaluated by your health care provider before returning to these activities. If you have any of the following symptoms, you should not return   to activities or contact sports until 1 week after the symptoms have stopped:  · Persistent headache.  · Dizziness or vertigo.  · Poor attention and concentration.  · Confusion.  · Memory problems.  · Nausea or vomiting.  · Fatigue or tire easily.  · Irritability.  · Intolerant of bright lights or loud noises.  · Anxiety or depression.  · Disturbed sleep.  MAKE SURE YOU:   · Understand these instructions.  · Will watch your condition.  · Will get help right away if you are not doing well or get worse.  Document Released: 06/13/2005 Document Revised: 04/03/2013 Document Reviewed:  02/18/2013  [employer]® Patient Information ©2014 [employer].

## 2013-07-12 NOTE — ED Notes (Signed)
Verbal Orders per emily west pa/ cindy Terryn Redner

## 2013-07-12 NOTE — ED Notes (Signed)
Fell yesterday on concrete bumped her head hard over left eye she got herself  and went to house, c/o rt hip was able to ambulate after fall, now sore  Denies dizziness felt sleepy this am  Denies loc after fall

## 2013-07-12 NOTE — ED Provider Notes (Addendum)
As always keep in mind the as her story a bilateral or all CSN: [id number]     Arrival date & time 07/12/13  1220 History   First MD Initiated Contact with Patient 07/12/13 1740     No chief complaint on file.  (Consider location/radiation/quality/duration/timing/severity/associated sxs/prior Treatment) The history is provided by the patient.   80 year old female brought in by family member status post fall yesterday landing on her left side today complaining of right hip pain also had a lump on her head there was a questionable brief period of loss of consciousness this morning associated with some dizziness and patient felt sleepy. No distinct prolonged period of loss of consciousness. Patient has no complaints currently other than complaining of right hip pain even though she fell on the left side.     Past Medical History  Diagnosis Date  . Anxiety   . Hypertension   . OAB (overactive bladder)   . Neuropathic arthropathy    No past surgical history on file. Family History  Problem Relation Age of Onset  . Dementia Father   . Asthma Sister   . Asthma Mother    History  Substance Use Topics  . Smoking status: Never Smoker   . Smokeless tobacco: Not on file  . Alcohol Use: No   OB History   Grav Para Term Preterm Abortions TAB SAB Ect Mult Living                 Review of Systems  Constitutional: Negative for fever.  HENT: Negative for congestion.   Eyes: Negative for redness.  Respiratory: Negative for shortness of breath.   Cardiovascular: Positive for chest pain.  Gastrointestinal: Negative for abdominal pain.  Genitourinary: Negative for dysuria.  Musculoskeletal: Negative for back pain and neck pain.  Skin: Negative for rash.  Neurological: Negative for headaches.  Hematological: Does not bruise/bleed easily.  Psychiatric/Behavioral: Negative for confusion.    Allergies  Penicillins  Home Medications   Current Outpatient Rx  Name  Route  Sig  Dispense   Refill  . arformoterol (BROVANA) 15 MCG/2ML NEBU   Nebulization   Take 2 mLs (15 mcg total) by nebulization 2 (two) times daily.   120 mL   11   . budesonide (PULMICORT) 0.5 MG/2ML nebulizer solution   Nebulization   Take 2 mLs (0.5 mg total) by nebulization 2 (two) times daily.   120 mL   11   . Cholecalciferol (VITAMIN D3 PO)   Oral   Take 1 tablet by mouth 2 (two) times daily.         . ciprofloxacin (CIPRO) 250 MG tablet   Oral   Take 250 mg by mouth every 3 (three) days.         . Cyanocobalamin (VITAMIN B-12 IJ)      1 injection every 30 days         . gabapentin (NEURONTIN) 600 MG tablet   Oral   Take 600 mg by mouth 3 (three) times daily.          Marland Kitchen HYDROcodone-acetaminophen (NORCO) 7.5-325 MG per tablet   Oral   Take 1 tablet by mouth 2 (two) times daily as needed.   60 tablet   0   . Iron TABS   Oral   Take 65 mg by mouth daily.          Marland Kitchen lidocaine (LIDODERM) 5 %   Transdermal   Place 1 patch onto the skin daily. Remove &  Discard patch within 12 hours or as directed by MD         . psyllium (HYDROCIL/METAMUCIL) 95 % PACK   Oral   Take 1 packet by mouth daily.          . sertraline (ZOLOFT) 50 MG tablet   Oral   Take 50 mg by mouth daily.          BP 151/91  Pulse 60  Temp(Src) 98.5 F (36.9 C) (Oral)  Resp 22  SpO2 97% Physical Exam  Nursing note and vitals reviewed. Constitutional: She appears well-developed and well-nourished. No distress.  HENT:  Head: Normocephalic.  2 x 2 cm hematoma to the left parietal area. No step off.  Eyes: Conjunctivae and EOM are normal. Pupils are equal, round, and reactive to light.  Neck: Normal range of motion.  Cardiovascular: Normal rate, regular rhythm and normal heart sounds.   No murmur heard. Pulmonary/Chest: Effort normal and breath sounds normal. No respiratory distress.  Abdominal: Soft. Bowel sounds are normal. There is no tenderness.  Musculoskeletal: Normal range of motion.  She exhibits no tenderness.  Neurological: She is alert. No cranial nerve deficit. She exhibits normal muscle tone. Coordination normal.  Skin: Skin is warm. No rash noted.    ED Course  Procedures (including critical care time) Labs Review Labs Reviewed - No data to display Imaging Review Dg Hip Complete Left  07/12/2013   CLINICAL DATA:  Fall  EXAM: LEFT HIP - COMPLETE 2+ VIEW  COMPARISON:  None.  FINDINGS: Negative for hip fracture on the left.  Normal joint space.  Arterial calcification.  Lumbar spondylosis.  IMPRESSION: Negative for fracture.   Electronically Signed   By: Marlan Palau M.D.   On: 07/12/2013 13:29   Dg Hip Complete Right  07/12/2013   CLINICAL DATA:  Patient fell down steps 2 days ago and injured right hip, complaining of right hip pain  EXAM: RIGHT HIP - COMPLETE 2+ VIEW  COMPARISON:  12/08/2007  FINDINGS: Pelvic bones are intact. There is no evidence of femur fracture or dislocation. There is extensive femoral artery calcification.  IMPRESSION: No acute findings.   Electronically Signed   By: Esperanza Heir M.D.   On: 07/12/2013 18:56   Ct Head Wo Contrast  07/12/2013   CLINICAL DATA:  Larey Seat onto face, bruising over left eye.  Dizziness.  EXAM: CT HEAD WITHOUT CONTRAST  CT MAXILLOFACIAL WITHOUT CONTRAST  CT CERVICAL SPINE WITHOUT CONTRAST  TECHNIQUE: Multidetector CT imaging of the head, cervical spine, and maxillofacial structures were performed using the standard protocol without intravenous contrast. Multiplanar CT image reconstructions of the cervical spine and maxillofacial structures were also generated.  COMPARISON:  CT HEAD W/O CM dated 10/21/2012; CT HEAD W/O CM dated 03/09/2010; CT HEAD W/O CM dated 01/09/2009  FINDINGS: CT HEAD FINDINGS  No intracranial hemorrhage. No parenchymal contusion. No midline shift or mass effect. Basilar cisterns are patent. No skull base fracture. No fluid in the paranasal sinuses or mastoid air cells. There is mild generalized cortical  atrophy. There is moderate periventricular and deep white matter white matter hypodensities.  CT MAXILLOFACIAL FINDINGS  No evidence of orbital fracture. The intraconal contents and globes are normal. No evidence of fracture the frontal sinus. The zygomatic arches are intact. The maxillary bone is intact without evidence of fluid in the maxillary sinuses. The pterygoid plates are normal. The mandibular condyles are located. No evidence of mandibular fracture.  CT CERVICAL SPINE FINDINGS  No prevertebral soft  tissue swelling. Normal alignment of cervical vertebral bodies. No loss of vertebral body height. Normal facet articulation. Normal craniocervical junction.  There is fusion of the C3 and C4 vertebral bodies. There is endplate osteophytosis from C2 through T1 with joint space narrowing.  No evidence epidural or paraspinal hematoma.  IMPRESSION: 1. No acute intracranial findings. 2. Atrophy and microvascular disease. 3. No facial bone fracture. 4. No cervical spine fracture. 5. 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   Electronically Signed   By: Genevive BiStewart  Edmunds M.D.   On: 07/12/2013 14:42   Ct Cervical Spine Wo Contrast  07/12/2013   CLINICAL DATA:  Larey SeatFell onto face, bruising over left eye.  Dizziness.  EXAM: CT HEAD WITHOUT CONTRAST  CT MAXILLOFACIAL WITHOUT CONTRAST  CT CERVICAL SPINE WITHOUT CONTRAST  TECHNIQUE: Multidetector CT imaging of the head, cervical spine, and maxillofacial structures were performed using the standard protocol without intravenous contrast. Multiplanar CT image reconstructions of the cervical spine and maxillofacial structures were also generated.  COMPARISON:  CT HEAD W/O CM dated 10/21/2012; CT HEAD W/O CM dated 03/09/2010; CT HEAD W/O CM dated 01/09/2009  FINDINGS: CT HEAD FINDINGS  No intracranial hemorrhage. No parenchymal contusion. No midline shift or mass effect. Basilar cisterns are patent. No skull base fracture. No fluid in the paranasal sinuses or mastoid  air cells. There is mild generalized cortical atrophy. There is moderate periventricular and deep white matter white matter hypodensities.  CT MAXILLOFACIAL FINDINGS  No evidence of orbital fracture. The intraconal contents and globes are normal. No evidence of fracture the frontal sinus. The zygomatic arches are intact. The maxillary bone is intact without evidence of fluid in the maxillary sinuses. The pterygoid plates are normal. The mandibular condyles are located. No evidence of mandibular fracture.  CT CERVICAL SPINE FINDINGS  No prevertebral soft tissue swelling. Normal alignment of cervical vertebral bodies. No loss of vertebral body height. Normal facet articulation. Normal craniocervical junction.  There is fusion of the C3 and C4 vertebral bodies. There is endplate osteophytosis from C2 through T1 with joint space narrowing.  No evidence epidural or paraspinal hematoma.  IMPRESSION: 1. No acute intracranial findings. 2. Atrophy and microvascular disease. 3. No facial bone fracture. 4. No cervical spine fracture. 5. 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   Electronically Signed   By: Genevive BiStewart  Edmunds M.D.   On: 07/12/2013 14:42   Ct Maxillofacial Wo Cm  07/12/2013   CLINICAL DATA:  Larey SeatFell onto face, bruising over left eye.  Dizziness.  EXAM: CT HEAD WITHOUT CONTRAST  CT MAXILLOFACIAL WITHOUT CONTRAST  CT CERVICAL SPINE WITHOUT CONTRAST  TECHNIQUE: Multidetector CT imaging of the head, cervical spine, and maxillofacial structures were performed using the standard protocol without intravenous contrast. Multiplanar CT image reconstructions of the cervical spine and maxillofacial structures were also generated.  COMPARISON:  CT HEAD W/O CM dated 10/21/2012; CT HEAD W/O CM dated 03/09/2010; CT HEAD W/O CM dated 01/09/2009  FINDINGS: CT HEAD FINDINGS  No intracranial hemorrhage. No parenchymal contusion. No midline shift or mass effect. Basilar cisterns are patent. No skull base fracture. No  fluid in the paranasal sinuses or mastoid air cells. There is mild generalized cortical atrophy. There is moderate periventricular and deep white matter white matter hypodensities.  CT MAXILLOFACIAL FINDINGS  No evidence of orbital fracture. The intraconal contents and globes are normal. No evidence of fracture the frontal sinus. The zygomatic arches are intact. The maxillary bone is intact without evidence of fluid in  the maxillary sinuses. The pterygoid plates are normal. The mandibular condyles are located. No evidence of mandibular fracture.  CT CERVICAL SPINE FINDINGS  No prevertebral soft tissue swelling. Normal alignment of cervical vertebral bodies. No loss of vertebral body height. Normal facet articulation. Normal craniocervical junction.  There is fusion of the C3 and C4 vertebral bodies. There is endplate osteophytosis from C2 through T1 with joint space narrowing.  No evidence epidural or paraspinal hematoma.  IMPRESSION: 1. No acute intracranial findings. 2. Atrophy and microvascular disease. 3. No facial bone fracture. 4. No cervical spine fracture. 5. 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   Electronically Signed   By: Genevive Bi M.D.   On: 07/12/2013 14:42    EKG Interpretation   None       MDM   1. Fall   2. Head injury   3. Multiple contusions    Patient will get x-ray of her right hip complaining of right hip pain to me. Not complaining of left hip pain. CT of head neck and face without any acute injuries. Patient does have a small hematoma to the left parietal part of her scalp and has some contusions and very superficial abrasions abrasions to her left shoulder and left arm. Patient mental status is baseline there is a little bit of some mental confusion. No evidence of significant intracranial injury.  Right hip x-rays negative as well. The patient is suitable for discharge home.  Shelda Jakes, MD 07/12/13 Rickey Primus  Shelda Jakes,  MD 07/12/13 [phone]

## 2013-07-18 NOTE — Telephone Encounter (Signed)
No message to address °

## 2013-08-08 ENCOUNTER — Telehealth: Payer: Self-pay | Admitting: Family Medicine

## 2013-08-09 ENCOUNTER — Other Ambulatory Visit: Payer: Self-pay | Admitting: Family Medicine

## 2013-08-09 DIAGNOSIS — G894 Chronic pain syndrome: Secondary | ICD-10-CM

## 2013-08-09 MED ORDER — HYDROCODONE-ACETAMINOPHEN 7.5-325 MG PO TABS: 1.0000 | ORAL_TABLET | Freq: Two times a day (BID) | ORAL | Status: DC | PRN

## 2013-08-09 NOTE — Telephone Encounter (Signed)
Rx ready for pick up. 

## 2013-08-09 NOTE — Telephone Encounter (Signed)
Patient aware.

## 2013-09-03 ENCOUNTER — Telehealth: Payer: Self-pay | Admitting: Family Medicine

## 2013-09-05 ENCOUNTER — Other Ambulatory Visit: Payer: Self-pay | Admitting: Family Medicine

## 2013-09-05 DIAGNOSIS — G894 Chronic pain syndrome: Secondary | ICD-10-CM

## 2013-09-05 MED ORDER — HYDROCODONE-ACETAMINOPHEN 7.5-325 MG PO TABS: 1.0000 | ORAL_TABLET | Freq: Two times a day (BID) | ORAL | Status: DC | PRN

## 2013-09-05 NOTE — Telephone Encounter (Signed)
Rx ready for pick up. 

## 2013-09-05 NOTE — Telephone Encounter (Signed)
Pt.notified

## 2013-10-07 ENCOUNTER — Telehealth: Payer: Self-pay | Admitting: Family Medicine

## 2013-10-08 ENCOUNTER — Other Ambulatory Visit: Payer: Self-pay | Admitting: Family Medicine

## 2013-10-08 DIAGNOSIS — G894 Chronic pain syndrome: Secondary | ICD-10-CM

## 2013-10-08 MED ORDER — HYDROCODONE-ACETAMINOPHEN 7.5-325 MG PO TABS: 1.0000 | ORAL_TABLET | Freq: Two times a day (BID) | ORAL | Status: DC | PRN

## 2013-10-08 NOTE — Telephone Encounter (Signed)
[name]  Notified that rx ready

## 2013-10-08 NOTE — Telephone Encounter (Signed)
Rx ready for pick up. 

## 2013-10-10 ENCOUNTER — Other Ambulatory Visit: Payer: Self-pay | Admitting: *Deleted

## 2013-10-10 MED ORDER — GABAPENTIN 600 MG PO TABS: 600.0000 mg | ORAL_TABLET | Freq: Three times a day (TID) | ORAL | Status: DC

## 2013-11-04 ENCOUNTER — Other Ambulatory Visit: Payer: Self-pay | Admitting: Family Medicine

## 2013-11-04 DIAGNOSIS — G894 Chronic pain syndrome: Secondary | ICD-10-CM

## 2013-11-05 MED ORDER — HYDROCODONE-ACETAMINOPHEN 7.5-325 MG PO TABS: 1.0000 | ORAL_TABLET | Freq: Two times a day (BID) | ORAL | Status: DC | PRN

## 2013-11-05 NOTE — Telephone Encounter (Signed)
Dr Modesto CharonWong has been prescribing, last filled 10/08/13, last seen 07/12/13. Rx will print

## 2013-11-07 ENCOUNTER — Telehealth: Payer: Self-pay | Admitting: Family Medicine

## 2013-11-08 ENCOUNTER — Other Ambulatory Visit: Payer: Self-pay | Admitting: *Deleted

## 2013-11-08 MED ORDER — SOLIFENACIN SUCCINATE 5 MG PO TABS: 5.0000 mg | ORAL_TABLET | Freq: Every day | ORAL | Status: DC

## 2013-11-08 MED ORDER — GABAPENTIN 600 MG PO TABS: 600.0000 mg | ORAL_TABLET | Freq: Three times a day (TID) | ORAL | Status: DC

## 2013-11-21 ENCOUNTER — Ambulatory Visit (INDEPENDENT_AMBULATORY_CARE_PROVIDER_SITE_OTHER): Payer: Medicare Other | Admitting: Family

## 2013-11-21 ENCOUNTER — Encounter: Payer: Self-pay | Admitting: Family

## 2013-11-21 VITALS — BP 160/80 | HR 63 | Temp 97.1°F | Wt 133.4 lb

## 2013-11-21 DIAGNOSIS — E538 Deficiency of other specified B group vitamins: Secondary | ICD-10-CM

## 2013-11-21 DIAGNOSIS — G894 Chronic pain syndrome: Secondary | ICD-10-CM

## 2013-11-21 DIAGNOSIS — N318 Other neuromuscular dysfunction of bladder: Secondary | ICD-10-CM

## 2013-11-21 DIAGNOSIS — F329 Major depressive disorder, single episode, unspecified: Secondary | ICD-10-CM

## 2013-11-21 DIAGNOSIS — J45909 Unspecified asthma, uncomplicated: Secondary | ICD-10-CM

## 2013-11-21 DIAGNOSIS — F32A Depression, unspecified: Secondary | ICD-10-CM

## 2013-11-21 DIAGNOSIS — G629 Polyneuropathy, unspecified: Secondary | ICD-10-CM | POA: Insufficient documentation

## 2013-11-21 DIAGNOSIS — N3281 Overactive bladder: Secondary | ICD-10-CM

## 2013-11-21 DIAGNOSIS — G609 Hereditary and idiopathic neuropathy, unspecified: Secondary | ICD-10-CM

## 2013-11-21 DIAGNOSIS — F3289 Other specified depressive episodes: Secondary | ICD-10-CM

## 2013-11-21 MED ORDER — GABAPENTIN 800 MG PO TABS: 800.0000 mg | ORAL_TABLET | Freq: Three times a day (TID) | ORAL | Status: DC

## 2013-11-21 MED ORDER — SERTRALINE HCL 50 MG PO TABS: 50.0000 mg | ORAL_TABLET | Freq: Every day | ORAL | Status: DC

## 2013-11-21 MED ORDER — CIPROFLOXACIN HCL 250 MG PO TABS: 250.0000 mg | ORAL_TABLET | ORAL | Status: DC

## 2013-11-21 MED ORDER — SOLIFENACIN SUCCINATE 5 MG PO TABS: 5.0000 mg | ORAL_TABLET | Freq: Every day | ORAL | Status: DC

## 2013-11-21 MED ORDER — HYDROCODONE-ACETAMINOPHEN 7.5-325 MG PO TABS: 1.0000 | ORAL_TABLET | Freq: Two times a day (BID) | ORAL | Status: DC | PRN

## 2013-11-21 NOTE — Progress Notes (Signed)
   Subjective:    Patient ID: [name], female    DOB: 1925/06/20, 80 y.o.   MRN: [id number]  HPI Pt presents to office with several chronic problems that include: Patient Active Problem List   Diagnosis Date Noted  . Peripheral neuropathy 11/21/2013  . Overactive bladder 11/21/2013  . Depression 11/21/2013  . Need for Tdap vaccination 07/04/2013  . OAB (overactive bladder)   . Scoliosis 04/02/2013  . Spinal stenosis, lumbar region, with neurogenic claudication 04/02/2013  . MRSA (methicillin resistant staph aureus) culture positive 02/07/2013  . Crush injury to foot 01/29/2013  . Laceration of toe of left foot 01/29/2013  . Cellulitis 01/28/2013  . Contusion, toe 01/28/2013  . Acute encephalopathy 10/21/2012  . UTI (lower urinary tract infection) 10/21/2012  . Chronic pain 10/21/2012  . ASTHMATIC BRONCHITIS, ACUTE 07/19/2007  . ASTHMA 07/19/2007  . Esophageal reflux 07/19/2007   Pt states her breathing is currently being controlled with her current medications. Pt's only complaint at this time is pain in bilateral legs that radiate to her back. States her pain in her legs is 7 out 10. Moving and walking on her legs make the pain the worse. States the pain is best when she laying in bed.     Review of Systems  Constitutional: Negative.   Respiratory: Negative.   Gastrointestinal: Negative.   Genitourinary: Negative.   Musculoskeletal: Positive for arthralgias, back pain, gait problem and joint swelling.  Neurological: Positive for numbness.  All other systems reviewed and are negative.      Objective:   Physical Exam  Vitals reviewed. Constitutional: She is oriented to person, place, and time. She appears well-developed and well-nourished. No distress.  Eyes: Pupils are equal, round, and reactive to light.  Neck: Normal range of motion. Neck supple. No thyromegaly present.  Cardiovascular: Normal rate, regular rhythm, normal heart sounds and intact distal  pulses.   No murmur heard. Pulmonary/Chest: Effort normal and breath sounds normal. No respiratory distress. She has no wheezes.  Abdominal: Soft. Bowel sounds are normal. She exhibits no distension. There is no tenderness.  Musculoskeletal: Normal range of motion. She exhibits no edema and no tenderness.  Neurological: She is alert and oriented to person, place, and time.  Skin: Skin is warm and dry.  Scattered bruises on extremities   Psychiatric: She has a normal mood and affect. Her behavior is normal. Judgment and thought content normal.   BP 160/80  Pulse 63  Temp(Src) 97.1 F (36.2 C) (Oral)  Wt 133 lb 6.4 oz (60.51 kg)        Assessment & Plan:  1. ASTHMA  2. Peripheral neuropathy - CMP14+EGFR - Vit D  25 hydroxy (rtn osteoporosis monitoring)  3. Chronic pain syndrome - HYDROcodone-acetaminophen (NORCO) 7.5-325 MG per tablet; Take 1 tablet by mouth 2 (two) times daily as needed.  Dispense: 60 tablet; Refill: 0 - CMP14+EGFR - Vit D  25 hydroxy (rtn osteoporosis monitoring)  4. Overactive bladder  5. Depression    Continue all meds-Increased gabapentin to $RemoveBefor'800mg'kgZywVCriaHz$  TID Labs pending Health Maintenance reviewed Diet and exercise encouraged RTO 3 month   [name], [name]

## 2013-11-21 NOTE — Patient Instructions (Signed)

## 2013-11-22 LAB — CMP14+EGFR
A/G RATIO: 2.4 (ref 1.1–2.5)
ALBUMIN: 4.4 g/dL (ref 3.5–4.7)
ALT: 14 IU/L (ref 0–32)
AST: 22 IU/L (ref 0–40)
Alkaline Phosphatase: 106 IU/L (ref 39–117)
BUN/Creatinine Ratio: 18 (ref 11–26)
BUN: 23 mg/dL (ref 8–27)
CALCIUM: 9.1 mg/dL (ref 8.7–10.3)
CO2: 24 mmol/L (ref 18–29)
Chloride: 106 mmol/L (ref 97–108)
Creatinine, Ser: 1.25 mg/dL — ABNORMAL HIGH (ref 0.57–1.00)
GFR calc Af Amer: 45 mL/min/{1.73_m2} — ABNORMAL LOW (ref >59)
GFR, EST NON AFRICAN AMERICAN: 39 mL/min/{1.73_m2} — AB (ref >59)
Globulin, Total: 1.8 g/dL (ref 1.5–4.5)
Glucose: 77 mg/dL (ref 65–99)
Potassium: 4.4 mmol/L (ref 3.5–5.2)
SODIUM: 143 mmol/L (ref 134–144)
Total Bilirubin: 0.4 mg/dL (ref 0.0–1.2)
Total Protein: 6.2 g/dL (ref 6.0–8.5)

## 2013-11-22 LAB — VITAMIN D 25 HYDROXY (VIT D DEFICIENCY, FRACTURES): Vit D, 25-Hydroxy: 49.8 ng/mL (ref 30.0–100.0)

## 2013-11-22 NOTE — Telephone Encounter (Signed)
Message copied by Azalee Course on Fri Nov 22, 2013  8:49 AM ------      Message from: [place], [place]      Created: Fri Nov 22, 2013  8:38 AM       Kidney and liver function stable for pt's age      Vit D levels WNL ------

## 2013-11-22 NOTE — Telephone Encounter (Signed)
PTs daughter is aware of lab results.

## 2014-01-02 ENCOUNTER — Telehealth: Payer: Self-pay | Admitting: Family

## 2014-01-02 DIAGNOSIS — G894 Chronic pain syndrome: Secondary | ICD-10-CM

## 2014-01-02 NOTE — Telephone Encounter (Signed)
Please review

## 2014-01-06 MED ORDER — HYDROCODONE-ACETAMINOPHEN 7.5-325 MG PO TABS: 1.0000 | ORAL_TABLET | Freq: Two times a day (BID) | ORAL | Status: DC | PRN

## 2014-01-06 NOTE — Telephone Encounter (Signed)
Script for hydrocodone picked up here this morning.

## 2014-01-10 ENCOUNTER — Encounter: Payer: Self-pay | Admitting: *Deleted

## 2014-01-30 ENCOUNTER — Telehealth: Payer: Self-pay | Admitting: Family

## 2014-01-30 DIAGNOSIS — G894 Chronic pain syndrome: Secondary | ICD-10-CM

## 2014-01-30 MED ORDER — HYDROCODONE-ACETAMINOPHEN 7.5-325 MG PO TABS: 1.0000 | ORAL_TABLET | Freq: Two times a day (BID) | ORAL | Status: DC | PRN

## 2014-01-30 NOTE — Telephone Encounter (Signed)
Script for Hydrocodone ready.

## 2014-02-27 ENCOUNTER — Ambulatory Visit (INDEPENDENT_AMBULATORY_CARE_PROVIDER_SITE_OTHER): Payer: Medicare Other | Admitting: Family

## 2014-02-27 ENCOUNTER — Encounter: Payer: Self-pay | Admitting: Family

## 2014-02-27 VITALS — BP 157/68 | HR 68 | Temp 98.4°F | Ht 58.5 in | Wt 132.4 lb

## 2014-02-27 DIAGNOSIS — G894 Chronic pain syndrome: Secondary | ICD-10-CM

## 2014-02-27 DIAGNOSIS — Z13820 Encounter for screening for osteoporosis: Secondary | ICD-10-CM

## 2014-02-27 DIAGNOSIS — F32A Depression, unspecified: Secondary | ICD-10-CM

## 2014-02-27 DIAGNOSIS — G629 Polyneuropathy, unspecified: Secondary | ICD-10-CM

## 2014-02-27 DIAGNOSIS — J45909 Unspecified asthma, uncomplicated: Secondary | ICD-10-CM

## 2014-02-27 DIAGNOSIS — F3289 Other specified depressive episodes: Secondary | ICD-10-CM

## 2014-02-27 DIAGNOSIS — N3946 Mixed incontinence: Secondary | ICD-10-CM

## 2014-02-27 DIAGNOSIS — G609 Hereditary and idiopathic neuropathy, unspecified: Secondary | ICD-10-CM

## 2014-02-27 DIAGNOSIS — F329 Major depressive disorder, single episode, unspecified: Secondary | ICD-10-CM

## 2014-02-27 DIAGNOSIS — N3281 Overactive bladder: Secondary | ICD-10-CM

## 2014-02-27 DIAGNOSIS — N318 Other neuromuscular dysfunction of bladder: Secondary | ICD-10-CM

## 2014-02-27 MED ORDER — HYDROCODONE-ACETAMINOPHEN 7.5-325 MG PO TABS: 1.0000 | ORAL_TABLET | Freq: Two times a day (BID) | ORAL | Status: DC | PRN

## 2014-02-27 MED ORDER — MIRABEGRON ER 25 MG PO TB24: 25.0000 mg | ORAL_TABLET | Freq: Every day | ORAL | Status: DC

## 2014-02-27 NOTE — Progress Notes (Signed)
Subjective:    Patient ID: [name], female    DOB: March 05, 1925, 80 y.o.   MRN: [id number]  Pt presents to the office for chronic 3 month follow-up. Pt states she has  Asthma She complains of wheezing ("at times"). There is no difficulty breathing, frequent throat clearing or shortness of breath. This is a chronic problem. The current episode started more than 1 year ago. The problem occurs intermittently. The problem has been waxing and waning. Pertinent negatives include no ear congestion, headaches, nasal congestion or sneezing. Her symptoms are aggravated by any activity. Her symptoms are alleviated by leukotriene antagonist and rest. She reports moderate improvement on treatment. Her past medical history is significant for asthma.  Anxiety Presents for follow-up visit. Symptoms include depressed mood and nervous/anxious behavior. Patient reports no excessive worry, palpitations, panic or shortness of breath. Symptoms occur rarely.   Her past medical history is significant for anxiety/panic attacks, asthma and depression. Past treatments include SSRIs.  Leg Pain  The incident occurred more than 1 week ago. The pain is present in the left leg and right leg. The quality of the pain is described as aching. The pain is at a severity of 7/10. The pain is moderate. Associated symptoms include numbness and tingling. Pertinent negatives include no inability to bear weight. The symptoms are aggravated by movement. She has tried NSAIDs (Gabapentin, Norco) for the symptoms. The treatment provided mild relief.      Review of Systems  Constitutional: Negative.   HENT: Negative.  Negative for sneezing.   Eyes: Negative.   Respiratory: Positive for wheezing ("at times"). Negative for shortness of breath.   Cardiovascular: Negative.  Negative for palpitations.  Gastrointestinal: Negative.   Endocrine: Negative.   Genitourinary: Negative.   Musculoskeletal: Negative.   Neurological: Positive  for tingling and numbness. Negative for headaches.  Hematological: Negative.   Psychiatric/Behavioral: The patient is nervous/anxious.   All other systems reviewed and are negative.      Objective:   Physical Exam  Vitals reviewed. Constitutional: She is oriented to person, place, and time. She appears well-developed and well-nourished. No distress.  HENT:  Head: Normocephalic and atraumatic.  Right Ear: External ear normal.  Mouth/Throat: Oropharynx is clear and moist.  Eyes: Pupils are equal, round, and reactive to light.  Neck: Normal range of motion. Neck supple. No thyromegaly present.  Cardiovascular: Normal rate, regular rhythm, normal heart sounds and intact distal pulses.   No murmur heard. Pulmonary/Chest: Effort normal. No respiratory distress. She has no wheezes.  Diminished breath sounds bilaterally   Abdominal: Soft. Bowel sounds are normal. She exhibits no distension. There is no tenderness.  Musculoskeletal: Normal range of motion. She exhibits no edema and no tenderness.  Neurological: She is alert and oriented to person, place, and time. She has normal reflexes. No cranial nerve deficit.  Skin: Skin is warm and dry. Bruising and ecchymosis noted. Abrasion: Skin tear on right forearm from a fall.  Psychiatric: She has a normal mood and affect. Her behavior is normal. Judgment and thought content normal.    BP 157/68  Pulse 68  Temp(Src) 98.4 F (36.9 C) (Oral)  Ht 4' 10.5" (1.486 m)  Wt 132 lb 6.4 oz (60.056 kg)  BMI 27.20 kg/m2       Assessment & Plan:  1. ASTHMA - CMP14+EGFR  2. Peripheral neuropathy - CMP14+EGFR  3. Overactive bladder - CMP14+EGFR - mirabegron ER (MYRBETRIQ) 25 MG TB24 tablet; Take 1 tablet (25 mg total) by  mouth daily.  Dispense: 30 tablet; Refill: 6  4. Depression - CMP14+EGFR  5. Osteoporosis screening - CMP14+EGFR - Vit D  25 hydroxy (rtn osteoporosis monitoring) - DG Bone Density; Future  6. Mixed incontinence urge  and stress - mirabegron ER (MYRBETRIQ) 25 MG TB24 tablet; Take 1 tablet (25 mg total) by mouth daily.  Dispense: 30 tablet; Refill: 6   Continue all meds Labs pending Health Maintenance reviewed Diet and exercise encouraged RTO 3 months  [name], [name]

## 2014-02-27 NOTE — Patient Instructions (Addendum)
Overactive Bladder The bladder has two functions that are totally opposite of the other. One is to relax and stretch out so it can store urine (fills like a balloon), and the other is to contract and squeeze down so that it can empty the urine that it has stored. Proper functioning of the bladder is a complex mixing of these two functions. The filling and emptying of the bladder can be influenced by:  The bladder.  The spinal cord.  The brain.  The nerves going to the bladder.  Other organs that are closely related to the bladder such as prostate in males and the vagina in females. As your bladder fills with urine, nerve signals are sent from the bladder to the brain to tell you that you may need to urinate. Normal urination requires that the bladder squeeze down with sufficient strength to empty the bladder, but this also requires that the bladder squeeze down sufficiently long to finish the job. In addition the sphincter muscles, which normally keep you from leaking urine, must also relax so that the urine can pass. Coordination between the bladder muscle squeezing down and the sphincter muscles relaxing is required to make everything happen normally. With an overactive bladder sometimes the muscles of the bladder contract unexpectedly and involuntarily and this causes an urgent need to urinate. The normal response is to try to hold urine in by contracting the sphincter muscles. Sometimes the bladder contracts so strongly that the sphincter muscles cannot stop the urine from passing out and incontinence occurs. This kind of incontinence is called urge incontinence. Having an overactive bladder can be embarrassing and awkward. It can keep you from living life the way you want to. Many people think it is just something you have to put up with as you grow older or have certain health conditions. In fact, there are treatments that can help make your life easier and more pleasant. CAUSES  Many things  can cause an overactive bladder. Possibilities include:  Urinary tract infection or infection of nearby tissues such as the prostate.  Prostate enlargement.  In women, multiple pregnancies or surgery on the uterus or urethra.  Bladder stones, inflammation, or tumors.  Caffeine.  Alcohol.  Medications. For example, diuretics (drugs that help the body get rid of extra fluid) increase urine production. Some other medicines must be taken with lots of fluids.  Muscle or nerve weakness. This might be the result of a spinal cord injury, a stroke, multiple sclerosis, or Parkinson disease.  Diabetes can cause a high urine volume which fills the bladder so quickly that the normal urge to urinate is triggered very strongly. SYMPTOMS   Loss of bladder control. You feel the need to urinate and cannot make your body wait.  Sudden, strong urges to urinate.  Urinating 8 or more times a day.  Waking up to urinate two or more times a night. DIAGNOSIS  To decide if you have overactive bladder, your health care provider will probably:  Ask about symptoms you have noticed.  Ask about your overall health. This will include questions about any medications you are taking.  Do a physical examination. This will help determine if there are obvious blockages or other problems.  Order some tests. These might include:  A blood test to check for diabetes or other health issues that could be contributing to the problem.  Urine testing. This could measure the flow of urine and the pressure on the bladder.  A test of your neurological   system (the brain, spinal cord, and nerves). This is the system that senses the need to urinate. Some of these tests are called flow tests, bladder pressure tests, and electrical measurements of the sphincter muscle.  A bladder test to check whether it is emptying completely when you urinate.  Cystoscopy. This test uses a thin tube with a tiny camera on it. It offers a  look inside your urethra and bladder to see if there are problems.  Imaging tests. You might be given a contrast dye and then asked to urinate. X-rays are taken to see how your bladder is working. TREATMENT  An overactive bladder can be treated in many ways. The treatment will depend on the cause. Whether you have a mild or severe case also makes a difference. Often, treatment can be given in your health care provider's office or clinic. Be sure to discuss the different options with your caregiver. They include:  Behavioral treatments. These do not involve medication or surgery:  Bladder training. For this, you would follow a schedule to urinate at regular intervals. This helps you learn to control the urge to urinate. At first, you might be asked to wait a few minutes after feeling the urge. In time, you should be able to schedule bathroom visits an hour or more apart.  Kegel exercises. These exercises strengthen the pelvic floor muscles, which support the bladder. Toning these muscles can help control urination even if the bladder muscles are overactive. A specialist will teach you how to do these exercises correctly. They will require daily practice.  Weight loss. If you are obese or overweight, losing weight might stop your bladder from being overactive. Talk to your health care provider about how many pounds you should lose. Also ask if there is a specific program or method that would work best for you.  Diet change. This might be suggested if constipation is making your overactive bladder worse. Your health care provider or a nutritionist can explain ways to change what you eat to ease constipation. Other people might need to take in less caffeine or alcohol. Sometimes drinking fewer fluids is needed, too.  Protection. This is not an actual treatment. But, you could wear special pads to take care of any leakage while you wait for other treatments to take effect. This will help you avoid  embarrassment.  Physical treatments.  Electrical stimulation. Electrodes will send gentle pulses to the nerves or muscles that help control the bladder. The goal is to strengthen them. Sometimes this is done with the electrodes outside the body. Or, they might be placed inside the body (implanted). This treatment can take several months to have an effect.  Medications. These are usually used along with other treatments. Several medicines are available. Some are injected into the muscles involved in urination. Others come in pill form. Medications sometimes prescribed include:  Anticholinergics. These drugs block the signals that the nerves deliver to the bladder. This keeps it from releasing urine at the wrong time. Researchers think the drugs might help in other ways, too.  Imipramine. This is an antidepressant. But, it relaxes bladder muscles.  Botox. This is still experimental. Some people believe that injecting it into the bladder muscles will relax them so they work more normally. It has also been injected into the sphincter muscle when the sphincter muscle does not open properly. This is a temporary fix, however. Also, it might make matters worse, especially in older people.  Surgery.  A device might be implanted   to help manage your nerves. It works on the nerves that signal when you need to urinate.  Surgery is sometimes needed with electrical stimulation. If the electrodes are implanted, this is done through surgery.  Sometimes repairs need to be made through surgery. For example, the size of the bladder can be changed. This is usually done in severe cases only. HOME CARE INSTRUCTIONS   Take any medications your health care provider prescribed or suggested. Follow the directions carefully.  Practice any lifestyle changes that are recommended. These might include:  Drinking less fluid or drinking at different times of the day. If you need to urinate often during the night, for  example, you may need to stop drinking fluids early in the evening.  Cutting down on caffeine or alcohol. They can both make an overactive bladder worse. Caffeine is found in coffee, tea, and sodas.  Doing Kegel exercises to strengthen muscles.  Losing weight, if that is recommended.  Eating a healthy and balanced diet. This will help you avoid constipation.  Keep a journal or a log. You might be asked to record how much you drink and when, and also when you feel the need to urinate.  Learn how to care for implants or other devices, such as pessaries. SEEK MEDICAL CARE IF:   Your overactive bladder gets worse.  You feel increased pain or irritation when you urinate.  You notice blood in your urine.  You have questions about any medications or devices that your health care provider recommended.  You notice blood, pus, or swelling at the site of any test or treatment procedure.  You have an oral temperature above 102F (38.9C). SEEK IMMEDIATE MEDICAL CARE IF:  You have an oral temperature above 102F (38.9C), not controlled by medicine. Document Released: 04/09/2009 Document Revised: 10/28/2013 Document Reviewed: 04/09/2009 [facility] Patient Information 2015 [place], [place]. This information is not intended to replace advice given to you by your health care provider. Make sure you discuss any questions you have with your health care provider. Urinary Incontinence Urinary incontinence is the involuntary loss of urine from your bladder. CAUSES  There are many causes of urinary incontinence. They include:  Medicines.  Infections.  Prostatic enlargement, leading to overflow of urine from your bladder.  Surgery.  Neurological diseases.  Emotional factors. SIGNS AND SYMPTOMS Urinary Incontinence can be divided into four types: 1. Urge incontinence. Urge incontinence is the involuntary loss of urine before you have the opportunity to go to the bathroom. There is a sudden urge  to void but not enough time to reach a bathroom. 2. Stress incontinence. Stress incontinence is the sudden loss of urine with any activity that forces urine to pass. It is commonly caused by anatomical changes to the pelvis and sphincter areas of your body. 3. Overflow incontinence. Overflow incontinence is the loss of urine from an obstructed opening to your bladder. This results in a backup of urine and a resultant buildup of pressure within the bladder. When the pressure within the bladder exceeds the closing pressure of the sphincter, the urine overflows, which causes incontinence, similar to water overflowing a dam. 4. Total incontinence. Total incontinence is the loss of urine as a result of the inability to store urine within your bladder. DIAGNOSIS  Evaluating the cause of incontinence may require:  A thorough and complete medical and obstetric history.  A complete physical exam.  Laboratory tests such as a urine culture and sensitivities. When additional tests are indicated, they can include:  An ultrasound exam.  Kidney and bladder X-rays.  Cystoscopy. This is an exam of the bladder using a narrow scope.  Urodynamic testing to test the nerve function to the bladder and sphincter areas. TREATMENT  Treatment for urinary incontinence depends on the cause:  For urge incontinence caused by a bacterial infection, antibiotics will be prescribed. If the urge incontinence is related to medicines you take, your health care provider may have you change the medicine.  For stress incontinence, surgery to re-establish anatomical support to the bladder or sphincter, or both, will often correct the condition.  For overflow incontinence caused by an enlarged prostate, an operation to open the channel through the enlarged prostate will allow the flow of urine out of the bladder. In women with fibroids, a hysterectomy may be recommended.  For total incontinence, surgery on your urinary sphincter  may help. An artificial urinary sphincter (an inflatable cuff placed around the urethra) may be required. In women who have developed a hole-like passage between their bladder and vagina (vesicovaginal fistula), surgery to close the fistula often is required. HOME CARE INSTRUCTIONS  Normal daily hygiene and the use of pads or adult diapers that are changed regularly will help prevent odors and skin damage.  Avoid caffeine. It can overstimulate your bladder.  Use the bathroom regularly. Try about every 2-3 hours to go to the bathroom, even if you do not feel the need to do so. Take time to empty your bladder completely. After urinating, wait a minute. Then try to urinate again.  For causes involving nerve dysfunction, keep a log of the medicines you take and a journal of the times you go to the bathroom. SEEK MEDICAL CARE IF:  You experience worsening of pain instead of improvement in pain after your procedure.  Your incontinence becomes worse instead of better. SEE IMMEDIATE MEDICAL CARE IF:  You experience fever or shaking chills.  You are unable to pass your urine.  You have redness spreading into your groin or down into your thighs. MAKE SURE YOU:   Understand these instructions.   Will watch your condition.  Will get help right away if you are not doing well or get worse. Document Released: 07/21/2004 Document Revised: 04/03/2013 Document Reviewed: 11/20/2012 [facility] Patient Information 2015 [place], [place]. This information is not intended to replace advice given to you by your health care provider. Make sure you discuss any questions you have with your health care provider.

## 2014-02-28 LAB — CMP14+EGFR
A/G RATIO: 2.6 — AB (ref 1.1–2.5)
ALBUMIN: 4.4 g/dL (ref 3.5–4.7)
ALT: 14 IU/L (ref 0–32)
AST: 19 IU/L (ref 0–40)
Alkaline Phosphatase: 105 IU/L (ref 39–117)
BILIRUBIN TOTAL: 0.3 mg/dL (ref 0.0–1.2)
BUN/Creatinine Ratio: 16 (ref 11–26)
BUN: 24 mg/dL (ref 8–27)
CHLORIDE: 101 mmol/L (ref 97–108)
CO2: 25 mmol/L (ref 18–29)
CREATININE: 1.49 mg/dL — AB (ref 0.57–1.00)
Calcium: 9.2 mg/dL (ref 8.7–10.3)
GFR, EST AFRICAN AMERICAN: 36 mL/min/{1.73_m2} — AB (ref >59)
GFR, EST NON AFRICAN AMERICAN: 32 mL/min/{1.73_m2} — AB (ref >59)
GLOBULIN, TOTAL: 1.7 g/dL (ref 1.5–4.5)
Glucose: 90 mg/dL (ref 65–99)
Potassium: 4.6 mmol/L (ref 3.5–5.2)
Sodium: 143 mmol/L (ref 134–144)
TOTAL PROTEIN: 6.1 g/dL (ref 6.0–8.5)

## 2014-02-28 LAB — VITAMIN D 25 HYDROXY (VIT D DEFICIENCY, FRACTURES): Vit D, 25-Hydroxy: 50 ng/mL (ref 30.0–100.0)

## 2014-03-05 ENCOUNTER — Telehealth: Payer: Self-pay | Admitting: Family Medicine

## 2014-03-05 NOTE — Telephone Encounter (Signed)
Message copied by Azalee Course on Wed Mar 05, 2014 10:34 AM ------      Message from: [place], [place] A      Created: Tue Mar 04, 2014  8:59 AM       Kidney and liver function stable- Kidney function slightly decreased but expected with age- Will continue to monitor       Vit D levels WNL       ------

## 2014-03-10 ENCOUNTER — Telehealth: Payer: Self-pay | Admitting: Family

## 2014-03-10 NOTE — Telephone Encounter (Signed)
Message copied by Doreatha Massed on Mon Mar 10, 2014 11:38 AM ------      Message from: [place], [place] A      Created: Tue Mar 04, 2014  8:59 AM       Kidney and liver function stable- Kidney function slightly decreased but expected with age- Will continue to monitor       Vit D levels WNL       ------

## 2014-03-11 NOTE — Telephone Encounter (Signed)
Aware. 

## 2014-03-31 ENCOUNTER — Telehealth: Payer: Self-pay | Admitting: Family

## 2014-03-31 DIAGNOSIS — G894 Chronic pain syndrome: Secondary | ICD-10-CM

## 2014-03-31 MED ORDER — HYDROCODONE-ACETAMINOPHEN 7.5-325 MG PO TABS: 1.0000 | ORAL_TABLET | Freq: Two times a day (BID) | ORAL | Status: DC | PRN

## 2014-04-01 NOTE — Telephone Encounter (Signed)
Patient aware.

## 2014-04-28 ENCOUNTER — Telehealth: Payer: Self-pay | Admitting: Family Medicine

## 2014-04-28 ENCOUNTER — Other Ambulatory Visit: Payer: Self-pay | Admitting: Family Medicine

## 2014-04-28 DIAGNOSIS — G894 Chronic pain syndrome: Secondary | ICD-10-CM

## 2014-04-28 NOTE — Telephone Encounter (Signed)
This is already been addressed, see previous note

## 2014-04-28 NOTE — Telephone Encounter (Signed)
Note from family member.

## 2014-04-28 NOTE — Telephone Encounter (Signed)
Patient says she has osteoporosis and her legs pain her so bad that she can't stand for very long.

## 2014-04-28 NOTE — Telephone Encounter (Signed)
Please reduce the strength of this medicine to 5/325. She should continue to take this at the frequency that she was taking the previous pain medicine and she should get no more than a 1 month supply.

## 2014-04-28 NOTE — Telephone Encounter (Signed)
Please confirm why this elderly patient needs such strong medication

## 2014-04-29 MED ORDER — HYDROCODONE-ACETAMINOPHEN 5-325 MG PO TABS: 1.0000 | ORAL_TABLET | Freq: Two times a day (BID) | ORAL | Status: DC | PRN

## 2014-04-29 NOTE — Telephone Encounter (Signed)
THIS HAS BEEN TAKEN CARE OF IN ANOTHER ENCOUNTER

## 2014-04-29 NOTE — Telephone Encounter (Signed)
PATIENT AWARE

## 2014-04-30 IMAGING — CR DG FOOT COMPLETE 3+V*L*
3 series · 3 of 3 positions shown · non-contrast
Comparison: 01/28/2013

CLINICAL DATA: Follow up osteomyelitis

LEFT FOOT - COMPLETE 3+ VIEW

[view not recorded (1 of 3)]
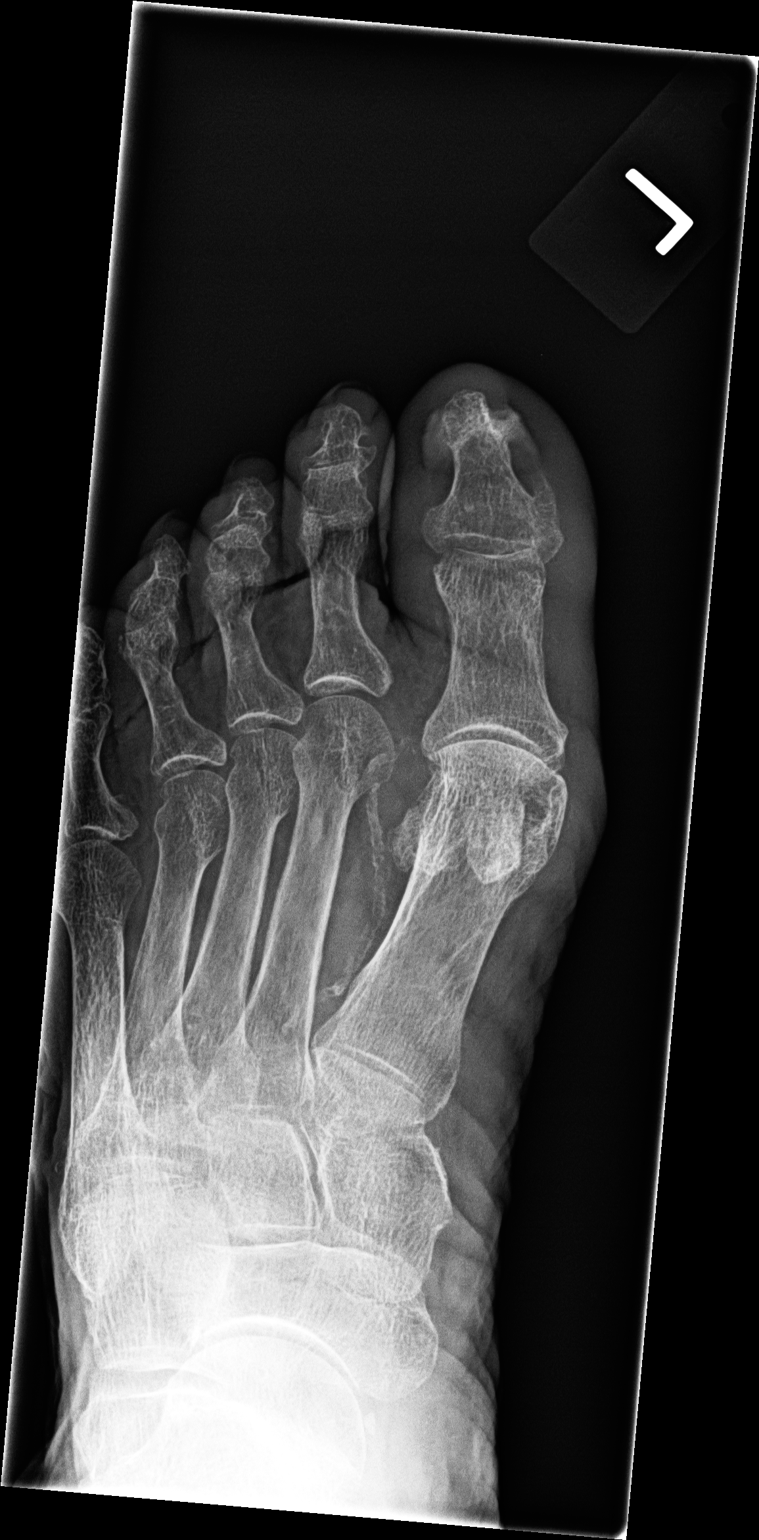

[view not recorded (2 of 3)]
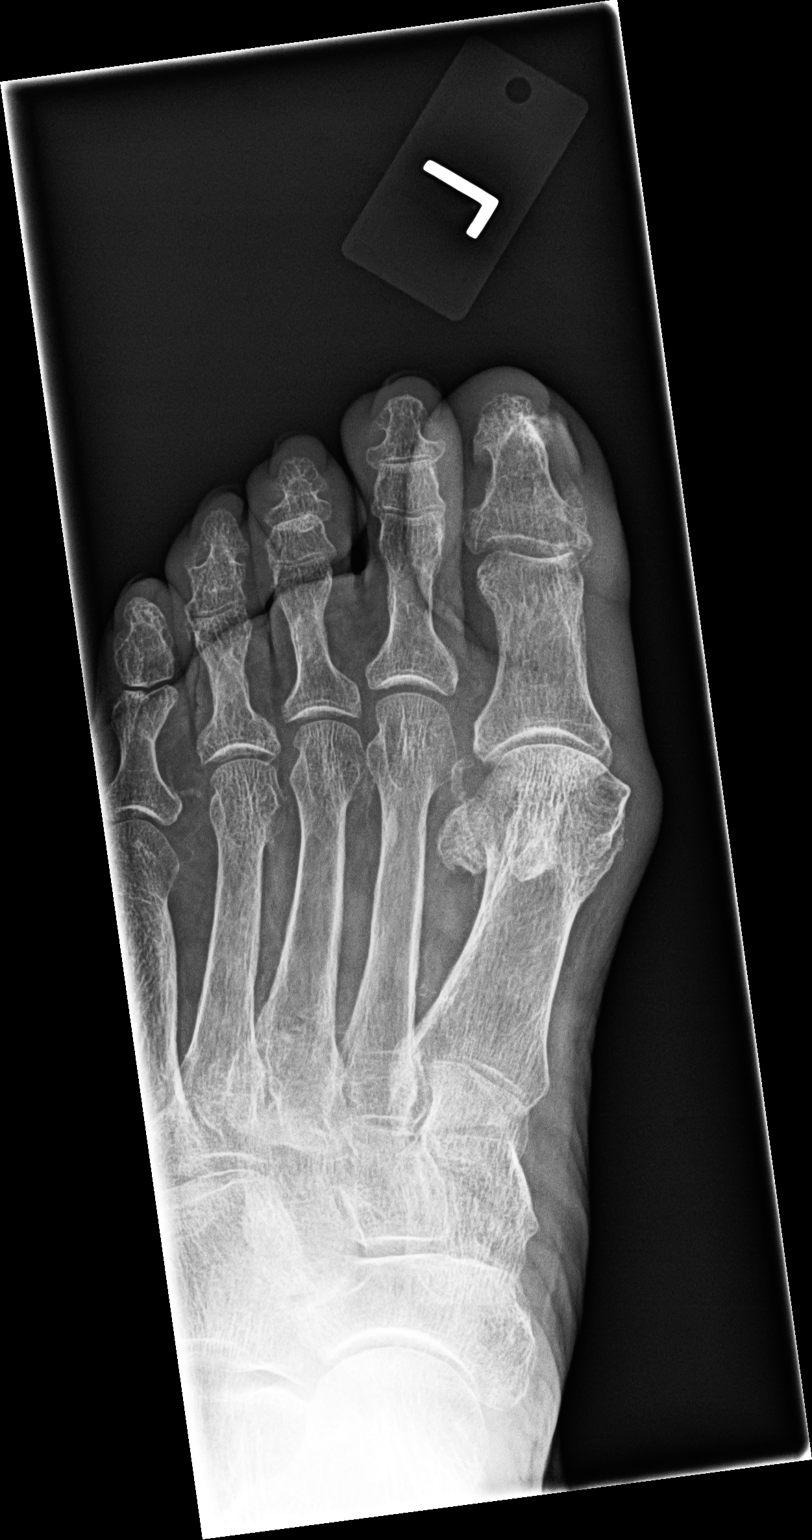

[view not recorded (3 of 3)]
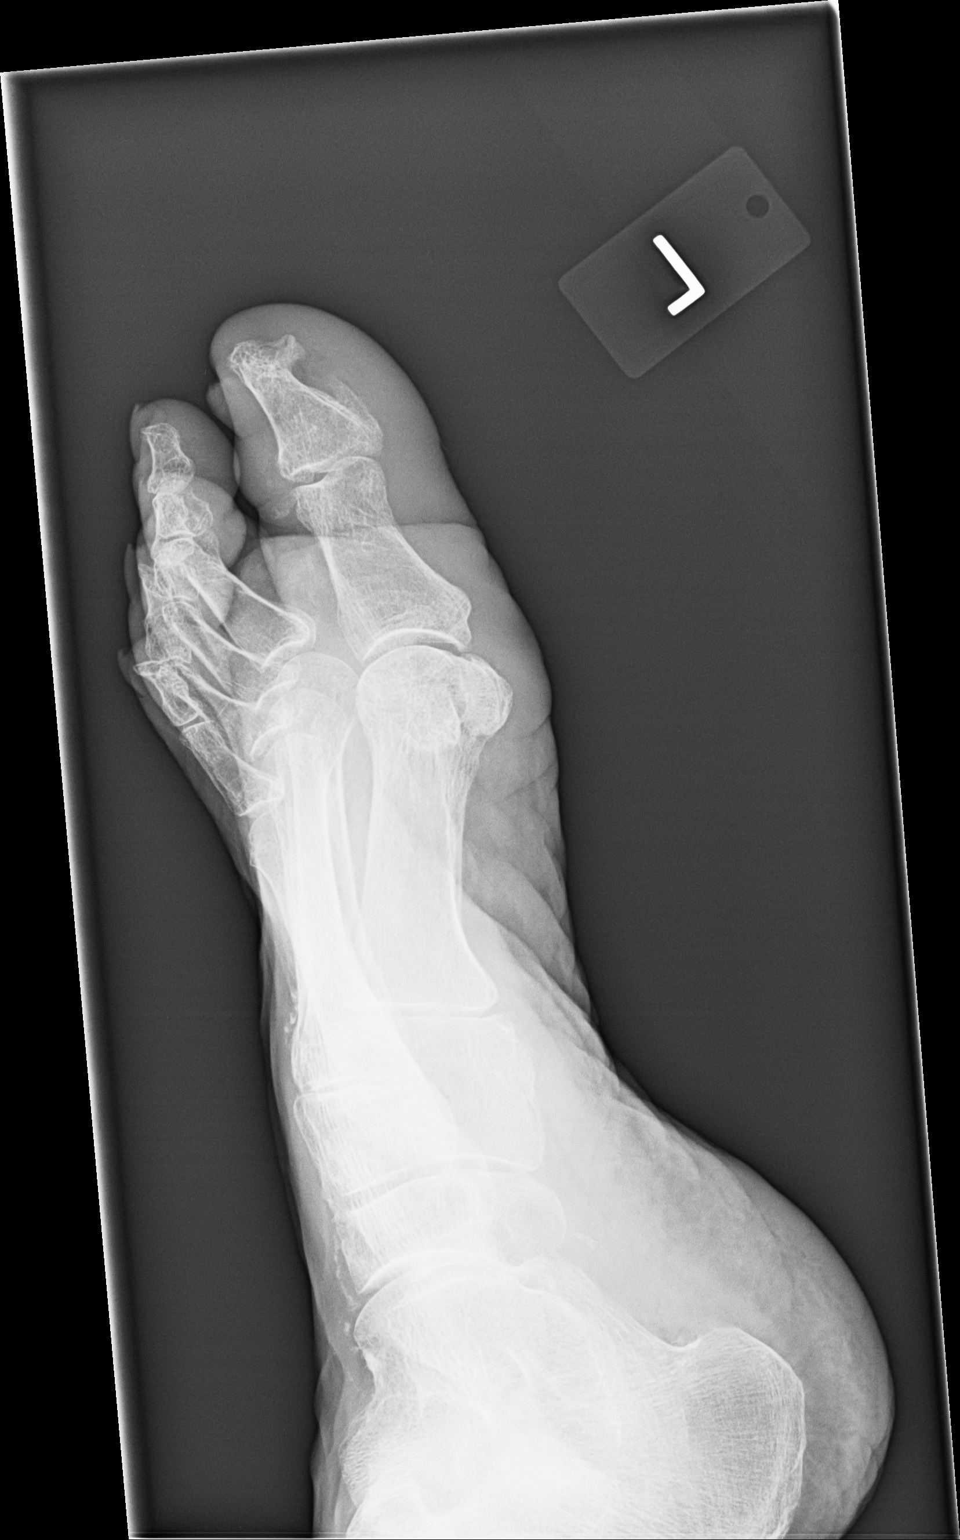

[3 of 3 positions shown; findings below may reference images not displayed]

FINDINGS: Diffuse osteopenia is identified.  No acute fracture is
noted.  Diffuse vascular calcifications are again noted.  No bony
erosion to suggest osteomyelitis is noted.
IMPRESSION: No acute abnormality noted.

Clinically significant discrepancy from primary report, if
provided: None

## 2014-05-21 ENCOUNTER — Other Ambulatory Visit: Payer: Medicare Other

## 2014-05-21 ENCOUNTER — Ambulatory Visit: Payer: Medicare Other

## 2014-05-27 ENCOUNTER — Telehealth: Payer: Self-pay | Admitting: Family

## 2014-05-27 ENCOUNTER — Telehealth: Payer: Self-pay | Admitting: Family Medicine

## 2014-05-27 MED ORDER — HYDROCODONE-ACETAMINOPHEN 5-325 MG PO TABS: 1.0000 | ORAL_TABLET | Freq: Two times a day (BID) | ORAL | Status: DC | PRN

## 2014-05-27 NOTE — Telephone Encounter (Signed)
Pt aware to pick up

## 2014-05-27 NOTE — Telephone Encounter (Signed)
Appointment given for 12/3 with Ander SladeBill Oxford, [name]. Advised that is she starts having any SOB or wheezing then please have her seen before then. Her care taker verbalizes understanding.

## 2014-05-27 NOTE — Telephone Encounter (Signed)
This medication may be refilled 1. Because of the patient's age, continue to reduce the strength and frequency that this medication is being taken.

## 2014-05-29 ENCOUNTER — Encounter: Payer: Self-pay | Admitting: Family Medicine

## 2014-05-29 ENCOUNTER — Ambulatory Visit (INDEPENDENT_AMBULATORY_CARE_PROVIDER_SITE_OTHER): Payer: Medicare Other | Admitting: Family Medicine

## 2014-05-29 VITALS — BP 153/74 | HR 71 | Temp 97.3°F | Ht 58.5 in | Wt 132.0 lb

## 2014-05-29 DIAGNOSIS — J206 Acute bronchitis due to rhinovirus: Secondary | ICD-10-CM

## 2014-05-29 MED ORDER — PREDNISONE 10 MG PO TABS: ORAL_TABLET | ORAL | Status: DC

## 2014-05-29 MED ORDER — LEVOFLOXACIN 250 MG PO TABS: 250.0000 mg | ORAL_TABLET | Freq: Every day | ORAL | Status: DC

## 2014-05-29 NOTE — Progress Notes (Signed)
   Subjective:    Patient ID: [name], female    DOB: 03/26/1925, 80 y.o.   MRN: [id number]  HPI Patient is here c/o URI symptoms and congestion.  Patient has hx of copd.  Review of Systems  Constitutional: Negative for fever.  HENT: Negative for ear pain.   Eyes: Negative for discharge.  Respiratory: Negative for cough.   Cardiovascular: Negative for chest pain.  Gastrointestinal: Negative for abdominal distention.  Endocrine: Negative for polyuria.  Genitourinary: Negative for difficulty urinating.  Musculoskeletal: Negative for gait problem and neck pain.  Skin: Negative for color change and rash.  Neurological: Negative for speech difficulty and headaches.  Psychiatric/Behavioral: Negative for agitation.       Objective:    BP 153/74 mmHg  Pulse 71  Temp(Src) 97.3 F (36.3 C) (Oral)  Ht 4' 10.5" (1.486 m)  Wt 132 lb (59.875 kg)  BMI 27.11 kg/m2 Physical Exam  Constitutional: She is oriented to person, place, and time. She appears well-developed and well-nourished.  HENT:  Head: Normocephalic and atraumatic.  Mouth/Throat: Oropharynx is clear and moist.  Eyes: Pupils are equal, round, and reactive to light.  Neck: Normal range of motion. Neck supple.  Cardiovascular: Normal rate and regular rhythm.   No murmur heard. Pulmonary/Chest: Effort normal. She has wheezes.  Abdominal: Soft. Bowel sounds are normal. There is no tenderness.  Neurological: She is alert and oriented to person, place, and time.  Skin: Skin is warm and dry.  Psychiatric: She has a normal mood and affect.          Assessment & Plan:     ICD-9-CM ICD-10-CM   1. Acute bronchitis due to Rhinovirus 466.0 J20.6    079.3     Levaquin 250 mg one po qd x 10 days. Prednisone 10 mg 4po qd x 2days 2po qd x 2 then 1 po qd x 2d then stop. Continue mucinex  Return if symptoms worsen or fail to improve.  Deatra CanterWilliam J Khamron Gellert [name]

## 2014-06-13 ENCOUNTER — Ambulatory Visit (INDEPENDENT_AMBULATORY_CARE_PROVIDER_SITE_OTHER): Payer: Medicare Other | Admitting: *Deleted

## 2014-06-13 ENCOUNTER — Ambulatory Visit: Payer: Medicare Other

## 2014-06-13 DIAGNOSIS — Z23 Encounter for immunization: Secondary | ICD-10-CM

## 2014-06-30 ENCOUNTER — Telehealth: Payer: Self-pay | Admitting: Family Medicine

## 2014-06-30 ENCOUNTER — Other Ambulatory Visit: Payer: Self-pay | Admitting: Family

## 2014-06-30 MED ORDER — HYDROCODONE-ACETAMINOPHEN 5-325 MG PO TABS: 1.0000 | ORAL_TABLET | Freq: Two times a day (BID) | ORAL | Status: DC | PRN

## 2014-06-30 NOTE — Telephone Encounter (Signed)
Pt aware.

## 2014-07-07 ENCOUNTER — Ambulatory Visit: Payer: Medicare Other | Admitting: Family

## 2014-07-29 ENCOUNTER — Ambulatory Visit (INDEPENDENT_AMBULATORY_CARE_PROVIDER_SITE_OTHER): Payer: Medicare Other | Admitting: Family

## 2014-07-29 ENCOUNTER — Encounter: Payer: Self-pay | Admitting: Family

## 2014-07-29 VITALS — BP 161/67 | HR 64 | Temp 97.0°F | Ht 58.5 in | Wt 126.4 lb

## 2014-07-29 DIAGNOSIS — E538 Deficiency of other specified B group vitamins: Secondary | ICD-10-CM

## 2014-07-29 DIAGNOSIS — J4530 Mild persistent asthma, uncomplicated: Secondary | ICD-10-CM

## 2014-07-29 DIAGNOSIS — G8929 Other chronic pain: Secondary | ICD-10-CM

## 2014-07-29 DIAGNOSIS — F32A Depression, unspecified: Secondary | ICD-10-CM

## 2014-07-29 DIAGNOSIS — N3281 Overactive bladder: Secondary | ICD-10-CM

## 2014-07-29 DIAGNOSIS — F329 Major depressive disorder, single episode, unspecified: Secondary | ICD-10-CM

## 2014-07-29 DIAGNOSIS — E559 Vitamin D deficiency, unspecified: Secondary | ICD-10-CM

## 2014-07-29 DIAGNOSIS — G629 Polyneuropathy, unspecified: Secondary | ICD-10-CM

## 2014-07-29 MED ORDER — HYDROCODONE-ACETAMINOPHEN 5-325 MG PO TABS: 1.0000 | ORAL_TABLET | Freq: Two times a day (BID) | ORAL | Status: DC | PRN

## 2014-07-29 MED ORDER — MONTELUKAST SODIUM 10 MG PO TABS: 10.0000 mg | ORAL_TABLET | Freq: Every day | ORAL | Status: DC

## 2014-07-29 NOTE — Patient Instructions (Addendum)
Health Maintenance Adopting a healthy lifestyle and getting preventive care can go a long way to promote health and wellness. Talk with your health care provider about what schedule of regular examinations is right for you. This is a good chance for you to check in with your provider about disease prevention and staying healthy. In between checkups, there are plenty of things you can do on your own. Experts have done a lot of research about which lifestyle changes and preventive measures are most likely to keep you healthy. Ask your health care provider for more information. WEIGHT AND DIET  Eat a healthy diet  Be sure to include plenty of vegetables, fruits, low-fat dairy products, and lean protein.  Do not eat a lot of foods high in solid fats, added sugars, or salt.  Get regular exercise. This is one of the most important things you can do for your health.  Most adults should exercise for at least 150 minutes each week. The exercise should increase your heart rate and make you sweat (moderate-intensity exercise).  Most adults should also do strengthening exercises at least twice a week. This is in addition to the moderate-intensity exercise.  Maintain a healthy weight  Body mass index (BMI) is a measurement that can be used to identify possible weight problems. It estimates body fat based on height and weight. Your health care provider can help determine your BMI and help you achieve or maintain a healthy weight.  For females 25 years of age and older:   A BMI below 18.5 is considered underweight.  A BMI of 18.5 to 24.9 is normal.  A BMI of 25 to 29.9 is considered overweight.  A BMI of 30 and above is considered obese.  Watch levels of cholesterol and blood lipids  You should start having your blood tested for lipids and cholesterol at 79 years of age, then have this test every 5 years.  You may need to have your cholesterol levels checked more often if:  Your lipid or  cholesterol levels are high.  You are older than 79 years of age.  You are at high risk for heart disease.  CANCER SCREENING   Lung Cancer  Lung cancer screening is recommended for adults 97-92 years old who are at high risk for lung cancer because of a history of smoking.  A yearly low-dose CT scan of the lungs is recommended for people who:  Currently smoke.  Have quit within the past 15 years.  Have at least a 30-pack-year history of smoking. A pack year is smoking an average of one pack of cigarettes a day for 1 year.  Yearly screening should continue until it has been 15 years since you quit.  Yearly screening should stop if you develop a health problem that would prevent you from having lung cancer treatment.  Breast Cancer  Practice breast self-awareness. This means understanding how your breasts normally appear and feel.  It also means doing regular breast self-exams. Let your health care provider know about any changes, no matter how small.  If you are in your 20s or 30s, you should have a clinical breast exam (CBE) by a health care provider every 1-3 years as part of a regular health exam.  If you are 76 or older, have a CBE every year. Also consider having a breast X-ray (mammogram) every year.  If you have a family history of breast cancer, talk to your health care provider about genetic screening.  If you are  at high risk for breast cancer, talk to your health care provider about having an MRI and a mammogram every year.  Breast cancer gene (BRCA) assessment is recommended for women who have family members with BRCA-related cancers. BRCA-related cancers include:  Breast.  Ovarian.  Tubal.  Peritoneal cancers.  Results of the assessment will determine the need for genetic counseling and BRCA1 and BRCA2 testing. Cervical Cancer Routine pelvic examinations to screen for cervical cancer are no longer recommended for nonpregnant women who are considered low  risk for cancer of the pelvic organs (ovaries, uterus, and vagina) and who do not have symptoms. A pelvic examination may be necessary if you have symptoms including those associated with pelvic infections. Ask your health care provider if a screening pelvic exam is right for you.   The Pap test is the screening test for cervical cancer for women who are considered at risk.  If you had a hysterectomy for a problem that was not cancer or a condition that could lead to cancer, then you no longer need Pap tests.  If you are older than 65 years, and you have had normal Pap tests for the past 10 years, you no longer need to have Pap tests.  If you have had past treatment for cervical cancer or a condition that could lead to cancer, you need Pap tests and screening for cancer for at least 20 years after your treatment.  If you no longer get a Pap test, assess your risk factors if they change (such as having a new sexual partner). This can affect whether you should start being screened again.  Some women have medical problems that increase their chance of getting cervical cancer. If this is the case for you, your health care provider may recommend more frequent screening and Pap tests.  The human papillomavirus (HPV) test is another test that may be used for cervical cancer screening. The HPV test looks for the virus that can cause cell changes in the cervix. The cells collected during the Pap test can be tested for HPV.  The HPV test can be used to screen women 30 years of age and older. Getting tested for HPV can extend the interval between normal Pap tests from three to five years.  An HPV test also should be used to screen women of any age who have unclear Pap test results.  After 79 years of age, women should have HPV testing as often as Pap tests.  Colorectal Cancer  This type of cancer can be detected and often prevented.  Routine colorectal cancer screening usually begins at 79 years of  age and continues through 79 years of age.  Your health care provider may recommend screening at an earlier age if you have risk factors for colon cancer.  Your health care provider may also recommend using home test kits to check for hidden blood in the stool.  A small camera at the end of a tube can be used to examine your colon directly (sigmoidoscopy or colonoscopy). This is done to check for the earliest forms of colorectal cancer.  Routine screening usually begins at age 50.  Direct examination of the colon should be repeated every 5-10 years through 79 years of age. However, you may need to be screened more often if early forms of precancerous polyps or small growths are found. Skin Cancer  Check your skin from head to toe regularly.  Tell your health care provider about any new moles or changes in   moles, especially if there is a change in a mole's shape or color.  Also tell your health care provider if you have a mole that is larger than the size of a pencil eraser.  Always use sunscreen. Apply sunscreen liberally and repeatedly throughout the day.  Protect yourself by wearing long sleeves, pants, a wide-brimmed hat, and sunglasses whenever you are outside. HEART DISEASE, DIABETES, AND HIGH BLOOD PRESSURE   Have your blood pressure checked at least every 1-2 years. High blood pressure causes heart disease and increases the risk of stroke.  If you are between 75 years and 42 years old, ask your health care provider if you should take aspirin to prevent strokes.  Have regular diabetes screenings. This involves taking a blood sample to check your fasting blood sugar level.  If you are at a normal weight and have a low risk for diabetes, have this test once every three years after 79 years of age.  If you are overweight and have a high risk for diabetes, consider being tested at a younger age or more often. PREVENTING INFECTION  Hepatitis B  If you have a higher risk for  hepatitis B, you should be screened for this virus. You are considered at high risk for hepatitis B if:  You were born in a country where hepatitis B is common. Ask your health care provider which countries are considered high risk.  Your parents were born in a high-risk country, and you have not been immunized against hepatitis B (hepatitis B vaccine).  You have HIV or AIDS.  You use needles to inject street drugs.  You live with someone who has hepatitis B.  You have had sex with someone who has hepatitis B.  You get hemodialysis treatment.  You take certain medicines for conditions, including cancer, organ transplantation, and autoimmune conditions. Hepatitis C  Blood testing is recommended for:  Everyone born from [birth year] through 1963.  Anyone with known risk factors for hepatitis C. Sexually transmitted infections (STIs)  You should be screened for sexually transmitted infections (STIs) including gonorrhea and chlamydia if:  You are sexually active and are younger than 79 years of age.  You are older than 79 years of age and your health care provider tells you that you are at risk for this type of infection.  Your sexual activity has changed since you were last screened and you are at an increased risk for chlamydia or gonorrhea. Ask your health care provider if you are at risk.  If you do not have HIV, but are at risk, it may be recommended that you take a prescription medicine daily to prevent HIV infection. This is called pre-exposure prophylaxis (PrEP). You are considered at risk if:  You are sexually active and do not regularly use condoms or know the HIV status of your partner(s).  You take drugs by injection.  You are sexually active with a partner who has HIV. Talk with your health care provider about whether you are at high risk of being infected with HIV. If you choose to begin PrEP, you should first be tested for HIV. You should then be tested every 3 months for  as long as you are taking PrEP.  PREGNANCY   If you are premenopausal and you may become pregnant, ask your health care provider about preconception counseling.  If you may become pregnant, take 400 to 800 micrograms (mcg) of folic acid every day.  If you want to prevent pregnancy, talk to your  health care provider about birth control (contraception). OSTEOPOROSIS AND MENOPAUSE   Osteoporosis is a disease in which the bones lose minerals and strength with aging. This can result in serious bone fractures. Your risk for osteoporosis can be identified using a bone density scan.  If you are 4 years of age or older, or if you are at risk for osteoporosis and fractures, ask your health care provider if you should be screened.  Ask your health care provider whether you should take a calcium or vitamin D supplement to lower your risk for osteoporosis.  Menopause may have certain physical symptoms and risks.  Hormone replacement therapy may reduce some of these symptoms and risks. Talk to your health care provider about whether hormone replacement therapy is right for you.  HOME CARE INSTRUCTIONS   Schedule regular health, dental, and eye exams.  Stay current with your immunizations.   Do not use any tobacco products including cigarettes, chewing tobacco, or electronic cigarettes.  If you are pregnant, do not drink alcohol.  If you are breastfeeding, limit how much and how often you drink alcohol.  Limit alcohol intake to no more than 1 drink per day for nonpregnant women. One drink equals 12 ounces of beer, 5 ounces of wine, or 1 ounces of hard liquor.  Do not use street drugs.  Do not share needles.  Ask your health care provider for help if you need support or information about quitting drugs.  Tell your health care provider if you often feel depressed.  Tell your health care provider if you have ever been abused or do not feel safe at home. Document Released: 12/27/2010  Document Revised: 10/28/2013 Document Reviewed: 05/15/2013 [facility] Patient Information 2015 [place], [place]. This information is not intended to replace advice given to you by your health care provider. Make sure you discuss any questions you have with your health care provider. Fall Prevention and Home Safety Falls cause injuries and can affect all age groups. It is possible to use preventive measures to significantly decrease the likelihood of falls. There are many simple measures which can make your home safer and prevent falls. OUTDOORS  Repair cracks and edges of walkways and driveways.  Remove high doorway thresholds.  Trim shrubbery on the main path into your home.  Have good outside lighting.  Clear walkways of tools, rocks, debris, and clutter.  Check that handrails are not broken and are securely fastened. Both sides of steps should have handrails.  Have leaves, snow, and ice cleared regularly.  Use sand or salt on walkways during winter months.  In the garage, clean up grease or oil spills. BATHROOM  Install night lights.  Install grab bars by the toilet and in the tub and shower.  Use non-skid mats or decals in the tub or shower.  Place a plastic non-slip stool in the shower to sit on, if needed.  Keep floors dry and clean up all water on the floor immediately.  Remove soap buildup in the tub or shower on a regular basis.  Secure bath mats with non-slip, double-sided rug tape.  Remove throw rugs and tripping hazards from the floors. BEDROOMS  Install night lights.  Make sure a bedside light is easy to reach.  Do not use oversized bedding.  Keep a telephone by your bedside.  Have a firm chair with side arms to use for getting dressed.  Remove throw rugs and tripping hazards from the floor. KITCHEN  Keep handles on pots and pans turned  toward the center of the stove. Use back burners when possible.  Clean up spills quickly and allow time for  drying.  Avoid walking on wet floors.  Avoid hot utensils and knives.  Position shelves so they are not too high or low.  Place commonly used objects within easy reach.  If necessary, use a sturdy step stool with a grab bar when reaching.  Keep electrical cables out of the way.  Do not use floor polish or wax that makes floors slippery. If you must use wax, use non-skid floor wax.  Remove throw rugs and tripping hazards from the floor. STAIRWAYS  Never leave objects on stairs.  Place handrails on both sides of stairways and use them. Fix any loose handrails. Make sure handrails on both sides of the stairways are as long as the stairs.  Check carpeting to make sure it is firmly attached along stairs. Make repairs to worn or loose carpet promptly.  Avoid placing throw rugs at the top or bottom of stairways, or properly secure the rug with carpet tape to prevent slippage. Get rid of throw rugs, if possible.  Have an electrician put in a light switch at the top and bottom of the stairs. OTHER FALL PREVENTION TIPS  Wear low-heel or rubber-soled shoes that are supportive and fit well. Wear closed toe shoes.  When using a stepladder, make sure it is fully opened and both spreaders are firmly locked. Do not climb a closed stepladder.  Add color or contrast paint or tape to grab bars and handrails in your home. Place contrasting color strips on first and last steps.  Learn and use mobility aids as needed. Install an electrical emergency response system.  Turn on lights to avoid dark areas. Replace light bulbs that burn out immediately. Get light switches that glow.  Arrange furniture to create clear pathways. Keep furniture in the same place.  Firmly attach carpet with non-skid or double-sided tape.  Eliminate uneven floor surfaces.  Select a carpet pattern that does not visually hide the edge of steps.  Be aware of all pets. OTHER HOME SAFETY TIPS  Set the water temperature  for 120 F (48.8 C).  Keep emergency numbers on or near the telephone.  Keep smoke detectors on every level of the home and near sleeping areas. Document Released: 06/03/2002 Document Revised: 12/13/2011 Document Reviewed: 09/02/2011 [facility] Patient Information 2015 [place], [place]. This information is not intended to replace advice given to you by your health care provider. Make sure you discuss any questions you have with your health care provider.

## 2014-07-29 NOTE — Progress Notes (Signed)
Subjective:    Patient ID: [name], female    DOB: [birth year], 81 y.o.   MRN: [id number]  Asthma She complains of wheezing ("at times"). There is no difficulty breathing, frequent throat clearing or shortness of breath. This is a chronic problem. The current episode started more than 1 year ago. The problem occurs intermittently. The problem has been waxing and waning. Pertinent negatives include no ear congestion, headaches, nasal congestion or sneezing. Her symptoms are aggravated by any activity. Her symptoms are alleviated by leukotriene antagonist and rest. She reports moderate improvement on treatment. Her past medical history is significant for asthma.  Anxiety Presents for follow-up visit. Symptoms include depressed mood. Patient reports no impotence, irritability, muscle tension, nervous/anxious behavior, palpitations, panic or shortness of breath. Symptoms occur rarely.   Her past medical history is significant for asthma and depression. Past treatments include SSRIs. The treatment provided significant relief. Compliance with prior treatments has been good.  Leg Pain  The incident occurred more than 1 week ago. The quality of the pain is described as aching. The pain is at a severity of 8/10. The pain is moderate. The pain has been constant since onset. Associated symptoms include numbness. Treatments tried: gabapentin and norco. The treatment provided moderate relief.  OAB Pt currently taking myrbetriq 25 mg daily. Pt states it is helping her and she does not have any complaints at this time. Pt states she is waking up once during the night to use the restroom.   *Pt states she is falling about once every two weeks. Pt states her legs just give out on her. States she thinks it is because she is so weak and her knees just "buckle and give out on me".     Review of Systems  Constitutional: Negative.  Negative for irritability.  HENT: Negative.  Negative for sneezing.     Eyes: Negative.   Respiratory: Positive for wheezing ("at times"). Negative for shortness of breath.   Cardiovascular: Negative.  Negative for palpitations.  Gastrointestinal: Negative.   Endocrine: Negative.   Genitourinary: Negative.  Negative for impotence.  Musculoskeletal: Negative.   Neurological: Positive for numbness. Negative for headaches.  Hematological: Negative.   Psychiatric/Behavioral: Negative.  The patient is not nervous/anxious.   All other systems reviewed and are negative.      Objective:   Physical Exam  Constitutional: She is oriented to person, place, and time. She appears well-developed and well-nourished. No distress.  HENT:  Head: Normocephalic and atraumatic.  Right Ear: External ear normal.  Left Ear: External ear normal.  Nose: Nose normal.  Mouth/Throat: Oropharynx is clear and moist.  Eyes: Pupils are equal, round, and reactive to light.  Neck: Normal range of motion. Neck supple. No thyromegaly present.  Cardiovascular: Normal rate, regular rhythm, normal heart sounds and intact distal pulses.   No murmur heard. Pulmonary/Chest: Effort normal and breath sounds normal. No respiratory distress. She has no wheezes.  Abdominal: Soft. Bowel sounds are normal. She exhibits no distension. There is no tenderness.  Musculoskeletal: Normal range of motion. She exhibits no edema or tenderness.  Neurological: She is alert and oriented to person, place, and time. She has normal reflexes. No cranial nerve deficit.  Skin: Skin is warm and dry.  Psychiatric: She has a normal mood and affect. Her behavior is normal. Judgment and thought content normal.  Vitals reviewed.   BP 161/67 mmHg  Pulse 64  Temp(Src) 97 F (36.1 C) (Oral)  Ht 4' 10.5" (1.486  m)  Wt 126 lb 6.4 oz (57.335 kg)  BMI 25.96 kg/m2       Assessment & Plan:  1. Overactive bladder - CMP14+EGFR  2. Asthma, mild persistent, uncomplicated - VSY54+CYOY  3. Peripheral neuropathy -  CMP14+EGFR  4. Depression - CMP14+EGFR  5. Vitamin D deficiency - CMP14+EGFR - Vit D  25 hydroxy (rtn osteoporosis monitoring)  6. Vitamin B 12 deficiency - CMP14+EGFR - Vitamin B12  7. Chronic pain - HYDROcodone-acetaminophen (NORCO/VICODIN) 5-325 MG per tablet; Take 1 tablet by mouth 2 (two) times daily as needed for moderate pain.  Dispense: 60 tablet; Refill: 0  *Falls prevention discussed Continue all meds Labs pending Health Maintenance reviewed Diet and exercise encouraged RTO 1 year  [name], [name]

## 2014-07-30 ENCOUNTER — Encounter: Payer: Self-pay | Admitting: Family

## 2014-07-30 LAB — CMP14+EGFR
A/G RATIO: 2.1 (ref 1.1–2.5)
ALK PHOS: 90 IU/L (ref 39–117)
ALT: 12 IU/L (ref 0–32)
AST: 18 IU/L (ref 0–40)
Albumin: 4.5 g/dL (ref 3.5–4.7)
BILIRUBIN TOTAL: 0.3 mg/dL (ref 0.0–1.2)
BUN/Creatinine Ratio: 18 (ref 11–26)
BUN: 18 mg/dL (ref 8–27)
CALCIUM: 9.7 mg/dL (ref 8.7–10.3)
CO2: 25 mmol/L (ref 18–29)
Chloride: 103 mmol/L (ref 97–108)
Creatinine, Ser: 0.98 mg/dL (ref 0.57–1.00)
GFR calc Af Amer: 60 mL/min/{1.73_m2} (ref >59)
GFR, EST NON AFRICAN AMERICAN: 52 mL/min/{1.73_m2} — AB (ref >59)
Globulin, Total: 2.1 g/dL (ref 1.5–4.5)
Glucose: 127 mg/dL — ABNORMAL HIGH (ref 65–99)
Potassium: 4.6 mmol/L (ref 3.5–5.2)
SODIUM: 141 mmol/L (ref 134–144)
Total Protein: 6.6 g/dL (ref 6.0–8.5)

## 2014-07-30 LAB — VITAMIN B12: Vitamin B-12: 365 pg/mL (ref 211–946)

## 2014-07-30 LAB — VITAMIN D 25 HYDROXY (VIT D DEFICIENCY, FRACTURES): Vit D, 25-Hydroxy: 49.6 ng/mL (ref 30.0–100.0)

## 2014-08-20 ENCOUNTER — Telehealth: Payer: Self-pay | Admitting: Family

## 2014-08-20 DIAGNOSIS — G8929 Other chronic pain: Secondary | ICD-10-CM

## 2014-08-20 MED ORDER — HYDROCODONE-ACETAMINOPHEN 5-325 MG PO TABS: 1.0000 | ORAL_TABLET | Freq: Two times a day (BID) | ORAL | Status: DC | PRN

## 2014-08-20 NOTE — Telephone Encounter (Signed)
Prescription ready for pick up.

## 2014-08-20 NOTE — Telephone Encounter (Signed)
Pt requests her hydrocodone refilled and she will pick it up on Monday

## 2014-08-20 NOTE — Telephone Encounter (Signed)
Rx placed up front to be picked up Monday per phone call notes

## 2014-09-18 ENCOUNTER — Other Ambulatory Visit: Payer: Self-pay | Admitting: Family

## 2014-09-18 DIAGNOSIS — G8929 Other chronic pain: Secondary | ICD-10-CM

## 2014-09-18 MED ORDER — HYDROCODONE-ACETAMINOPHEN 5-325 MG PO TABS: 1.0000 | ORAL_TABLET | Freq: Two times a day (BID) | ORAL | Status: DC | PRN

## 2014-09-18 NOTE — Telephone Encounter (Signed)
[name] pt, last filled 08/20/14, last seen 07/29/14. Rx will print

## 2014-09-25 ENCOUNTER — Other Ambulatory Visit: Payer: Self-pay | Admitting: Family

## 2014-09-25 ENCOUNTER — Other Ambulatory Visit: Payer: Self-pay | Admitting: Family Medicine

## 2014-10-22 ENCOUNTER — Other Ambulatory Visit: Payer: Self-pay | Admitting: Family

## 2014-10-22 DIAGNOSIS — G8929 Other chronic pain: Secondary | ICD-10-CM

## 2014-10-23 NOTE — Telephone Encounter (Signed)
Last filled 09/18/14, last seen 07/29/14. Rx will print

## 2014-10-24 MED ORDER — HYDROCODONE-ACETAMINOPHEN 5-325 MG PO TABS: 1.0000 | ORAL_TABLET | Freq: Two times a day (BID) | ORAL | Status: DC | PRN

## 2014-10-24 NOTE — Telephone Encounter (Signed)
Patient aware rx is ready to be picked up 

## 2014-10-24 NOTE — Telephone Encounter (Signed)
RX ready for pick up 

## 2014-11-21 ENCOUNTER — Telehealth: Payer: Self-pay | Admitting: Family

## 2014-11-21 DIAGNOSIS — G8929 Other chronic pain: Secondary | ICD-10-CM

## 2014-11-21 MED ORDER — HYDROCODONE-ACETAMINOPHEN 5-325 MG PO TABS: 1.0000 | ORAL_TABLET | Freq: Two times a day (BID) | ORAL | Status: DC | PRN

## 2014-11-21 NOTE — Telephone Encounter (Signed)
RX ready for pick up 

## 2014-11-21 NOTE — Telephone Encounter (Signed)
Patient notified that rx up front and ready for pick up 

## 2014-12-12 ENCOUNTER — Encounter: Payer: Self-pay | Admitting: Family

## 2014-12-12 ENCOUNTER — Ambulatory Visit (INDEPENDENT_AMBULATORY_CARE_PROVIDER_SITE_OTHER): Payer: Medicare Other | Admitting: Family

## 2014-12-12 VITALS — BP 200/78 | HR 69 | Temp 97.3°F | Ht 58.5 in | Wt 130.0 lb

## 2014-12-12 DIAGNOSIS — L089 Local infection of the skin and subcutaneous tissue, unspecified: Secondary | ICD-10-CM

## 2014-12-12 DIAGNOSIS — T148XXA Other injury of unspecified body region, initial encounter: Principal | ICD-10-CM

## 2014-12-12 DIAGNOSIS — I10 Essential (primary) hypertension: Secondary | ICD-10-CM | POA: Diagnosis not present

## 2014-12-12 DIAGNOSIS — T148 Other injury of unspecified body region: Secondary | ICD-10-CM | POA: Diagnosis not present

## 2014-12-12 MED ORDER — LISINOPRIL-HYDROCHLOROTHIAZIDE 20-12.5 MG PO TABS: 1.0000 | ORAL_TABLET | Freq: Every day | ORAL | Status: DC

## 2014-12-12 MED ORDER — SULFAMETHOXAZOLE-TRIMETHOPRIM 800-160 MG PO TABS: 1.0000 | ORAL_TABLET | Freq: Two times a day (BID) | ORAL | Status: DC

## 2014-12-12 NOTE — Progress Notes (Signed)
Subjective:    Patient ID: [name], female    DOB: 02-18-1925, 81 y.o.   MRN: [id number]  Arm Injury  The incident occurred more than 1 week ago. Incident location: Pt fell about 3 weeks ago and hit an "end table " in her living room. The injury mechanism was a direct blow. The pain is present in the left forearm. The quality of the pain is described as aching. The pain does not radiate. The pain is at a severity of 4/10. The pain is mild. The pain has been worsening since the incident. Pertinent negatives include no tingling. Nothing aggravates the symptoms. She has tried nothing for the symptoms. The treatment provided no relief.  Hypertension This is a chronic problem. The current episode started today. The problem is unchanged. The problem is uncontrolled. Associated symptoms include headaches. Pertinent negatives include no anxiety, palpitations, peripheral edema, shortness of breath or sweats. Risk factors for coronary artery disease include post-menopausal state and sedentary lifestyle. Past treatments include nothing. The current treatment provides no improvement. There is no history of kidney disease, CAD/MI, CVA or heart failure. There is no history of sleep apnea.      Review of Systems  Constitutional: Negative.   Eyes: Negative.   Respiratory: Negative.  Negative for shortness of breath.   Cardiovascular: Negative.  Negative for palpitations.  Gastrointestinal: Negative.   Endocrine: Negative.   Genitourinary: Negative.   Musculoskeletal: Negative.   Neurological: Positive for headaches. Negative for tingling.  Hematological: Negative.   Psychiatric/Behavioral: Negative.   All other systems reviewed and are negative.      Objective:   Physical Exam  Constitutional: She is oriented to person, place, and time. She appears well-developed and well-nourished. No distress.  HENT:  Head: Normocephalic and atraumatic.  Eyes: Pupils are equal, round, and reactive to  light.  Neck: Normal range of motion. Neck supple. No thyromegaly present.  Cardiovascular: Normal rate, regular rhythm, normal heart sounds and intact distal pulses.   No murmur heard. Pulmonary/Chest: Effort normal and breath sounds normal. No respiratory distress. She has no wheezes.  Abdominal: Soft. Bowel sounds are normal. She exhibits no distension. There is no tenderness.  Musculoskeletal: Normal range of motion. She exhibits no edema or tenderness.  Neurological: She is alert and oriented to person, place, and time. She has normal reflexes. No cranial nerve deficit.  Skin: Skin is warm and dry. Laceration noted.  Healed laceration on right forearm- I&D and yellow pus drainage from wound, erythemas border  Psychiatric: She has a normal mood and affect. Her behavior is normal. Judgment and thought content normal.  Vitals reviewed.   BP 200/78 mmHg  Pulse 69  Temp(Src) 97.3 F (36.3 C) (Oral)  Ht 4' 10.5" (1.486 m)  Wt 130 lb (58.968 kg)  BMI 26.70 kg/m2       Assessment & Plan:  1. Infected laceration -Keep area clean and dry -Do not pick at wound -S/S of infections discussed- If redness grows around wound or becomes worse RTO - sulfamethoxazole-trimethoprim (BACTRIM DS,SEPTRA DS) 800-160 MG per tablet; Take 1 tablet by mouth 2 (two) times daily.  Dispense: 10 tablet; Refill: 0  2. Essential hypertension Pt started on Zestoretic 20-12.5 mg today- Pt to check BP at home BID -Dash diet information given -Exercise encouraged - Stress Management  -Continue current meds -Falls precaution discussed- Pt to count to 5 every times she stands before she starts to walk -RTO on Monday to follow up on BP -  lisinopril-hydrochlorothiazide (ZESTORETIC) 20-12.5 MG per tablet; Take 1 tablet by mouth daily.  Dispense: 90 tablet; Refill: 3  Jannifer Rodney, [name]

## 2014-12-12 NOTE — Patient Instructions (Signed)
DASH Eating Plan DASH stands for "Dietary Approaches to Stop Hypertension." The DASH eating plan is a healthy eating plan that has been shown to reduce high blood pressure (hypertension). Additional health benefits may include reducing the risk of type 2 diabetes mellitus, heart disease, and stroke. The DASH eating plan may also help with weight loss. WHAT DO I NEED TO KNOW ABOUT THE DASH EATING PLAN? For the DASH eating plan, you will follow these general guidelines:  Choose foods with a percent daily value for sodium of less than 5% (as listed on the food label).  Use salt-free seasonings or herbs instead of table salt or sea salt.  Check with your health care provider or pharmacist before using salt substitutes.  Eat lower-sodium products, often labeled as "lower sodium" or "no salt added."  Eat fresh foods.  Eat more vegetables, fruits, and low-fat dairy products.  Choose whole grains. Look for the word "whole" as the first word in the ingredient list.  Choose fish and skinless chicken or turkey more often than red meat. Limit fish, poultry, and meat to 6 oz (170 g) each day.  Limit sweets, desserts, sugars, and sugary drinks.  Choose heart-healthy fats.  Limit cheese to 1 oz (28 g) per day.  Eat more home-cooked food and less restaurant, buffet, and fast food.  Limit fried foods.  Cook foods using methods other than frying.  Limit canned vegetables. If you do use them, rinse them well to decrease the sodium.  When eating at a restaurant, ask that your food be prepared with less salt, or no salt if possible. WHAT FOODS CAN I EAT? Seek help from a dietitian for individual calorie needs. Grains Whole grain or whole wheat bread. Brown rice. Whole grain or whole wheat pasta. Quinoa, bulgur, and whole grain cereals. Low-sodium cereals. Corn or whole wheat flour tortillas. Whole grain cornbread. Whole grain crackers. Low-sodium crackers. Vegetables Fresh or frozen vegetables  (raw, steamed, roasted, or grilled). Low-sodium or reduced-sodium tomato and vegetable juices. Low-sodium or reduced-sodium tomato sauce and paste. Low-sodium or reduced-sodium canned vegetables.  Fruits All fresh, canned (in natural juice), or frozen fruits. Meat and Other Protein Products Ground beef (85% or leaner), grass-fed beef, or beef trimmed of fat. Skinless chicken or turkey. Ground chicken or turkey. Pork trimmed of fat. All fish and seafood. Eggs. Dried beans, peas, or lentils. Unsalted nuts and seeds. Unsalted canned beans. Dairy Low-fat dairy products, such as skim or 1% milk, 2% or reduced-fat cheeses, low-fat ricotta or cottage cheese, or plain low-fat yogurt. Low-sodium or reduced-sodium cheeses. Fats and Oils Tub margarines without trans fats. Light or reduced-fat mayonnaise and salad dressings (reduced sodium). Avocado. Safflower, olive, or canola oils. Natural peanut or almond butter. Other Unsalted popcorn and pretzels. The items listed above may not be a complete list of recommended foods or beverages. Contact your dietitian for more options. WHAT FOODS ARE NOT RECOMMENDED? Grains White bread. White pasta. White rice. Refined cornbread. Bagels and croissants. Crackers that contain trans fat. Vegetables Creamed or fried vegetables. Vegetables in a cheese sauce. Regular canned vegetables. Regular canned tomato sauce and paste. Regular tomato and vegetable juices. Fruits Dried fruits. Canned fruit in light or heavy syrup. Fruit juice. Meat and Other Protein Products Fatty cuts of meat. Ribs, chicken wings, bacon, sausage, bologna, salami, chitterlings, fatback, hot dogs, bratwurst, and packaged luncheon meats. Salted nuts and seeds. Canned beans with salt. Dairy Whole or 2% milk, cream, half-and-half, and cream cheese. Whole-fat or sweetened yogurt. Full-fat   cheeses or blue cheese. Nondairy creamers and whipped toppings. Processed cheese, cheese spreads, or cheese  curds. Condiments Onion and garlic salt, seasoned salt, table salt, and sea salt. Canned and packaged gravies. Worcestershire sauce. Tartar sauce. Barbecue sauce. Teriyaki sauce. Soy sauce, including reduced sodium. Steak sauce. Fish sauce. Oyster sauce. Cocktail sauce. Horseradish. Ketchup and mustard. Meat flavorings and tenderizers. Bouillon cubes. Hot sauce. Tabasco sauce. Marinades. Taco seasonings. Relishes. Fats and Oils Butter, stick margarine, lard, shortening, ghee, and bacon fat. Coconut, palm kernel, or palm oils. Regular salad dressings. Other Pickles and olives. Salted popcorn and pretzels. The items listed above may not be a complete list of foods and beverages to avoid. Contact your dietitian for more information. WHERE CAN I FIND MORE INFORMATION? National Heart, Lung, and Blood Institute: travelstabloid.com Document Released: 06/02/2011 Document Revised: 10/28/2013 Document Reviewed: 04/17/2013 [facility] Patient Information 2015 [place], [place]. This information is not intended to replace advice given to you by your health care provider. Make sure you discuss any questions you have with your health care provider. Hypertension Hypertension, commonly called high blood pressure, is when the force of blood pumping through your arteries is too strong. Your arteries are the blood vessels that carry blood from your heart throughout your body. A blood pressure reading consists of a higher number over a lower number, such as 110/72. The higher number (systolic) is the pressure inside your arteries when your heart pumps. The lower number (diastolic) is the pressure inside your arteries when your heart relaxes. Ideally you want your blood pressure below 120/80. Hypertension forces your heart to work harder to pump blood. Your arteries may become narrow or stiff. Having hypertension puts you at risk for heart disease, stroke, and other problems.  RISK  FACTORS Some risk factors for high blood pressure are controllable. Others are not.  Risk factors you cannot control include:   Race. You may be at higher risk if you are African American.  Age. Risk increases with age.  Gender. Men are at higher risk than women before age 37 years. After age 55, women are at higher risk than men. Risk factors you can control include:  Not getting enough exercise or physical activity.  Being overweight.  Getting too much fat, sugar, calories, or salt in your diet.  Drinking too much alcohol. SIGNS AND SYMPTOMS Hypertension does not usually cause signs or symptoms. Extremely high blood pressure (hypertensive crisis) may cause headache, anxiety, shortness of breath, and nosebleed. DIAGNOSIS  To check if you have hypertension, your health care provider will measure your blood pressure while you are seated, with your arm held at the level of your heart. It should be measured at least twice using the same arm. Certain conditions can cause a difference in blood pressure between your right and left arms. A blood pressure reading that is higher than normal on one occasion does not mean that you need treatment. If one blood pressure reading is high, ask your health care provider about having it checked again. TREATMENT  Treating high blood pressure includes making lifestyle changes and possibly taking medicine. Living a healthy lifestyle can help lower high blood pressure. You may need to change some of your habits. Lifestyle changes may include:  Following the DASH diet. This diet is high in fruits, vegetables, and whole grains. It is low in salt, red meat, and added sugars.  Getting at least 2 hours of brisk physical activity every week.  Losing weight if necessary.  Not smoking.  Limiting  alcoholic beverages.  Learning ways to reduce stress. If lifestyle changes are not enough to get your blood pressure under control, your health care provider may  prescribe medicine. You may need to take more than one. Work closely with your health care provider to understand the risks and benefits. HOME CARE INSTRUCTIONS  Have your blood pressure rechecked as directed by your health care provider.   Take medicines only as directed by your health care provider. Follow the directions carefully. Blood pressure medicines must be taken as prescribed. The medicine does not work as well when you skip doses. Skipping doses also puts you at risk for problems.   Do not smoke.   Monitor your blood pressure at home as directed by your health care provider. SEEK MEDICAL CARE IF:   You think you are having a reaction to medicines taken.  You have recurrent headaches or feel dizzy.  You have swelling in your ankles.  You have trouble with your vision. SEEK IMMEDIATE MEDICAL CARE IF:  You develop a severe headache or confusion.  You have unusual weakness, numbness, or feel faint.  You have severe chest or abdominal pain.  You vomit repeatedly.  You have trouble breathing. MAKE SURE YOU:   Understand these instructions.  Will watch your condition.  Will get help right away if you are not doing well or get worse. Document Released: 06/13/2005 Document Revised: 10/28/2013 Document Reviewed: 04/05/2013 [facility] Patient Information 2015 [place], [place]. This information is not intended to replace advice given to you by your health care provider. Make sure you discuss any questions you have with your health care provider. Laceration Care, Adult A laceration is a cut or lesion that goes through all layers of the skin and into the tissue just beneath the skin. TREATMENT  Some lacerations may not require closure. Some lacerations may not be able to be closed due to an increased risk of infection. It is important to see your caregiver as soon as possible after an injury to minimize the risk of infection and maximize the opportunity for successful  closure. If closure is appropriate, pain medicines may be given, if needed. The wound will be cleaned to help prevent infection. Your caregiver will use stitches (sutures), staples, wound glue (adhesive), or skin adhesive strips to repair the laceration. These tools bring the skin edges together to allow for faster healing and a better cosmetic outcome. However, all wounds will heal with a scar. Once the wound has healed, scarring can be minimized by covering the wound with sunscreen during the day for 1 full year. HOME CARE INSTRUCTIONS  For sutures or staples:  Keep the wound clean and dry.  If you were given a bandage (dressing), you should change it at least once a day. Also, change the dressing if it becomes wet or dirty, or as directed by your caregiver.  Wash the wound with soap and water 2 times a day. Rinse the wound off with water to remove all soap. Pat the wound dry with a clean towel.  After cleaning, apply a thin layer of the antibiotic ointment as recommended by your caregiver. This will help prevent infection and keep the dressing from sticking.  You may shower as usual after the first 24 hours. Do not soak the wound in water until the sutures are removed.  Only take over-the-counter or prescription medicines for pain, discomfort, or fever as directed by your caregiver.  Get your sutures or staples removed as directed by your caregiver. For  skin adhesive strips:  Keep the wound clean and dry.  Do not get the skin adhesive strips wet. You may bathe carefully, using caution to keep the wound dry.  If the wound gets wet, pat it dry with a clean towel.  Skin adhesive strips will fall off on their own. You may trim the strips as the wound heals. Do not remove skin adhesive strips that are still stuck to the wound. They will fall off in time. For wound adhesive:  You may briefly wet your wound in the shower or bath. Do not soak or scrub the wound. Do not swim. Avoid periods of  heavy perspiration until the skin adhesive has fallen off on its own. After showering or bathing, gently pat the wound dry with a clean towel.  Do not apply liquid medicine, cream medicine, or ointment medicine to your wound while the skin adhesive is in place. This may loosen the film before your wound is healed.  If a dressing is placed over the wound, be careful not to apply tape directly over the skin adhesive. This may cause the adhesive to be pulled off before the wound is healed.  Avoid prolonged exposure to sunlight or tanning lamps while the skin adhesive is in place. Exposure to ultraviolet light in the first year will darken the scar.  The skin adhesive will usually remain in place for 5 to 10 days, then naturally fall off the skin. Do not pick at the adhesive film. You may need a tetanus shot if:  You cannot remember when you had your last tetanus shot.  You have never had a tetanus shot. If you get a tetanus shot, your arm may swell, get red, and feel warm to the touch. This is common and not a problem. If you need a tetanus shot and you choose not to have one, there is a rare chance of getting tetanus. Sickness from tetanus can be serious. SEEK MEDICAL CARE IF:   You have redness, swelling, or increasing pain in the wound.  You see a red line that goes away from the wound.  You have yellowish-white fluid (pus) coming from the wound.  You have a fever.  You notice a bad smell coming from the wound or dressing.  Your wound breaks open before or after sutures have been removed.  You notice something coming out of the wound such as wood or glass.  Your wound is on your hand or foot and you cannot move a finger or toe. SEEK IMMEDIATE MEDICAL CARE IF:   Your pain is not controlled with prescribed medicine.  You have severe swelling around the wound causing pain and numbness or a change in color in your arm, hand, leg, or foot.  Your wound splits open and starts  bleeding.  You have worsening numbness, weakness, or loss of function of any joint around or beyond the wound.  You develop painful lumps near the wound or on the skin anywhere on your body. MAKE SURE YOU:   Understand these instructions.  Will watch your condition.  Will get help right away if you are not doing well or get worse. Document Released: 06/13/2005 Document Revised: 09/05/2011 Document Reviewed: 12/07/2010 [facility] Patient Information 2015 [place], [place]. This information is not intended to replace advice given to you by your health care provider. Make sure you discuss any questions you have with your health care provider.

## 2014-12-16 ENCOUNTER — Ambulatory Visit: Payer: Medicare Other | Admitting: Family

## 2014-12-22 ENCOUNTER — Ambulatory Visit (INDEPENDENT_AMBULATORY_CARE_PROVIDER_SITE_OTHER): Payer: Medicare Other | Admitting: Family

## 2014-12-22 ENCOUNTER — Encounter: Payer: Self-pay | Admitting: Family

## 2014-12-22 VITALS — BP 118/68 | HR 69 | Temp 98.4°F

## 2014-12-22 DIAGNOSIS — T148 Other injury of unspecified body region: Secondary | ICD-10-CM | POA: Diagnosis not present

## 2014-12-22 DIAGNOSIS — IMO0002 Reserved for concepts with insufficient information to code with codable children: Secondary | ICD-10-CM

## 2014-12-22 DIAGNOSIS — I10 Essential (primary) hypertension: Secondary | ICD-10-CM | POA: Diagnosis not present

## 2014-12-22 NOTE — Progress Notes (Signed)
   Subjective:    Patient ID: [name], female    DOB: 03/26/1925, 81 y.o.   MRN: [id number]  HPI Pt presents to the office today to follow up on HTN. Pt has never had HTN in the past, but last office visit pt's BP was 200/78. Pt had an infection and was in pain. Today, pt's BP is WNL. Pt states her arm laceration is improved and is doing "well".    Review of Systems  Constitutional: Negative.   HENT: Negative.   Eyes: Negative.   Respiratory: Negative.  Negative for shortness of breath.   Cardiovascular: Negative.  Negative for palpitations.  Gastrointestinal: Negative.   Endocrine: Negative.   Genitourinary: Negative.   Musculoskeletal: Negative.   Neurological: Negative.  Negative for headaches.  Hematological: Negative.   Psychiatric/Behavioral: Negative.   All other systems reviewed and are negative.      Objective:   Physical Exam  Constitutional: She is oriented to person, place, and time. She appears well-developed and well-nourished. No distress.  HENT:  Head: Normocephalic and atraumatic.  Right Ear: External ear normal.  Left Ear: External ear normal.  Nose: Nose normal.  Mouth/Throat: Oropharynx is clear and moist.  Eyes: Pupils are equal, round, and reactive to light.  Neck: Normal range of motion. Neck supple. No thyromegaly present.  Cardiovascular: Normal rate, regular rhythm, normal heart sounds and intact distal pulses.   No murmur heard. Pulmonary/Chest: Effort normal and breath sounds normal. No respiratory distress. She has no wheezes.  Abdominal: Soft. Bowel sounds are normal. She exhibits no distension. There is no tenderness.  Musculoskeletal: Normal range of motion. She exhibits no edema or tenderness.  Neurological: She is alert and oriented to person, place, and time. She has normal reflexes. No cranial nerve deficit.  Skin: Skin is warm and dry. Laceration noted.  Psychiatric: She has a normal mood and affect. Her behavior is normal.  Judgment and thought content normal.  Vitals reviewed.     BP 118/68 mmHg  Pulse 69  Temp(Src) 98.4 F (36.9 C) (Oral)  Wt      Assessment & Plan:  1. Laceration -Keep clean and dry -Continue with antibiotic onitment daily -RTO if redness, drainage, swelling, or warmth of wound  2. Essential hypertension -HTN resolved- Must have been related to pain and infection -Pt to continue to monitor at home -Encourage healthy diet and exercise   Jannifer Rodneyhristy Brodie Scovell, [name]

## 2014-12-22 NOTE — Patient Instructions (Signed)

## 2014-12-25 ENCOUNTER — Telehealth: Payer: Self-pay | Admitting: Family

## 2014-12-25 DIAGNOSIS — G8929 Other chronic pain: Secondary | ICD-10-CM

## 2014-12-25 MED ORDER — HYDROCODONE-ACETAMINOPHEN 5-325 MG PO TABS: 1.0000 | ORAL_TABLET | Freq: Two times a day (BID) | ORAL | Status: DC | PRN

## 2014-12-25 NOTE — Telephone Encounter (Signed)
RX ready for pick up 

## 2014-12-25 NOTE — Telephone Encounter (Signed)
Aware,script for pain medication ready.  [facility] will pick up script.

## 2014-12-25 NOTE — Telephone Encounter (Signed)
lmovm that written Rx will be at front desk around lunchtime ready for pickup

## 2014-12-30 ENCOUNTER — Other Ambulatory Visit: Payer: Self-pay | Admitting: *Deleted

## 2014-12-30 ENCOUNTER — Other Ambulatory Visit: Payer: Self-pay | Admitting: Family

## 2014-12-30 NOTE — Telephone Encounter (Signed)
Last seen 12/22/14  [name]

## 2015-01-28 ENCOUNTER — Other Ambulatory Visit: Payer: Self-pay | Admitting: Family

## 2015-01-28 DIAGNOSIS — G8929 Other chronic pain: Secondary | ICD-10-CM

## 2015-01-28 MED ORDER — HYDROCODONE-ACETAMINOPHEN 5-325 MG PO TABS: 1.0000 | ORAL_TABLET | Freq: Two times a day (BID) | ORAL | Status: DC | PRN

## 2015-01-28 NOTE — Telephone Encounter (Signed)
Rx ready foe pick up

## 2015-01-28 NOTE — Telephone Encounter (Signed)
Aware,script for pain medication ready. 

## 2015-01-28 NOTE — Telephone Encounter (Signed)
Last Hydrocodone script for quantity 60 on 12-25-14.

## 2015-02-02 ENCOUNTER — Other Ambulatory Visit: Payer: Self-pay | Admitting: *Deleted

## 2015-02-02 MED ORDER — GABAPENTIN 800 MG PO TABS
ORAL_TABLET | ORAL | Status: DC
Start: 2015-02-02 — End: 2015-03-03

## 2015-02-02 MED ORDER — SERTRALINE HCL 50 MG PO TABS: 50.0000 mg | ORAL_TABLET | Freq: Every day | ORAL | Status: DC

## 2015-02-27 ENCOUNTER — Telehealth: Payer: Self-pay | Admitting: Family

## 2015-02-27 DIAGNOSIS — G8929 Other chronic pain: Secondary | ICD-10-CM

## 2015-02-27 MED ORDER — HYDROCODONE-ACETAMINOPHEN 5-325 MG PO TABS: 1.0000 | ORAL_TABLET | Freq: Two times a day (BID) | ORAL | Status: DC | PRN

## 2015-02-27 NOTE — Telephone Encounter (Signed)
Rx ready for pick up. 

## 2015-02-27 NOTE — Telephone Encounter (Signed)
Aware,script for pain medication ready. 

## 2015-03-03 ENCOUNTER — Other Ambulatory Visit: Payer: Self-pay | Admitting: Family

## 2015-03-30 ENCOUNTER — Other Ambulatory Visit: Payer: Self-pay | Admitting: Family

## 2015-03-30 DIAGNOSIS — G8929 Other chronic pain: Secondary | ICD-10-CM

## 2015-03-30 MED ORDER — HYDROCODONE-ACETAMINOPHEN 5-325 MG PO TABS: 1.0000 | ORAL_TABLET | Freq: Two times a day (BID) | ORAL | Status: DC | PRN

## 2015-03-30 NOTE — Telephone Encounter (Signed)
lmovm that written Rx is at front desk ready for pickup 

## 2015-03-30 NOTE — Telephone Encounter (Signed)
RX ready for pick up 

## 2015-04-01 ENCOUNTER — Other Ambulatory Visit: Payer: Self-pay | Admitting: Family

## 2015-04-01 NOTE — Telephone Encounter (Signed)
X °

## 2015-04-27 ENCOUNTER — Telehealth: Payer: Self-pay | Admitting: Family

## 2015-04-27 DIAGNOSIS — G8929 Other chronic pain: Secondary | ICD-10-CM

## 2015-04-27 MED ORDER — HYDROCODONE-ACETAMINOPHEN 5-325 MG PO TABS: 1.0000 | ORAL_TABLET | Freq: Two times a day (BID) | ORAL | Status: DC | PRN

## 2015-04-27 NOTE — Telephone Encounter (Signed)
Message left at residence that written Rx is at front desk ready for pickup

## 2015-04-27 NOTE — Telephone Encounter (Signed)
RX ready  For pick up

## 2015-04-29 ENCOUNTER — Other Ambulatory Visit: Payer: Self-pay | Admitting: Family

## 2015-04-30 ENCOUNTER — Ambulatory Visit (INDEPENDENT_AMBULATORY_CARE_PROVIDER_SITE_OTHER): Payer: Medicare Other | Admitting: Family

## 2015-04-30 ENCOUNTER — Encounter (INDEPENDENT_AMBULATORY_CARE_PROVIDER_SITE_OTHER): Payer: Self-pay

## 2015-04-30 ENCOUNTER — Encounter: Payer: Self-pay | Admitting: Family

## 2015-04-30 VITALS — BP 151/77 | HR 63 | Temp 99.1°F | Ht 58.5 in | Wt 124.0 lb

## 2015-04-30 DIAGNOSIS — E538 Deficiency of other specified B group vitamins: Secondary | ICD-10-CM | POA: Diagnosis not present

## 2015-04-30 DIAGNOSIS — M419 Scoliosis, unspecified: Secondary | ICD-10-CM | POA: Diagnosis not present

## 2015-04-30 DIAGNOSIS — Z23 Encounter for immunization: Secondary | ICD-10-CM

## 2015-04-30 DIAGNOSIS — G8929 Other chronic pain: Secondary | ICD-10-CM | POA: Diagnosis not present

## 2015-04-30 DIAGNOSIS — M48062 Spinal stenosis, lumbar region with neurogenic claudication: Secondary | ICD-10-CM

## 2015-04-30 DIAGNOSIS — E559 Vitamin D deficiency, unspecified: Secondary | ICD-10-CM

## 2015-04-30 DIAGNOSIS — M4806 Spinal stenosis, lumbar region: Secondary | ICD-10-CM | POA: Diagnosis not present

## 2015-04-30 DIAGNOSIS — G6289 Other specified polyneuropathies: Secondary | ICD-10-CM

## 2015-04-30 DIAGNOSIS — N3281 Overactive bladder: Secondary | ICD-10-CM

## 2015-04-30 DIAGNOSIS — F32A Depression, unspecified: Secondary | ICD-10-CM

## 2015-04-30 DIAGNOSIS — F329 Major depressive disorder, single episode, unspecified: Secondary | ICD-10-CM

## 2015-04-30 DIAGNOSIS — J4530 Mild persistent asthma, uncomplicated: Secondary | ICD-10-CM | POA: Diagnosis not present

## 2015-04-30 MED ORDER — MIRABEGRON ER 50 MG PO TB24: 50.0000 mg | ORAL_TABLET | Freq: Every day | ORAL | Status: DC

## 2015-04-30 MED ORDER — PROCARE ADULT BRIEFS MEDIUM MISC: Status: AC

## 2015-04-30 MED ORDER — PROCARE ADULT BRIEFS MEDIUM MISC: Status: DC

## 2015-04-30 MED ORDER — MONTELUKAST SODIUM 10 MG PO TABS: 10.0000 mg | ORAL_TABLET | Freq: Every day | ORAL | Status: DC

## 2015-04-30 NOTE — Progress Notes (Signed)
Subjective:    Patient ID: [name], female    DOB: [birth year], 81 y.o.   MRN: [id number]  Pt presents to the office today for chronic follow up. Pt states she is not doing "too good". Pt states she can not walk without a walker or wheelchair because her "legs just give out". Pt states she does not believe that PT would not help at this time.  Hypertension This is a chronic problem. The current episode started more than 1 year ago. The problem has been waxing and waning since onset. The problem is uncontrolled. Associated symptoms include anxiety. Pertinent negatives include no headaches, palpitations, peripheral edema or shortness of breath. Risk factors for coronary artery disease include post-menopausal state and sedentary lifestyle. Past treatments include ACE inhibitors and diuretics. The current treatment provides significant improvement. There is no history of kidney disease, CAD/MI, CVA, heart failure or a thyroid problem. There is no history of sleep apnea.  Asthma She complains of wheezing ("at times"). There is no difficulty breathing, frequent throat clearing or shortness of breath. This is a chronic problem. The current episode started more than 1 year ago. The problem occurs intermittently. The problem has been waxing and waning. Pertinent negatives include no ear congestion, headaches, nasal congestion or sneezing. Her symptoms are aggravated by any activity. Her symptoms are alleviated by leukotriene antagonist and rest. She reports moderate improvement on treatment. Her past medical history is significant for asthma.  Anxiety Presents for follow-up visit. Onset was more than 5 years ago. The problem has been waxing and waning. Symptoms include depressed mood. Patient reports no excessive worry, impotence, irritability, muscle tension, nervous/anxious behavior, palpitations, panic or shortness of breath. Symptoms occur rarely.   Her past medical history is significant for  asthma and depression. Past treatments include SSRIs. The treatment provided significant relief. Compliance with prior treatments has been good.  Leg Pain  The incident occurred more than 1 week ago. The quality of the pain is described as aching. The pain is at a severity of 7/10. The pain is moderate. The pain has been constant since onset. Associated symptoms include numbness. Treatments tried: gabapentin and norco. The treatment provided moderate relief.  [facility] PT states she is taking myrbetiriq 25 mg every day.  Pt states this use to help, but seems to not be working "as well as it use". Pt states she has been taking it for years.     Review of Systems  Constitutional: Negative.  Negative for irritability.  HENT: Negative.  Negative for sneezing.   Eyes: Negative.   Respiratory: Positive for wheezing ("at times"). Negative for shortness of breath.   Cardiovascular: Negative.  Negative for palpitations.  Gastrointestinal: Negative.   Endocrine: Negative.   Genitourinary: Negative.  Negative for impotence.  Musculoskeletal: Negative.   Neurological: Positive for numbness. Negative for headaches.  Hematological: Negative.   Psychiatric/Behavioral: Negative.  The patient is not nervous/anxious.   All other systems reviewed and are negative.      Objective:   Physical Exam  Constitutional: She is oriented to person, place, and time. She appears well-developed and well-nourished. No distress.  HENT:  Head: Normocephalic and atraumatic.  Right Ear: External ear normal.  Left Ear: External ear normal.  Nose: Nose normal.  Mouth/Throat: Oropharynx is clear and moist.  Eyes: Pupils are equal, round, and reactive to light.  Neck: Normal range of motion. Neck supple. No thyromegaly present.  Cardiovascular: Normal rate, normal heart sounds and intact distal pulses.  No murmur heard. Irregular rhythm   Pulmonary/Chest: Effort normal and breath sounds normal. No respiratory distress. She  has no wheezes.  Abdominal: Soft. Bowel sounds are normal. She exhibits no distension. There is no tenderness.  Musculoskeletal: Normal range of motion. She exhibits no edema or tenderness.  Mild lower extremity weakness   Neurological: She is alert and oriented to person, place, and time. She has normal reflexes. No cranial nerve deficit.  Skin: Skin is warm and dry.  Psychiatric: She has a normal mood and affect. Her behavior is normal. Judgment and thought content normal.  Vitals reviewed.   BP 151/77 mmHg  Pulse 63  Temp(Src) 99.1 F (37.3 C) (Oral)  Ht 4' 10.5" (1.486 m)  Wt 124 lb (56.246 kg)  BMI 25.47 kg/m2       Assessment & Plan:  1. Asthma, mild persistent, uncomplicated - SMM40+AREQ - montelukast (SINGULAIR) 10 MG tablet; Take 1 tablet (10 mg total) by mouth at bedtime.  Dispense: 90 tablet; Refill: 3  2. Vitamin B 12 deficiency - CMP14+EGFR - Anemia Profile B  3. Other polyneuropathy ([place]) - CMP14+EGFR  4. Scoliosis - CMP14+EGFR  5. Overactive bladder -Pt's myrbetriq increased 50 mg from 25 mg  - CMP14+EGFR - Incontinence Supply Disposable (PROCARE ADULT BRIEFS MEDIUM) MISC; Use as needed for incontinence  Dispense: 100 each; Refill: 3 - mirabegron ER (MYRBETRIQ) 50 MG TB24 tablet; Take 1 tablet (50 mg total) by mouth daily.  Dispense: 90 tablet; Refill: 1  6. Chronic pain - CMP14+EGFR  7. Depression - CMP14+EGFR  8. Vitamin D deficiency - CMP14+EGFR - Vit D  25 hydroxy (rtn osteoporosis monitoring)  9. Spinal stenosis, lumbar region, with neurogenic claudication - CMP14+EGFR   Continue all meds Labs pending Health Maintenance reviewed Diet and exercise encouraged RTO 6 months  [name], [name]

## 2015-04-30 NOTE — Addendum Note (Signed)
Addended by: Jannifer RodneyHAWKS, [place] Sorn A on: 04/30/2015 05:01 PM   Modules accepted: Orders

## 2015-04-30 NOTE — Patient Instructions (Addendum)
Menopause is a normal process in which your reproductive ability comes to an end. This process happens gradually over a span of months to years, usually between the ages of 48 and 55. Menopause is complete when you have missed 12 consecutive menstrual periods. It is important to talk with your health care provider about some of the most common conditions that affect postmenopausal women, such as heart disease, cancer, and bone loss (osteoporosis). Adopting a healthy lifestyle and getting preventive care can help to promote your health and wellness. Those actions can also lower your chances of developing some of these common conditions. WHAT SHOULD I KNOW ABOUT MENOPAUSE? During menopause, you may experience a number of symptoms, such as:  Moderate-to-severe hot flashes.  Night sweats.  Decrease in sex drive.  Mood swings.  Headaches.  Tiredness.  Irritability.  Memory problems.  Insomnia. Choosing to treat or not to treat menopausal changes is an individual decision that you make with your health care provider. WHAT SHOULD I KNOW ABOUT HORMONE REPLACEMENT THERAPY AND SUPPLEMENTS? Hormone therapy products are effective for treating symptoms that are associated with menopause, such as hot flashes and night sweats. Hormone replacement carries certain risks, especially as you become older. If you are thinking about using estrogen or estrogen with progestin treatments, discuss the benefits and risks with your health care provider. WHAT SHOULD I KNOW ABOUT HEART DISEASE AND STROKE? Heart disease, heart attack, and stroke become more likely as you age. This may be due, in part, to the hormonal changes that your body experiences during menopause. These can affect how your body processes dietary fats, triglycerides, and cholesterol. Heart attack and stroke are both medical emergencies. There are many things that you can do to help prevent heart disease and stroke:  Have your blood pressure  checked at least every 1-2 years. High blood pressure causes heart disease and increases the risk of stroke.  If you are 55-79 years old, ask your health care provider if you should take aspirin to prevent a heart attack or a stroke.  Do not use any tobacco products, including cigarettes, chewing tobacco, or electronic cigarettes. If you need help quitting, ask your health care provider.  It is important to eat a healthy diet and maintain a healthy weight.  Be sure to include plenty of vegetables, fruits, low-fat dairy products, and lean protein.  Avoid eating foods that are high in solid fats, added sugars, or salt (sodium).  Get regular exercise. This is one of the most important things that you can do for your health.  Try to exercise for at least 150 minutes each week. The type of exercise that you do should increase your heart rate and make you sweat. This is known as moderate-intensity exercise.  Try to do strengthening exercises at least twice each week. Do these in addition to the moderate-intensity exercise.  Know your numbers.Ask your health care provider to check your cholesterol and your blood glucose. Continue to have your blood tested as directed by your health care provider. WHAT SHOULD I KNOW ABOUT CANCER SCREENING? There are several types of cancer. Take the following steps to reduce your risk and to catch any cancer development as early as possible. Breast Cancer  Practice breast self-awareness.  This means understanding how your breasts normally appear and feel.  It also means doing regular breast self-exams. Let your health care provider know about any changes, no matter how small.  If you are 40 or older, have a clinician do a   breast exam (clinical breast exam or CBE) every year. Depending on your age, family history, and medical history, it may be recommended that you also have a yearly breast X-ray (mammogram).  If you have a family history of breast cancer,  talk with your health care provider about genetic screening.  If you are at high risk for breast cancer, talk with your health care provider about having an MRI and a mammogram every year.  Breast cancer (BRCA) gene test is recommended for women who have family members with BRCA-related cancers. Results of the assessment will determine the need for genetic counseling and BRCA1 and for BRCA2 testing. BRCA-related cancers include these types:  Breast. This occurs in males or females.  Ovarian.  Tubal. This may also be called fallopian tube cancer.  Cancer of the abdominal or pelvic lining (peritoneal cancer).  Prostate.  Pancreatic. Cervical, Uterine, and Ovarian Cancer Your health care provider may recommend that you be screened regularly for cancer of the pelvic organs. These include your ovaries, uterus, and vagina. This screening involves a pelvic exam, which includes checking for microscopic changes to the surface of your cervix (Pap test).  For women ages 21-65, health care providers may recommend a pelvic exam and a Pap test every three years. For women ages 77-65, they may recommend the Pap test and pelvic exam, combined with testing for human papilloma virus (HPV), every five years. Some types of HPV increase your risk of cervical cancer. Testing for HPV may also be done on women of any age who have unclear Pap test results.  Other health care providers may not recommend any screening for nonpregnant women who are considered low risk for pelvic cancer and have no symptoms. Ask your health care provider if a screening pelvic exam is right for you.  If you have had past treatment for cervical cancer or a condition that could lead to cancer, you need Pap tests and screening for cancer for at least 20 years after your treatment. If Pap tests have been discontinued for you, your risk factors (such as having a new sexual partner) need to be reassessed to determine if you should start having  screenings again. Some women have medical problems that increase the chance of getting cervical cancer. In these cases, your health care provider may recommend that you have screening and Pap tests more often.  If you have a family history of uterine cancer or ovarian cancer, talk with your health care provider about genetic screening.  If you have vaginal bleeding after reaching menopause, tell your health care provider.  There are currently no reliable tests available to screen for ovarian cancer. Lung Cancer Lung cancer screening is recommended for adults 3-70 years old who are at high risk for lung cancer because of a history of smoking. A yearly low-dose CT scan of the lungs is recommended if you:  Currently smoke.  Have a history of at least 30 pack-years of smoking and you currently smoke or have quit within the past 15 years. A pack-year is smoking an average of one pack of cigarettes per day for one year. Yearly screening should:  Continue until it has been 15 years since you quit.  Stop if you develop a health problem that would prevent you from having lung cancer treatment. Colorectal Cancer  This type of cancer can be detected and can often be prevented.  Routine colorectal cancer screening usually begins at age 38 and continues through age 12.  If you have  risk factors for colon cancer, your health care provider may recommend that you be screened at an earlier age.  If you have a family history of colorectal cancer, talk with your health care provider about genetic screening.  Your health care provider may also recommend using home test kits to check for hidden blood in your stool.  A small camera at the end of a tube can be used to examine your colon directly (sigmoidoscopy or colonoscopy). This is done to check for the earliest forms of colorectal cancer.  Direct examination of the colon should be repeated every 5-10 years until age 67. However, if early forms of  precancerous polyps or small growths are found or if you have a family history or genetic risk for colorectal cancer, you may need to be screened more often. Skin Cancer  Check your skin from head to toe regularly.  Monitor any moles. Be sure to tell your health care provider:  About any new moles or changes in moles, especially if there is a change in a mole's shape or color.  If you have a mole that is larger than the size of a pencil eraser.  If any of your family members has a history of skin cancer, especially at a young age, talk with your health care provider about genetic screening.  Always use sunscreen. Apply sunscreen liberally and repeatedly throughout the day.  Whenever you are outside, protect yourself by wearing long sleeves, pants, a wide-brimmed hat, and sunglasses. WHAT SHOULD I KNOW ABOUT OSTEOPOROSIS? Osteoporosis is a condition in which bone destruction happens more quickly than new bone creation. After menopause, you may be at an increased risk for osteoporosis. To help prevent osteoporosis or the bone fractures that can happen because of osteoporosis, the following is recommended:  If you are 39-61 years old, get at least 1,000 mg of calcium and at least 600 mg of vitamin D per day.  If you are older than age 16 but younger than age 7, get at least 1,200 mg of calcium and at least 600 mg of vitamin D per day.  If you are older than age 47, get at least 1,200 mg of calcium and at least 800 mg of vitamin D per day. Smoking and excessive alcohol intake increase the risk of osteoporosis. Eat foods that are rich in calcium and vitamin D, and do weight-bearing exercises several times each week as directed by your health care provider. WHAT SHOULD I KNOW ABOUT HOW MENOPAUSE AFFECTS [name]? Depression may occur at any age, but it is more common as you become older. Common symptoms of depression include:  Low or sad mood.  Changes in sleep patterns.  Changes  in appetite or eating patterns.  Feeling an overall lack of motivation or enjoyment of activities that you previously enjoyed.  Frequent crying spells. Talk with your health care provider if you think that you are experiencing depression. WHAT SHOULD I KNOW ABOUT IMMUNIZATIONS? It is important that you get and maintain your immunizations. These include:  Tetanus, diphtheria, and pertussis (Tdap) booster vaccine.  Influenza every year before the flu season begins.  Pneumonia vaccine.  Shingles vaccine. Your health care provider may also recommend other immunizations.   This information is not intended to replace advice given to you by your health care provider. Make sure you discuss any questions you have with your health care provider.   Document Released: 08/05/2005 Document Revised: 07/04/2014 Document Reviewed: 02/13/2014 Elsevier Interactive Patient Education 2016 Else[employer] Overactive Bladder, Adult Overactive bladder is a group of urinary symptoms. With overactive bladder, you may suddenly feel the need to pass urine (urinate) right away. After feeling this sudden urge, you might also leak urine if you cannot get to the bathroom fast enough (urinary incontinence). These symptoms might interfere with your daily work or social activities. Overactive bladder symptoms may also wake you up at night. Overactive bladder affects the nerve signals between your bladder and your brain. Your bladder may get the signal to empty before it is full. Very sensitive muscles can also make your bladder squeeze too soon. CAUSES Many things can cause an overactive bladder. Possible causes include:  Urinary tract infection.  Infection of nearby tissues, such as the prostate.  Prostate enlargement.  Being pregnant with twins or more (multiples).  Surgery on the uterus or urethra.  Bladder stones, inflammation, or tumors.  Drinking too much caffeine or alcohol.  Certain medicines,  especially those that you take to help your body get rid of extra fluid (diuretics) by increasing urine production.  Muscle or nerve weakness, especially from:  A spinal cord injury.  Stroke.  Multiple sclerosis.  Parkinson disease.  Diabetes. This can cause a high urine volume that fills the bladder so quickly that the normal urge to urinate is triggered very strongly.  Constipation. A buildup of too much stool can put pressure on your bladder. RISK FACTORS You may be at greater risk for overactive bladder if you:  Are an older adult.  Smoke.  Are going through menopause.  Have prostate problems.  Have a neurological disease, such as stroke, dementia, Parkinson disease, or multiple sclerosis (MS).  Eat or drink things that irritate the bladder. These include alcohol, spicy food, and caffeine.  Are overweight or obese. SIGNS AND SYMPTOMS  The signs and symptoms of an overactive bladder include:  Sudden, strong urges to urinate.  Leaking urine.  Urinating eight or more times per day.  Waking up to urinate two or more times per night. DIAGNOSIS Your health care provider may suspect overactive bladder based on your symptoms. The health care provider will do a physical exam and take your medical history. Blood or urine tests may also be done. For example, you might need to have a bladder function test to check how well you can hold your urine. You might also need to see a health care provider who specializes in the urinary tract (urologist). TREATMENT Treatment for overactive bladder depends on the cause of your condition and whether it is mild or severe. Certain treatments can be done in your health care provider's office or clinic. You can also make lifestyle changes at home. Options include: Behavioral Treatments  Biofeedback. A specialist uses sensors to help you become aware of your body's signals.  Keeping a daily log of when you need to urinate and what happens  after the urge. This may help you manage your condition.  Bladder training. This helps you learn to control the urge to urinate by following a schedule that directs you to urinate at regular intervals (timed voiding). At first, you might have to wait a few minutes after feeling the urge. In time, you should be able to schedule bathroom visits an hour or more apart.  Kegel exercises. These are exercises to strengthen the pelvic floor muscles, which support the bladder. Toning these muscles can help you control urination, even if your bladder muscles are overactive. A specialist will teach you how to do these exercises   correctly. They require daily practice.  Weight loss. If you are obese or overweight, losing weight might relieve your symptoms of overactive bladder. Talk to your health care provider about losing weight and whether there is a specific program or method that would work best for you.  Diet change. This might help if constipation is making your overactive bladder worse. Your health care provider or a dietitian can explain ways to change what you eat to ease constipation. You might also need to consume less alcohol and caffeine or drink other fluids at different times of the day.  Stopping smoking.  Wearing pads to absorb leakage while you wait for other treatments to take effect. Physical Treatments  Electrical stimulation. Electrodes send gentle pulses of electricity to strengthen the nerves or muscles that help to control the bladder. Sometimes, the electrodes are placed outside of the body. In other cases, they might be placed inside the body (implanted). This treatment can take several months to have an effect.  Supportive devices. Women may need a plastic device that fits into the vagina and supports the bladder (pessary). Medicines Several medicines can help treat overactive bladder and are usually used along with other treatments. Some are injected into the muscles involved in  urination. Others come in pill form. Your health care provider may prescribe:  Antispasmodics. These medicines block the signals that the nerves send to the bladder. This keeps the bladder from releasing urine at the wrong time.  Tricyclic antidepressants. These types of antidepressants also relax bladder muscles. Surgery  You may have a device implanted to help manage the nerve signals that indicate when you need to urinate.  You may have surgery to implant electrodes for electrical stimulation.  Sometimes, very severe cases of overactive bladder require surgery to change the shape of the bladder. HOME CARE INSTRUCTIONS   Take medicines only as directed by your health care provider.  Use any implants or a pessary as directed by your health care provider.  Make any diet or lifestyle changes that are recommended by your health care provider. These might include:  Drinking less fluid or drinking at different times of the day. If you need to urinate often during the night, you may need to stop drinking fluids early in the evening.  Cutting down on caffeine or alcohol. Both can make an overactive bladder worse. Caffeine is found in coffee, tea, and sodas.  Doing Kegel exercises to strengthen muscles.  Losing weight if you need to.  Eating a healthy and balanced diet to prevent constipation.  Keep a journal or log to track how much and when you drink and also when you feel the need to urinate. This will help your health care provider to monitor your condition. SEEK MEDICAL CARE IF:  Your symptoms do not get better after treatment.  Your pain and discomfort are getting worse.  You have more frequent urges to urinate.  You have a fever. SEEK IMMEDIATE MEDICAL CARE IF: You are not able to control your bladder at all.   This information is not intended to replace advice given to you by your health care provider. Make sure you discuss any questions you have with your health care  provider.   Document Released: 04/09/2009 Document Revised: 07/04/2014 Document Reviewed: 11/06/2013 Elsevier Interactive Patient Education 2016 [employer] Inc.  

## 2015-04-30 NOTE — Telephone Encounter (Signed)
Last seen 12/22/14  [facility]

## 2015-05-01 LAB — CMP14+EGFR
A/G RATIO: 2 (ref 1.1–2.5)
ALT: 10 IU/L (ref 0–32)
AST: 18 IU/L (ref 0–40)
Albumin: 4.1 g/dL (ref 3.5–4.7)
Alkaline Phosphatase: 84 IU/L (ref 39–117)
BUN/Creatinine Ratio: 19 (ref 11–26)
BUN: 26 mg/dL (ref 8–27)
Bilirubin Total: 0.2 mg/dL (ref 0.0–1.2)
CALCIUM: 9.2 mg/dL (ref 8.7–10.3)
CO2: 22 mmol/L (ref 18–29)
Chloride: 101 mmol/L (ref 97–106)
Creatinine, Ser: 1.35 mg/dL — ABNORMAL HIGH (ref 0.57–1.00)
GFR, EST AFRICAN AMERICAN: 40 mL/min/{1.73_m2} — AB (ref >59)
GFR, EST NON AFRICAN AMERICAN: 35 mL/min/{1.73_m2} — AB (ref >59)
GLOBULIN, TOTAL: 2.1 g/dL (ref 1.5–4.5)
Glucose: 86 mg/dL (ref 65–99)
POTASSIUM: 5 mmol/L (ref 3.5–5.2)
SODIUM: 139 mmol/L (ref 136–144)
TOTAL PROTEIN: 6.2 g/dL (ref 6.0–8.5)

## 2015-05-01 LAB — ANEMIA PROFILE B
BASOS ABS: 0 10*3/uL (ref 0.0–0.2)
Basos: 1 %
EOS (ABSOLUTE): 0.1 10*3/uL (ref 0.0–0.4)
Eos: 2 %
Ferritin: 129 ng/mL (ref 15–150)
Folate: 11.8 ng/mL (ref >3.0)
Hematocrit: 33.2 % — ABNORMAL LOW (ref 34.0–46.6)
Hemoglobin: 11 g/dL — ABNORMAL LOW (ref 11.1–15.9)
IMMATURE GRANULOCYTES: 0 %
IRON: 49 ug/dL (ref 27–139)
Immature Grans (Abs): 0 10*3/uL (ref 0.0–0.1)
Iron Saturation: 20 % (ref 15–55)
LYMPHS: 21 %
Lymphocytes Absolute: 1 10*3/uL (ref 0.7–3.1)
MCH: 30.7 pg (ref 26.6–33.0)
MCHC: 33.1 g/dL (ref 31.5–35.7)
MCV: 93 fL (ref 79–97)
MONOCYTES: 8 %
Monocytes Absolute: 0.4 10*3/uL (ref 0.1–0.9)
NEUTROS PCT: 68 %
Neutrophils Absolute: 3.4 10*3/uL (ref 1.4–7.0)
PLATELETS: 188 10*3/uL (ref 150–379)
RBC: 3.58 x10E6/uL — ABNORMAL LOW (ref 3.77–5.28)
RDW: 13.9 % (ref 12.3–15.4)
RETIC CT PCT: 1.1 % (ref 0.6–2.6)
Total Iron Binding Capacity: 247 ug/dL — ABNORMAL LOW (ref 250–450)
UIBC: 198 ug/dL (ref 118–369)
Vitamin B-12: 256 pg/mL (ref 211–946)
WBC: 5 10*3/uL (ref 3.4–10.8)

## 2015-05-01 LAB — VITAMIN D 25 HYDROXY (VIT D DEFICIENCY, FRACTURES): Vit D, 25-Hydroxy: 52.7 ng/mL (ref 30.0–100.0)

## 2015-05-05 ENCOUNTER — Encounter: Payer: Self-pay | Admitting: *Deleted

## 2015-05-27 ENCOUNTER — Other Ambulatory Visit: Payer: Self-pay | Admitting: Family

## 2015-05-27 DIAGNOSIS — G8929 Other chronic pain: Secondary | ICD-10-CM

## 2015-05-28 MED ORDER — HYDROCODONE-ACETAMINOPHEN 5-325 MG PO TABS: 1.0000 | ORAL_TABLET | Freq: Two times a day (BID) | ORAL | Status: DC | PRN

## 2015-05-28 NOTE — Telephone Encounter (Signed)
Script at front ready for patient.

## 2015-05-28 NOTE — Telephone Encounter (Signed)
RX ready for pick up 

## 2015-05-28 NOTE — Telephone Encounter (Signed)
Last filled 03/30/15, last seen 04/30/15. Rx will print

## 2015-06-25 ENCOUNTER — Telehealth: Payer: Self-pay | Admitting: Family

## 2015-06-25 DIAGNOSIS — G8929 Other chronic pain: Secondary | ICD-10-CM

## 2015-06-25 MED ORDER — HYDROCODONE-ACETAMINOPHEN 5-325 MG PO TABS: 1.0000 | ORAL_TABLET | Freq: Two times a day (BID) | ORAL | Status: DC | PRN

## 2015-06-25 NOTE — Telephone Encounter (Signed)
Patients family aware that rx is ready to be picked up.

## 2015-06-25 NOTE — Telephone Encounter (Signed)
RX ready for pick up 

## 2015-06-30 ENCOUNTER — Other Ambulatory Visit: Payer: Self-pay | Admitting: Family

## 2015-07-01 ENCOUNTER — Telehealth: Payer: Self-pay | Admitting: Family

## 2015-07-01 NOTE — Telephone Encounter (Signed)
Spoke to [employer] EnergyPeggy and pt was given appt with Neysa Bonitohristy tomorrow at 10:25.

## 2015-07-02 ENCOUNTER — Ambulatory Visit (INDEPENDENT_AMBULATORY_CARE_PROVIDER_SITE_OTHER): Payer: Medicare Other | Admitting: Family

## 2015-07-02 ENCOUNTER — Encounter: Payer: Self-pay | Admitting: Family

## 2015-07-02 VITALS — BP 131/67 | HR 73 | Temp 97.7°F

## 2015-07-02 DIAGNOSIS — N3001 Acute cystitis with hematuria: Secondary | ICD-10-CM

## 2015-07-02 DIAGNOSIS — F05 Delirium due to known physiological condition: Secondary | ICD-10-CM

## 2015-07-02 LAB — POCT UA - MICROSCOPIC ONLY
CASTS, UR, LPF, POC: NEGATIVE
Crystals, Ur, HPF, POC: NEGATIVE
MUCUS UA: NEGATIVE
YEAST UA: NEGATIVE

## 2015-07-02 LAB — POCT URINALYSIS DIPSTICK
Bilirubin, UA: NEGATIVE
GLUCOSE UA: NEGATIVE
KETONES UA: NEGATIVE
Leukocytes, UA: NEGATIVE
Nitrite, UA: NEGATIVE
Protein, UA: NEGATIVE
SPEC GRAV UA: 1.025
Urobilinogen, UA: NEGATIVE
pH, UA: 5

## 2015-07-02 MED ORDER — SULFAMETHOXAZOLE-TRIMETHOPRIM 800-160 MG PO TABS: 1.0000 | ORAL_TABLET | Freq: Two times a day (BID) | ORAL | Status: DC

## 2015-07-02 MED ORDER — FLUTICASONE PROPIONATE 50 MCG/ACT NA SUSP: 2.0000 | Freq: Every day | NASAL | Status: DC

## 2015-07-02 NOTE — Progress Notes (Signed)
Subjective:    Patient ID: [name], female    DOB: 03-29-1925, 82 y.o.   MRN: [id number]  Pt presents to the office today for altered mental status that started yesterday. Daughter is present and states the patient woke up yesterday confused and was talking working and daughter states she has not worked in 20 years. Home health nurse is present and states the pt is urinating more frequently, but pt denies any dysuria.  Altered Mental Status This is a new problem. The current episode started yesterday. The problem occurs intermittently. The problem has been waxing and waning. Associated symptoms include urinary symptoms. Pertinent negatives include no anorexia, change in bowel habit, chills, headaches, neck pain or weakness. She has tried nothing for the symptoms. The treatment provided no relief.      Review of Systems  Constitutional: Negative.  Negative for chills.  HENT: Negative.   Eyes: Negative.   Respiratory: Negative.  Negative for shortness of breath.   Cardiovascular: Negative.  Negative for palpitations.  Gastrointestinal: Negative.  Negative for anorexia and change in bowel habit.  Endocrine: Negative.   Genitourinary: Negative.   Musculoskeletal: Negative.  Negative for neck pain.  Neurological: Negative.  Negative for weakness and headaches.  Hematological: Negative.   All other systems reviewed and are negative.      Objective:   Physical Exam  Constitutional: She is oriented to person, place, and time. She appears well-developed and well-nourished. No distress.  HENT:  Head: Normocephalic and atraumatic.  Eyes: Pupils are equal, round, and reactive to light.  Neck: Normal range of motion. Neck supple. No thyromegaly present.  Cardiovascular: Normal rate, regular rhythm and intact distal pulses.   Murmur heard. Pulmonary/Chest: Effort normal and breath sounds normal. No respiratory distress. She has no wheezes.  Abdominal: Soft. Bowel sounds are normal.  She exhibits no distension. There is no tenderness.  Musculoskeletal: Normal range of motion. She exhibits no edema or tenderness.  Negative for CVA tenderness   Neurological: She is alert and oriented to person, place, and time. She has normal reflexes. No cranial nerve deficit.  Skin: Skin is warm and dry.  Psychiatric: She has a normal mood and affect. Her behavior is normal. Judgment and thought content normal.  Vitals reviewed.  Results for orders placed or performed in visit on 07/02/15  POCT urinalysis dipstick  Result Value Ref Range   Color, UA gold    Clarity, UA clear    Glucose, UA neg    Bilirubin, UA neg    Ketones, UA neg    Spec Grav, UA 1.025    Blood, UA trace    pH, UA 5.0    Protein, UA neg    Urobilinogen, UA negative    Nitrite, UA neg    Leukocytes, UA Negative Negative  POCT UA - Microscopic Only  Result Value Ref Range   WBC, Ur, HPF, POC 1-3    RBC, urine, microscopic occ    Bacteria, U Microscopic few    Mucus, UA neg    Epithelial cells, urine per micros few    Crystals, Ur, HPF, POC neg    Casts, Ur, LPF, POC neg    Yeast, UA neg       BP 131/67 mmHg  Pulse 73  Temp(Src) 97.7 F (36.5 C) (Oral)  Wt      Assessment & Plan:  1. Acute confusional state - POCT urinalysis dipstick - POCT UA - Microscopic Only  2. Acute cystitis  with hematuria -Force fluids AZO over the counter X2 days RTO prn Culture pending -Will treat for UTI since patient has trace of blood and confusion -RTO in 2 weeks if not improved or confusion worsens  - sulfamethoxazole-trimethoprim (BACTRIM DS) 800-160 MG tablet; Take 1 tablet by mouth 2 (two) times daily.  Dispense: 14 tablet; Refill: 0  Jannifer Rodney, [name]

## 2015-07-02 NOTE — Addendum Note (Signed)
Addended by: Jannifer RodneyHAWKS, Aggie Douse A on: 07/02/2015 11:15 AM   Modules accepted: Orders

## 2015-07-02 NOTE — Patient Instructions (Signed)

## 2015-07-04 LAB — URINE CULTURE

## 2015-07-20 ENCOUNTER — Ambulatory Visit: Payer: Medicare Other | Admitting: Family

## 2015-07-21 ENCOUNTER — Encounter: Payer: Self-pay | Admitting: Family

## 2015-07-27 ENCOUNTER — Other Ambulatory Visit: Payer: Self-pay | Admitting: Family

## 2015-07-27 DIAGNOSIS — G8929 Other chronic pain: Secondary | ICD-10-CM

## 2015-07-27 MED ORDER — HYDROCODONE-ACETAMINOPHEN 5-325 MG PO TABS: 1.0000 | ORAL_TABLET | Freq: Two times a day (BID) | ORAL | Status: DC | PRN

## 2015-07-27 NOTE — Telephone Encounter (Signed)
Aware,  Script for pain medication is ready.

## 2015-07-27 NOTE — Telephone Encounter (Signed)
Last filled 06/25/15, last seen 07/02/15

## 2015-07-27 NOTE — Telephone Encounter (Signed)
RX ready for pick up 

## 2015-07-28 ENCOUNTER — Ambulatory Visit (INDEPENDENT_AMBULATORY_CARE_PROVIDER_SITE_OTHER): Payer: Medicare Other | Admitting: Family Medicine

## 2015-07-28 DIAGNOSIS — I10 Essential (primary) hypertension: Secondary | ICD-10-CM | POA: Diagnosis not present

## 2015-07-28 DIAGNOSIS — R296 Repeated falls: Secondary | ICD-10-CM

## 2015-07-28 DIAGNOSIS — M4806 Spinal stenosis, lumbar region: Secondary | ICD-10-CM

## 2015-07-28 DIAGNOSIS — G894 Chronic pain syndrome: Secondary | ICD-10-CM | POA: Diagnosis not present

## 2015-07-29 ENCOUNTER — Other Ambulatory Visit: Payer: Self-pay | Admitting: Family

## 2015-07-30 NOTE — Telephone Encounter (Signed)
Last seen 07/02/15  [name]

## 2015-08-25 ENCOUNTER — Encounter: Payer: Self-pay | Admitting: Family

## 2015-08-25 ENCOUNTER — Ambulatory Visit (INDEPENDENT_AMBULATORY_CARE_PROVIDER_SITE_OTHER): Payer: Medicare Other | Admitting: Family

## 2015-08-25 VITALS — BP 111/55 | HR 65 | Temp 97.5°F | Ht 58.5 in | Wt 125.0 lb

## 2015-08-25 DIAGNOSIS — F329 Major depressive disorder, single episode, unspecified: Secondary | ICD-10-CM | POA: Diagnosis not present

## 2015-08-25 DIAGNOSIS — M419 Scoliosis, unspecified: Secondary | ICD-10-CM

## 2015-08-25 DIAGNOSIS — F32A Depression, unspecified: Secondary | ICD-10-CM

## 2015-08-25 DIAGNOSIS — G6289 Other specified polyneuropathies: Secondary | ICD-10-CM

## 2015-08-25 DIAGNOSIS — G8929 Other chronic pain: Secondary | ICD-10-CM

## 2015-08-25 DIAGNOSIS — M48062 Spinal stenosis, lumbar region with neurogenic claudication: Secondary | ICD-10-CM

## 2015-08-25 DIAGNOSIS — N3281 Overactive bladder: Secondary | ICD-10-CM | POA: Diagnosis not present

## 2015-08-25 DIAGNOSIS — J4530 Mild persistent asthma, uncomplicated: Secondary | ICD-10-CM

## 2015-08-25 DIAGNOSIS — M4806 Spinal stenosis, lumbar region: Secondary | ICD-10-CM

## 2015-08-25 DIAGNOSIS — E559 Vitamin D deficiency, unspecified: Secondary | ICD-10-CM

## 2015-08-25 MED ORDER — HYDROCODONE-ACETAMINOPHEN 5-325 MG PO TABS: 1.0000 | ORAL_TABLET | Freq: Two times a day (BID) | ORAL | Status: DC | PRN

## 2015-08-25 MED ORDER — GABAPENTIN 300 MG PO CAPS: 300.0000 mg | ORAL_CAPSULE | Freq: Three times a day (TID) | ORAL | Status: DC

## 2015-08-25 MED ORDER — SOLIFENACIN SUCCINATE 10 MG PO TABS: 10.0000 mg | ORAL_TABLET | Freq: Every day | ORAL | Status: DC

## 2015-08-25 NOTE — Progress Notes (Signed)
Subjective:    Patient ID: [name], female    DOB: 1924/09/10, 82 y.o.   MRN: [id number]  Pt presents to the office today for chronic follow up. Pt states she is not doing "too good". Pt states she can not walk without a walker or wheelchair because her "legs just give out". Pt states she does not believe that PT would not help at this time.  Asthma She complains of hoarse voice and wheezing ("at times"). There is no difficulty breathing, frequent throat clearing or shortness of breath. This is a chronic problem. The current episode started more than 1 year ago. The problem occurs intermittently. The problem has been waxing and waning. Associated symptoms include rhinorrhea. Pertinent negatives include no ear congestion, headaches, nasal congestion or sneezing. Her symptoms are aggravated by any activity. Her symptoms are alleviated by leukotriene antagonist and rest. She reports moderate improvement on treatment. Her past medical history is significant for asthma.  Hypertension This is a chronic problem. The current episode started more than 1 year ago. The problem has been waxing and waning since onset. The problem is uncontrolled. Associated symptoms include anxiety. Pertinent negatives include no headaches, palpitations, peripheral edema or shortness of breath. Risk factors for coronary artery disease include post-menopausal state and sedentary lifestyle. Past treatments include ACE inhibitors and diuretics. The current treatment provides significant improvement. There is no history of kidney disease, CAD/MI, CVA, heart failure or a thyroid problem. There is no history of sleep apnea.  Anxiety Presents for follow-up visit. Onset was more than 5 years ago. The problem has been waxing and waning. Symptoms include depressed mood. Patient reports no excessive worry, impotence, irritability, muscle tension, nervous/anxious behavior, palpitations, panic or shortness of breath. Symptoms occur  rarely.   Her past medical history is significant for asthma and depression. Past treatments include SSRIs. The treatment provided significant relief. Compliance with prior treatments has been good.  Leg Pain  The incident occurred more than 1 week ago. The quality of the pain is described as aching. The pain is at a severity of 8/10. The pain is moderate. The pain has been constant since onset. Associated symptoms include numbness. Treatments tried: gabapentin and norco. The treatment provided moderate relief.  [facility] PT states she was taking myrbetiriq 25 mg every day, but her insurance has stopped paying for it so she needs another medication today.    Review of Systems  Constitutional: Negative.  Negative for irritability.  HENT: Positive for hoarse voice and rhinorrhea. Negative for sneezing.   Eyes: Negative.   Respiratory: Positive for wheezing ("at times"). Negative for shortness of breath.   Cardiovascular: Negative.  Negative for palpitations.  Gastrointestinal: Negative.   Endocrine: Negative.   Genitourinary: Negative.  Negative for impotence.  Musculoskeletal: Negative.   Neurological: Positive for numbness. Negative for headaches.  Hematological: Negative.   Psychiatric/Behavioral: Negative.  The patient is not nervous/anxious.   All other systems reviewed and are negative.      Objective:   Physical Exam  Constitutional: She is oriented to person, place, and time. She appears well-developed and well-nourished. No distress.  HENT:  Head: Normocephalic and atraumatic.  Right Ear: External ear normal.  Left Ear: External ear normal.  Nose: Nose normal.  Mouth/Throat: Oropharynx is clear and moist.  Eyes: Pupils are equal, round, and reactive to light.  Neck: Normal range of motion. Neck supple. No thyromegaly present.  Cardiovascular: Normal rate and intact distal pulses.   Murmur heard. Irregular rhythm  Pulmonary/Chest: Effort normal. No respiratory distress. She  has no wheezes.  Diminished breath sounds   Abdominal: Soft. Bowel sounds are normal. She exhibits no distension. There is no tenderness.  Musculoskeletal: Normal range of motion. She exhibits no edema or tenderness.  Mild lower extremity weakness   Neurological: She is alert and oriented to person, place, and time. She has normal reflexes. No cranial nerve deficit.  Skin: Skin is warm and dry.  Psychiatric: She has a normal mood and affect. Her behavior is normal. Judgment and thought content normal.  Vitals reviewed.   BP 111/55 mmHg  Pulse 65  Temp(Src) 97.5 F (36.4 C) (Oral)  Ht 4' 10.5" (1.486 m)  Wt 125 lb (56.7 kg)  BMI 25.68 kg/m2       Assessment & Plan:  1. Vitamin D deficiency - CMP14+EGFR  2. Depression - CMP14+EGFR  3. Spinal stenosis, lumbar region, with neurogenic claudication - CMP14+EGFR  4. Chronic pain - CMP14+EGFR  5. Overactive bladder -Pt's myrbetriq stopped today and vesicare 10 mg today - CMP14+EGFR - solifenacin (VESICARE) 10 MG tablet; Take 1 tablet (10 mg total) by mouth daily.  Dispense: 90 tablet; Refill: 1  6. Scoliosis - CMP14+EGFR  7. Other polyneuropathy ([place]) -Pts gabapentin increased to 3,300 mg daily - CMP14+EGFR - gabapentin (NEURONTIN) 300 MG capsule; Take 1 capsule (300 mg total) by mouth 3 (three) times daily.  Dispense: 90 capsule; Refill: 3  8. Asthma, mild persistent, uncomplicated - YYY15+TXQH   Continue all meds Labs pending Health Maintenance reviewed Diet and exercise encouraged RTO 6 months  [name], [name]

## 2015-08-25 NOTE — Patient Instructions (Signed)
Health Maintenance, Female Adopting a healthy lifestyle and getting preventive care can go a long way to promote health and wellness. Talk with your health care provider about what schedule of regular examinations is right for you. This is a good chance for you to check in with your provider about disease prevention and staying healthy. In between checkups, there are plenty of things you can do on your own. Experts have done a lot of research about which lifestyle changes and preventive measures are most likely to keep you healthy. Ask your health care provider for more information. WEIGHT AND DIET  Eat a healthy diet  Be sure to include plenty of vegetables, fruits, low-fat dairy products, and lean protein.  Do not eat a lot of foods high in solid fats, added sugars, or salt.  Get regular exercise. This is one of the most important things you can do for your health.  Most adults should exercise for at least 150 minutes each week. The exercise should increase your heart rate and make you sweat (moderate-intensity exercise).  Most adults should also do strengthening exercises at least twice a week. This is in addition to the moderate-intensity exercise.  Maintain a healthy weight  Body mass index (BMI) is a measurement that can be used to identify possible weight problems. It estimates body fat based on height and weight. Your health care provider can help determine your BMI and help you achieve or maintain a healthy weight.  For females 20 years of age and older:   A BMI below 18.5 is considered underweight.  A BMI of 18.5 to 24.9 is normal.  A BMI of 25 to 29.9 is considered overweight.  A BMI of 30 and above is considered obese.  Watch levels of cholesterol and blood lipids  You should start having your blood tested for lipids and cholesterol at 80 years of age, then have this test every 5 years.  You may need to have your cholesterol levels checked more often if:  Your lipid  or cholesterol levels are high.  You are older than 80 years of age.  You are at high risk for heart disease.  CANCER SCREENING   Lung Cancer  Lung cancer screening is recommended for adults 55-80 years old who are at high risk for lung cancer because of a history of smoking.  A yearly low-dose CT scan of the lungs is recommended for people who:  Currently smoke.  Have quit within the past 15 years.  Have at least a 30-pack-year history of smoking. A pack year is smoking an average of one pack of cigarettes a day for 1 year.  Yearly screening should continue until it has been 15 years since you quit.  Yearly screening should stop if you develop a health problem that would prevent you from having lung cancer treatment.  Breast Cancer  Practice breast self-awareness. This means understanding how your breasts normally appear and feel.  It also means doing regular breast self-exams. Let your health care provider know about any changes, no matter how small.  If you are in your 20s or 30s, you should have a clinical breast exam (CBE) by a health care provider every 1-3 years as part of a regular health exam.  If you are 40 or older, have a CBE every year. Also consider having a breast X-ray (mammogram) every year.  If you have a family history of breast cancer, talk to your health care provider about genetic screening.  If you   are at high risk for breast cancer, talk to your health care provider about having an MRI and a mammogram every year.  Breast cancer gene (BRCA) assessment is recommended for women who have family members with BRCA-related cancers. BRCA-related cancers include:  Breast.  Ovarian.  Tubal.  Peritoneal cancers.  Results of the assessment will determine the need for genetic counseling and BRCA1 and BRCA2 testing. Cervical Cancer Your health care provider may recommend that you be screened regularly for cancer of the pelvic organs (ovaries, uterus, and  vagina). This screening involves a pelvic examination, including checking for microscopic changes to the surface of your cervix (Pap test). You may be encouraged to have this screening done every 3 years, beginning at age 21.  For women ages 30-65, health care providers may recommend pelvic exams and Pap testing every 3 years, or they may recommend the Pap and pelvic exam, combined with testing for human papilloma virus (HPV), every 5 years. Some types of HPV increase your risk of cervical cancer. Testing for HPV may also be done on women of any age with unclear Pap test results.  Other health care providers may not recommend any screening for nonpregnant women who are considered low risk for pelvic cancer and who do not have symptoms. Ask your health care provider if a screening pelvic exam is right for you.  If you have had past treatment for cervical cancer or a condition that could lead to cancer, you need Pap tests and screening for cancer for at least 20 years after your treatment. If Pap tests have been discontinued, your risk factors (such as having a new sexual partner) need to be reassessed to determine if screening should resume. Some women have medical problems that increase the chance of getting cervical cancer. In these cases, your health care provider may recommend more frequent screening and Pap tests. Colorectal Cancer  This type of cancer can be detected and often prevented.  Routine colorectal cancer screening usually begins at 80 years of age and continues through 80 years of age.  Your health care provider may recommend screening at an earlier age if you have risk factors for colon cancer.  Your health care provider may also recommend using home test kits to check for hidden blood in the stool.  A small camera at the end of a tube can be used to examine your colon directly (sigmoidoscopy or colonoscopy). This is done to check for the earliest forms of colorectal  cancer.  Routine screening usually begins at age 50.  Direct examination of the colon should be repeated every 5-10 years through 80 years of age. However, you may need to be screened more often if early forms of precancerous polyps or small growths are found. Skin Cancer  Check your skin from head to toe regularly.  Tell your health care provider about any new moles or changes in moles, especially if there is a change in a mole's shape or color.  Also tell your health care provider if you have a mole that is larger than the size of a pencil eraser.  Always use sunscreen. Apply sunscreen liberally and repeatedly throughout the day.  Protect yourself by wearing long sleeves, pants, a wide-brimmed hat, and sunglasses whenever you are outside. HEART DISEASE, DIABETES, AND HIGH BLOOD PRESSURE   High blood pressure causes heart disease and increases the risk of stroke. High blood pressure is more likely to develop in:  People who have blood pressure in the high end   of the normal range (130-139/85-89 mm Hg).  People who are overweight or obese.  People who are African American.  If you are 38-23 years of age, have your blood pressure checked every 3-5 years. If you are 61 years of age or older, have your blood pressure checked every year. You should have your blood pressure measured twice--once when you are at a hospital or clinic, and once when you are not at a hospital or clinic. Record the average of the two measurements. To check your blood pressure when you are not at a hospital or clinic, you can use:  An automated blood pressure machine at a pharmacy.  A home blood pressure monitor.  If you are between 45 years and 39 years old, ask your health care provider if you should take aspirin to prevent strokes.  Have regular diabetes screenings. This involves taking a blood sample to check your fasting blood sugar level.  If you are at a normal weight and have a low risk for diabetes,  have this test once every three years after 80 years of age.  If you are overweight and have a high risk for diabetes, consider being tested at a younger age or more often. PREVENTING INFECTION  Hepatitis B  If you have a higher risk for hepatitis B, you should be screened for this virus. You are considered at high risk for hepatitis B if:  You were born in a country where hepatitis B is common. Ask your health care provider which countries are considered high risk.  Your parents were born in a high-risk country, and you have not been immunized against hepatitis B (hepatitis B vaccine).  You have HIV or AIDS.  You use needles to inject street drugs.  You live with someone who has hepatitis B.  You have had sex with someone who has hepatitis B.  You get hemodialysis treatment.  You take certain medicines for conditions, including cancer, organ transplantation, and autoimmune conditions. Hepatitis C  Blood testing is recommended for:  Everyone born from [birth year] through 1963.  Anyone with known risk factors for hepatitis C. Sexually transmitted infections (STIs)  You should be screened for sexually transmitted infections (STIs) including gonorrhea and chlamydia if:  You are sexually active and are younger than 80 years of age.  You are older than 80 years of age and your health care provider tells you that you are at risk for this type of infection.  Your sexual activity has changed since you were last screened and you are at an increased risk for chlamydia or gonorrhea. Ask your health care provider if you are at risk.  If you do not have HIV, but are at risk, it may be recommended that you take a prescription medicine daily to prevent HIV infection. This is called pre-exposure prophylaxis (PrEP). You are considered at risk if:  You are sexually active and do not regularly use condoms or know the HIV status of your partner(s).  You take drugs by injection.  You are sexually  active with a partner who has HIV. Talk with your health care provider about whether you are at high risk of being infected with HIV. If you choose to begin PrEP, you should first be tested for HIV. You should then be tested every 3 months for as long as you are taking PrEP.  PREGNANCY   If you are premenopausal and you may become pregnant, ask your health care provider about preconception counseling.  If you may  become pregnant, take 400 to 800 micrograms (mcg) of folic acid every day.  If you want to prevent pregnancy, talk to your health care provider about birth control (contraception). OSTEOPOROSIS AND MENOPAUSE   Osteoporosis is a disease in which the bones lose minerals and strength with aging. This can result in serious bone fractures. Your risk for osteoporosis can be identified using a bone density scan.  If you are 61 years of age or older, or if you are at risk for osteoporosis and fractures, ask your health care provider if you should be screened.  Ask your health care provider whether you should take a calcium or vitamin D supplement to lower your risk for osteoporosis.  Menopause may have certain physical symptoms and risks.  Hormone replacement therapy may reduce some of these symptoms and risks. Talk to your health care provider about whether hormone replacement therapy is right for you.  HOME CARE INSTRUCTIONS   Schedule regular health, dental, and eye exams.  Stay current with your immunizations.   Do not use any tobacco products including cigarettes, chewing tobacco, or electronic cigarettes.  If you are pregnant, do not drink alcohol.  If you are breastfeeding, limit how much and how often you drink alcohol.  Limit alcohol intake to no more than 1 drink per day for nonpregnant women. One drink equals 12 ounces of beer, 5 ounces of wine, or 1 ounces of hard liquor.  Do not use street drugs.  Do not share needles.  Ask your health care provider for help if  you need support or information about quitting drugs.  Tell your health care provider if you often feel depressed.  Tell your health care provider if you have ever been abused or do not feel safe at home.   This information is not intended to replace advice given to you by your health care provider. Make sure you discuss any questions you have with your health care provider.   Document Released: 12/27/2010 Document Revised: 07/04/2014 Document Reviewed: 05/15/2013 Elsevier Interactive Patient Education [employer].

## 2015-08-25 NOTE — Addendum Note (Signed)
Addended by: Almeta Monas on: 08/25/2015 03:10 PM   Modules accepted: Orders

## 2015-08-26 LAB — CMP14+EGFR
ALBUMIN: 4.2 g/dL (ref 3.5–4.7)
ALT: 8 IU/L (ref 0–32)
AST: 15 IU/L (ref 0–40)
Albumin/Globulin Ratio: 2.3 (ref 1.1–2.5)
Alkaline Phosphatase: 86 IU/L (ref 39–117)
BUN / CREAT RATIO: 21 (ref 11–26)
BUN: 24 mg/dL (ref 8–27)
CO2: 21 mmol/L (ref 18–29)
CREATININE: 1.15 mg/dL — AB (ref 0.57–1.00)
Calcium: 9.1 mg/dL (ref 8.7–10.3)
Chloride: 106 mmol/L (ref 96–106)
GFR calc non Af Amer: 43 mL/min/{1.73_m2} — ABNORMAL LOW (ref >59)
GFR, EST AFRICAN AMERICAN: 49 mL/min/{1.73_m2} — AB (ref >59)
GLOBULIN, TOTAL: 1.8 g/dL (ref 1.5–4.5)
GLUCOSE: 100 mg/dL — AB (ref 65–99)
Potassium: 4.8 mmol/L (ref 3.5–5.2)
Sodium: 144 mmol/L (ref 134–144)
TOTAL PROTEIN: 6 g/dL (ref 6.0–8.5)

## 2015-09-14 ENCOUNTER — Telehealth: Payer: Self-pay | Admitting: Internal Medicine

## 2015-09-14 NOTE — Telephone Encounter (Signed)
lmtcb X1 for [name]. Pt has not been seen since 2014- needs to defer to pcp or re-establish with practice.

## 2015-09-15 NOTE — Telephone Encounter (Signed)
[name] notified that she will need to get these medications refilled through PCP since we have not seen patient since 2014.   [name] refused to have patient re-established stating that "patient is 82 years old and hard to travel with" Nothing further needed.

## 2015-09-25 ENCOUNTER — Other Ambulatory Visit: Payer: Self-pay | Admitting: Family

## 2015-09-25 DIAGNOSIS — G8929 Other chronic pain: Secondary | ICD-10-CM

## 2015-09-25 MED ORDER — HYDROCODONE-ACETAMINOPHEN 5-325 MG PO TABS: 1.0000 | ORAL_TABLET | Freq: Two times a day (BID) | ORAL | Status: DC | PRN

## 2015-09-25 NOTE — Telephone Encounter (Signed)
Pt is aware.  

## 2015-09-25 NOTE — Telephone Encounter (Signed)
RX ready for pick up 

## 2015-10-16 ENCOUNTER — Telehealth: Payer: Self-pay | Admitting: Family

## 2015-10-16 ENCOUNTER — Telehealth: Payer: Self-pay | Admitting: *Deleted

## 2015-10-16 MED ORDER — BUDESONIDE 0.5 MG/2ML IN SUSP: 0.5000 mg | Freq: Two times a day (BID) | RESPIRATORY_TRACT | Status: DC

## 2015-10-16 MED ORDER — ARFORMOTEROL TARTRATE 15 MCG/2ML IN NEBU: 15.0000 ug | INHALATION_SOLUTION | Freq: Two times a day (BID) | RESPIRATORY_TRACT | Status: AC

## 2015-10-16 NOTE — Telephone Encounter (Signed)
Aware, scripts were sent in. 

## 2015-10-16 NOTE — Telephone Encounter (Signed)
Prescription sent to pharmacy.

## 2015-10-16 NOTE — Telephone Encounter (Signed)
Patient's daughter([name]) states that patient has been seeing Dr. Sherene SiresWert for  Nebulizer and bravana.   Gigi Gineggy states that they would like to start getting rx from our office

## 2015-10-20 ENCOUNTER — Telehealth: Payer: Self-pay

## 2015-10-20 NOTE — Telephone Encounter (Signed)
x

## 2015-10-25 ENCOUNTER — Ambulatory Visit (INDEPENDENT_AMBULATORY_CARE_PROVIDER_SITE_OTHER): Payer: Medicare Other | Admitting: Family Medicine

## 2015-10-25 DIAGNOSIS — M4806 Spinal stenosis, lumbar region: Secondary | ICD-10-CM

## 2015-10-25 DIAGNOSIS — G894 Chronic pain syndrome: Secondary | ICD-10-CM | POA: Diagnosis not present

## 2015-10-25 DIAGNOSIS — I10 Essential (primary) hypertension: Secondary | ICD-10-CM | POA: Diagnosis not present

## 2015-10-25 DIAGNOSIS — R296 Repeated falls: Secondary | ICD-10-CM | POA: Diagnosis not present

## 2015-10-27 ENCOUNTER — Telehealth: Payer: Self-pay | Admitting: Family

## 2015-10-27 ENCOUNTER — Other Ambulatory Visit: Payer: Self-pay | Admitting: Family

## 2015-10-27 DIAGNOSIS — G8929 Other chronic pain: Secondary | ICD-10-CM

## 2015-10-27 MED ORDER — HYDROCODONE-ACETAMINOPHEN 5-325 MG PO TABS: 1.0000 | ORAL_TABLET | Freq: Two times a day (BID) | ORAL | Status: DC | PRN

## 2015-10-27 NOTE — Telephone Encounter (Signed)
Patient aware rx ready to be picked up 

## 2015-10-27 NOTE — Telephone Encounter (Signed)
RX ready for pick up 

## 2015-11-18 ENCOUNTER — Ambulatory Visit (INDEPENDENT_AMBULATORY_CARE_PROVIDER_SITE_OTHER): Payer: Medicare Other | Admitting: Internal Medicine

## 2015-11-18 ENCOUNTER — Encounter: Payer: Self-pay | Admitting: Internal Medicine

## 2015-11-18 VITALS — BP 128/74 | HR 63 | Ht 61.5 in | Wt 131.0 lb

## 2015-11-18 DIAGNOSIS — J4530 Mild persistent asthma, uncomplicated: Secondary | ICD-10-CM

## 2015-11-18 NOTE — Patient Instructions (Signed)
No need to change your medications for now - call if any problem refilling nebs / solutions  Your blood pressure pill may cause throat congestion/ cough and if develops need to let your primary provider select a sustitute

## 2015-11-18 NOTE — Progress Notes (Signed)
Subjective:    Patient ID: [name], female    DOB: 03/26/1925   MRN: [id number]     Primary Provider/Referring Provider: Neysa Bonitohristy Hawks/Dr Christell ConstantMoore   Brief patient profile:  [id number] yowf never smoker, with new onset asthma like symptoms Dec 2007 she did undergo PFTs 03/09/2007 indicating very mild airflow obstruction with an FEV1 improved 20% after bronchodilators and a normal diffusing capacity c/w asthma and responded nicely to LABA/ICS  nebs as couldn't use hfa effectively.     History of Present Illness  September 23, 2008 - 2 week follow up visit and med calendar check. Pt began Brovana and Budesonide 2 weeks ago; Symbicort was D/C'ed. Pt states that she "has never felt better and was able to sleep through the night" after using the Brovana and Budesonide neb tx.    May 28, 2009 ov no c/o's but had 55 min < 89% sat by overnight pulse ox. no nocturnal co's but only using neb once in am, not in pm > declined 02    05/21/2013  "new pt"ov/Erick Oxendine re: re-establish / no longer using med calendar Chief Complaint  Patient presents with  . Pulmonary Consult    Pt last seen 2010. She states having increased wheezing and SOB for the past 2 wks.   Not on ppi at all though denies overt HB or dysphagia  Worse since neb stopped working and relying on intermittent neb rx per son. rec For any flare of cough / wheeze/ short breath > Try prilosec 20mg   Take 30-60 min before first meal of the day and Pepcid 20 mg one bedtime until better for at least a week then stop GERD   Please see patient coordinator before you leave today  to schedule home and neb and your medications (part B Medicare)    11/18/2015  f/u ov/Dejah Droessler re:  Chronic asthma maint rx pulmocort/ performist  Chief Complaint  Patient presents with  . Follow-up    Needing refills on brovana and pulmicort.   has walker, barely able to walk across room, legs give out before breath No noct or am cough/ congestion   No obvious other patterns  in  day to day or daytime variabilty or assoc excess/ purulent sputum or mucus plugs  or cp or chest tightness, subjective wheeze overt sinus or hb symptoms. No unusual exp hx or h/o childhood pna/ asthma or knowledge of premature birth.  Sleeping ok without nocturnal  or early am exacerbation  of respiratory  c/o's or need for noct saba. Also denies any obvious fluctuation of symptoms with weather or environmental changes or other aggravating or alleviating factors except as outlined above   Current Medications, Allergies, Complete Past Medical History, Past Surgical History, Family History, and Social History were reviewed in [facility] electronic medical record.  ROS  The following are not active complaints unless bolded sore throat, dysphagia, dental problems, itching, sneezing,  nasal congestion or excess/ purulent secretions, ear ache,   fever, chills, sweats, unintended wt loss, pleuritic or exertional cp, hemoptysis,  orthopnea pnd or leg swelling, presyncope, palpitations, heartburn, abdominal pain, anorexia, nausea, vomiting, diarrhea  or change in bowel or urinary habits, change in stools or urine, dysuria,hematuria,  rash, arthralgias, visual complaints, headache, numbness weakness or ataxia related to neuropathy or problems with walking or coordination,  change in mood/affect or memory.       Past Medical History:  ESOPHAGEAL REFLUX (ICD-530.81)  ASTHMA (ICD-493.90).................................................Marland Kitchen.Atwell Mcdanel  Objective:   Physical Exam   W/c bound pleasant  elderly wf weathered facies nad  11/18/2015        131  05/21/13 138 lb (62.596 kg)  04/02/13 131 lb 12.8 oz (59.784 kg)  02/18/13 125 lb 6.4 oz (56.881 kg)   wt 119 December 08, 2008 > 123 May 28, 2009     HEENT:  Edentulous with dentures in place  nl turbinates, and orophanx. Nl external ear canals without cough reflex   NECK :  without JVD/Nodes/TM/ nl carotid upstrokes  bilaterally   LUNGS: no acc muscle use, clear to A and P bilaterally without cough on insp or exp maneuvers   CV:  RRR  no s3 or murmur or increase in P2, no edema   ABD:  soft and nontender with nl excursion in the supine position. No bruits or organomegaly, bowel sounds nl  MS:  warm without deformities, calf tenderness, cyanosis or clubbing  SKIN: warm and dry without lesions    NEURO:  alert, approp, no deficits       Assessment & Plan:

## 2015-11-19 NOTE — Assessment & Plan Note (Addendum)
-  PFTs 03/09/2007 indicating very mild airflow obstruction with an FEV1 improved 20% after bronchodilators and a normal diffusing capacity   She has done remarkably well on the neb version of laba/ics and no reason to change rx but if no longer qualifies did show her the elipta device for laba/ics (ie Breo) if insurance won't cover the neb at this point  On present rx:  All goals of chronic asthma control met including optimal function and elimination of symptoms with minimal need for rescue therapy.  Contingencies discussed in full including contacting this office immediately if not controlling the symptoms using the rule of two's.    I had an extended discussion with the patient and daughter reviewing all relevant studies completed to date and  lasting 15 to 20 minutes of a 25 minute visit    - The proper method of use, as well as anticipated side effects, of a metered-dose inhaler are discussed and demonstrated to the patient - seemed to understand the elipta ok but will need observation if change over to this device vs the neb to make sure she uses it effectively and consistently each am   Each maintenance medication was reviewed in detail including most importantly the difference between maintenance and prns and under what circumstances the prns are to be triggered using an action plan format that is not reflected in the computer generated alphabetically organized AVS.    Please see instructions for details which were reviewed in writing and the patient given a copy highlighting the part that I personally wrote and discussed at today's ov.

## 2015-11-24 ENCOUNTER — Other Ambulatory Visit: Payer: Self-pay | Admitting: Family

## 2015-11-24 DIAGNOSIS — G8929 Other chronic pain: Secondary | ICD-10-CM

## 2015-11-24 MED ORDER — HYDROCODONE-ACETAMINOPHEN 5-325 MG PO TABS: 1.0000 | ORAL_TABLET | Freq: Two times a day (BID) | ORAL | Status: DC | PRN

## 2015-11-24 NOTE — Telephone Encounter (Signed)
Patient aware rx ready to be picked up 

## 2015-11-24 NOTE — Telephone Encounter (Signed)
RX ready for pick up 

## 2015-11-24 NOTE — Telephone Encounter (Signed)
Last filled 10/27/15, last seen 08/25/15

## 2015-11-25 ENCOUNTER — Other Ambulatory Visit: Payer: Self-pay | Admitting: Family

## 2015-12-05 ENCOUNTER — Ambulatory Visit (INDEPENDENT_AMBULATORY_CARE_PROVIDER_SITE_OTHER): Payer: Medicare Other | Admitting: Family Medicine

## 2015-12-05 VITALS — BP 121/67 | HR 69 | Temp 97.5°F | Ht 61.5 in | Wt 131.0 lb

## 2015-12-05 DIAGNOSIS — L0390 Cellulitis, unspecified: Secondary | ICD-10-CM

## 2015-12-05 DIAGNOSIS — L0291 Cutaneous abscess, unspecified: Secondary | ICD-10-CM

## 2015-12-05 MED ORDER — MUPIROCIN 2 % EX OINT: 1.0000 "application " | TOPICAL_OINTMENT | Freq: Two times a day (BID) | CUTANEOUS | Status: DC

## 2015-12-05 MED ORDER — DOXYCYCLINE HYCLATE 100 MG PO TABS: 100.0000 mg | ORAL_TABLET | Freq: Two times a day (BID) | ORAL | Status: DC

## 2015-12-05 NOTE — Progress Notes (Signed)
   HPI  Patient presents today here with a boil on her buttock.  Patient and her daughter explained that over the last week she's had a persistent boil on her buttock.  She describes that it's tender and warm. The daughters concerned because there is such a large firm red area around it.   Patient denies chills, sweats, or feeling ill. She's tolerating food and fluids normally. She is breathing normally.  The area has been draining some thick white fluid. They've been using hot compresses routinely.  PMH: Smoking status noted ROS: Per HPI  Objective: BP 121/67 mmHg  Pulse 69  Temp(Src) 97.5 F (36.4 C) (Oral)  Ht 5' 1.5" (1.562 m)  Wt 131 lb (59.421 kg)  BMI 24.35 kg/m2 Gen: NAD, alert, cooperative with exam HEENT: NCAT CV: RRR Resp: CTABL, no wheezes, non-labored Ext: No edema, warm Neuro: Alert and oriented, No gross deficits  Left-sided gluteal cleft with approximately 1 cm roughly circular lesion with a surrounding area of erythema, warmth, and induration measuring 5 cm x 7 cm, the longest tail of the induration is extending parallel to the gluteal cleft. Is on the left side separated by at least 5 cm from the rectum, it is not in the area of a pilonidal cyst There is no drainage today, however it is open  Lennox Grumblesebbie Boles present with me for exam.   Assessment and plan:  # Abscess with cellulitis I doubt that there is a deep abscess given the draining that she's had over the last week and texture of the abscess. She does have a large area of induration and erythema consistent with cellulitis Doxycycline, mupirocin for wound dressing Twice a day mupirocin Continue warm compresses Return to clinic in 5-6 days if not improving as expected for possible I&D if necessary Even location is unlikely to be considered a perirectal abscess or pilonidal cyst. It is on the left side of the upper end of the gluteal cleft    Meds ordered this encounter  Medications  .  doxycycline (VIBRA-TABS) 100 MG tablet    Sig: Take 1 tablet (100 mg total) by mouth 2 (two) times daily. 1 po bid    Dispense:  20 tablet    Refill:  0  . mupirocin ointment (BACTROBAN) 2 %    Sig: Place 1 application into the nose 2 (two) times daily.    Dispense:  22 g    Refill:  0    Murtis SinkSam Offie Waide, MD [facility] 12/05/2015, 11:02 AM

## 2015-12-24 ENCOUNTER — Other Ambulatory Visit: Payer: Self-pay | Admitting: Family

## 2015-12-24 DIAGNOSIS — G8929 Other chronic pain: Secondary | ICD-10-CM

## 2015-12-24 MED ORDER — HYDROCODONE-ACETAMINOPHEN 5-325 MG PO TABS: 1.0000 | ORAL_TABLET | Freq: Two times a day (BID) | ORAL | Status: DC | PRN

## 2015-12-24 NOTE — Telephone Encounter (Signed)
Last filled 11/24/15, last seen 08/25/15

## 2015-12-24 NOTE — Telephone Encounter (Signed)
RX ready for pick up 

## 2015-12-24 NOTE — Telephone Encounter (Signed)
Left detailed message on pt's voicemail stating rx is up front and ready to be picked up.

## 2016-01-06 ENCOUNTER — Ambulatory Visit (INDEPENDENT_AMBULATORY_CARE_PROVIDER_SITE_OTHER): Payer: Medicare Other | Admitting: Physician Assistant

## 2016-01-06 ENCOUNTER — Encounter: Payer: Self-pay | Admitting: Physician Assistant

## 2016-01-06 VITALS — BP 131/63 | HR 66 | Temp 97.7°F | Ht 61.5 in

## 2016-01-06 DIAGNOSIS — R300 Dysuria: Secondary | ICD-10-CM | POA: Diagnosis not present

## 2016-01-06 LAB — URINALYSIS, COMPLETE
BILIRUBIN UA: NEGATIVE
GLUCOSE, UA: NEGATIVE
Ketones, UA: NEGATIVE
LEUKOCYTES UA: NEGATIVE
Nitrite, UA: NEGATIVE
PROTEIN UA: NEGATIVE
RBC UA: NEGATIVE
SPEC GRAV UA: 1.02 (ref 1.005–1.030)
UUROB: 0.2 mg/dL (ref 0.2–1.0)
pH, UA: 6 (ref 5.0–7.5)

## 2016-01-06 LAB — MICROSCOPIC EXAMINATION: RBC, UA: NONE SEEN /hpf (ref 0–?)

## 2016-01-06 NOTE — Progress Notes (Signed)
Subjective:     Patient ID: [name], female   DOB: 11/03/1924, 82 y.o.   MRN: [id number]  HPI Pt with some dysuria since Monday Family became concerned due to some change in mentation This has prev has been a sx of UTI She is on Cipro twice weekly to prevent these The family then started her on daily meds and all sx have improved  Review of Systems  Constitutional: Negative.   Gastrointestinal: Negative for abdominal pain, diarrhea, constipation and abdominal distention.  Genitourinary: Positive for dysuria, difficulty urinating and pelvic pain. Negative for urgency, frequency, hematuria, flank pain, decreased urine volume and genital sores.  Psychiatric/Behavioral: Positive for hallucinations and confusion.       Objective:   Physical Exam  Constitutional: She appears well-developed and well-nourished.  Cardiovascular: Normal rate, regular rhythm and normal heart sounds.   Pulmonary/Chest: Effort normal and breath sounds normal.  Abdominal: Soft. Bowel sounds are normal. She exhibits no distension and no mass. There is no tenderness. There is no rebound and no guarding.  Nursing note and vitals reviewed. UA- see labs     Assessment:     1. Dysuria        Plan:     Discussed with family UA looks good today Will hold further ATB at this time and observe If sx return f/u for further labs

## 2016-01-06 NOTE — Patient Instructions (Signed)

## 2016-01-18 ENCOUNTER — Ambulatory Visit (INDEPENDENT_AMBULATORY_CARE_PROVIDER_SITE_OTHER): Payer: Medicare Other | Admitting: Family

## 2016-01-18 ENCOUNTER — Encounter: Payer: Self-pay | Admitting: Family

## 2016-01-18 VITALS — BP 137/67 | HR 58 | Temp 97.7°F | Ht 61.5 in | Wt 119.0 lb

## 2016-01-18 DIAGNOSIS — M4806 Spinal stenosis, lumbar region: Secondary | ICD-10-CM | POA: Diagnosis not present

## 2016-01-18 DIAGNOSIS — F32A Depression, unspecified: Secondary | ICD-10-CM

## 2016-01-18 DIAGNOSIS — N3281 Overactive bladder: Secondary | ICD-10-CM | POA: Diagnosis not present

## 2016-01-18 DIAGNOSIS — G8929 Other chronic pain: Secondary | ICD-10-CM | POA: Diagnosis not present

## 2016-01-18 DIAGNOSIS — M48062 Spinal stenosis, lumbar region with neurogenic claudication: Secondary | ICD-10-CM

## 2016-01-18 DIAGNOSIS — G6289 Other specified polyneuropathies: Secondary | ICD-10-CM

## 2016-01-18 DIAGNOSIS — R4182 Altered mental status, unspecified: Secondary | ICD-10-CM | POA: Diagnosis not present

## 2016-01-18 DIAGNOSIS — F329 Major depressive disorder, single episode, unspecified: Secondary | ICD-10-CM | POA: Diagnosis not present

## 2016-01-18 DIAGNOSIS — J4530 Mild persistent asthma, uncomplicated: Secondary | ICD-10-CM

## 2016-01-18 LAB — CBC WITH DIFFERENTIAL/PLATELET
BASOS ABS: 0 10*3/uL (ref 0.0–0.2)
Basos: 0 %
EOS (ABSOLUTE): 0.1 10*3/uL (ref 0.0–0.4)
Eos: 1 %
Hematocrit: 33 % — ABNORMAL LOW (ref 34.0–46.6)
Hemoglobin: 10.5 g/dL — ABNORMAL LOW (ref 11.1–15.9)
Immature Grans (Abs): 0 10*3/uL (ref 0.0–0.1)
Immature Granulocytes: 0 %
LYMPHS ABS: 1 10*3/uL (ref 0.7–3.1)
Lymphs: 13 %
MCH: 28.9 pg (ref 26.6–33.0)
MCHC: 31.8 g/dL (ref 31.5–35.7)
MCV: 91 fL (ref 79–97)
MONOS ABS: 0.5 10*3/uL (ref 0.1–0.9)
Monocytes: 7 %
NEUTROS ABS: 5.6 10*3/uL (ref 1.4–7.0)
Neutrophils: 79 %
PLATELETS: 192 10*3/uL (ref 150–379)
RBC: 3.63 x10E6/uL — ABNORMAL LOW (ref 3.77–5.28)
RDW: 14.8 % (ref 12.3–15.4)
WBC: 7.1 10*3/uL (ref 3.4–10.8)

## 2016-01-18 LAB — CMP14+EGFR
ALT: 10 IU/L (ref 0–32)
AST: 12 IU/L (ref 0–40)
Albumin/Globulin Ratio: 2 (ref 1.2–2.2)
Albumin: 3.9 g/dL (ref 3.5–4.7)
Alkaline Phosphatase: 77 IU/L (ref 39–117)
BILIRUBIN TOTAL: 0.3 mg/dL (ref 0.0–1.2)
BUN / CREAT RATIO: 21 (ref 12–28)
BUN: 27 mg/dL (ref 8–27)
CALCIUM: 9.2 mg/dL (ref 8.7–10.3)
CHLORIDE: 104 mmol/L (ref 96–106)
CO2: 23 mmol/L (ref 18–29)
Creatinine, Ser: 1.31 mg/dL — ABNORMAL HIGH (ref 0.57–1.00)
GFR calc non Af Amer: 36 mL/min/{1.73_m2} — ABNORMAL LOW (ref >59)
GFR, EST AFRICAN AMERICAN: 42 mL/min/{1.73_m2} — AB (ref >59)
GLOBULIN, TOTAL: 2 g/dL (ref 1.5–4.5)
GLUCOSE: 76 mg/dL (ref 65–99)
POTASSIUM: 4.5 mmol/L (ref 3.5–5.2)
Sodium: 142 mmol/L (ref 134–144)
Total Protein: 5.9 g/dL — ABNORMAL LOW (ref 6.0–8.5)

## 2016-01-18 LAB — URINALYSIS, COMPLETE
Bilirubin, UA: NEGATIVE
GLUCOSE, UA: NEGATIVE
KETONES UA: NEGATIVE
Leukocytes, UA: NEGATIVE
NITRITE UA: NEGATIVE
Protein, UA: NEGATIVE
RBC, UA: NEGATIVE
SPEC GRAV UA: 1.025 (ref 1.005–1.030)
UUROB: 0.2 mg/dL (ref 0.2–1.0)
pH, UA: 5.5 (ref 5.0–7.5)

## 2016-01-18 LAB — MICROSCOPIC EXAMINATION

## 2016-01-18 NOTE — Patient Instructions (Signed)
Asymptomatic Bacteriuria, Female Asymptomatic bacteriuria is the presence of a large number of bacteria in your urine without the usual symptoms of burning or frequent urination. The following conditions increase the risk of asymptomatic bacteriuria:  Diabetes mellitus.  Advanced age.  Pregnancy in the first trimester.  Kidney stones.  Kidney transplants.  Leaky kidney tube valve in young children (reflux). Treatment for this condition is not needed in most people and can lead to other problems such as too much yeast and growth of resistant bacteria. However, some people, such as pregnant women, do need treatment to prevent kidney infection. Asymptomatic bacteriuria in pregnancy is also associated with fetal growth restriction, premature labor, and newborn death. HOME CARE INSTRUCTIONS Monitor your condition for any changes. The following actions may help to relieve any discomfort you are feeling:  Drink enough water and fluids to keep your urine clear or pale yellow. Go to the bathroom more often to keep your bladder empty.  Keep the area around your vagina and rectum clean. Wipe yourself from front to back after urinating. SEEK IMMEDIATE MEDICAL CARE IF:  You develop signs of an infection such as:  Burning with urination.  Frequency of voiding.  Back pain.  Fever.  You have blood in the urine.  You develop a fever. MAKE SURE YOU:  Understand these instructions.  Will watch your condition.  Will get help right away if you are not doing well or get worse.   This information is not intended to replace advice given to you by your health care provider. Make sure you discuss any questions you have with your health care provider.   Document Released: 06/13/2005 Document Revised: 07/04/2014 Document Reviewed: 12/03/2012 Elsevier Interactive Patient Education 2016 Elsevier Inc.  

## 2016-01-18 NOTE — Progress Notes (Signed)
Subjective:    Patient ID: [name], female    DOB: 1924-09-27, 82 y.o.   MRN: [id number]  Pt presents to the office today for chronic follow up. Pt's daughter is with patient today and states she has been slightly confused over the few months and the last two weeks even worse. PT was seen on 01/06/16 for dysuria. Pt's urine was negative at that time. PT's daughter states she takes the cipro 250 mg every 72 hours. Pt's daughter states that week before being seen she took 4 tablets.   Asthma  She complains of hoarse voice and wheezing ("in the AM"). There is no difficulty breathing or frequent throat clearing. This is a chronic problem. The current episode started more than 1 year ago. The problem occurs intermittently. The problem has been waxing and waning. Associated symptoms include rhinorrhea. Pertinent negatives include no ear congestion, headaches, nasal congestion, sneezing or sore throat. Her symptoms are aggravated by any activity. Her symptoms are alleviated by leukotriene antagonist and rest. She reports moderate improvement on treatment. Her past medical history is significant for asthma.  Hypertension  This is a chronic problem. The current episode started more than 1 year ago. The problem has been resolved since onset. The problem is controlled. Associated symptoms include anxiety. Pertinent negatives include no headaches, palpitations or peripheral edema. Risk factors for coronary artery disease include post-menopausal state and sedentary lifestyle. Past treatments include ACE inhibitors and diuretics. The current treatment provides significant improvement. There is no history of kidney disease, CAD/MI, CVA, heart failure or a thyroid problem. There is no history of sleep apnea.  Anxiety  Presents for follow-up visit. Onset was more than 5 years ago. The problem has been waxing and waning. Symptoms include depressed mood and nervous/anxious behavior. Patient reports no excessive  worry, impotence, irritability, muscle tension, palpitations, panic or suicidal ideas. Symptoms occur rarely. The quality of sleep is good.   Her past medical history is significant for asthma and depression. Past treatments include SSRIs. The treatment provided significant relief. Compliance with prior treatments has been good.  Leg Pain   The incident occurred more than 1 week ago. The quality of the pain is described as aching. The pain is at a severity of 8/10. The pain is moderate. The pain has been constant since onset. Associated symptoms include numbness. Treatments tried: gabapentin and norco. The treatment provided moderate relief.  Back Pain  This is a chronic problem. The current episode started more than 1 year ago. The problem occurs intermittently. The problem has been waxing and waning since onset. The pain is present in the lumbar spine. The quality of the pain is described as aching. The pain is at a severity of 2/10. The pain is mild. The symptoms are aggravated by bending. Associated symptoms include leg pain and numbness. Pertinent negatives include no bladder incontinence, bowel incontinence, dysuria or headaches. She has tried bed rest for the symptoms. The treatment provided mild relief.  OAB PT states she was taking myrbetiriq 25 mg every day, but her insurance has stopped paying for it so she needs another medication today.    Review of Systems  Constitutional: Negative.  Negative for irritability.  HENT: Positive for hoarse voice and rhinorrhea. Negative for sneezing and sore throat.   Eyes: Negative.   Respiratory: Positive for wheezing ("in the AM").   Cardiovascular: Negative.  Negative for palpitations.  Gastrointestinal: Negative.  Negative for bowel incontinence.  Endocrine: Negative.   Genitourinary: Negative.  Negative for bladder incontinence, dysuria and impotence.  Musculoskeletal: Positive for back pain.  Neurological: Positive for numbness. Negative for  headaches.  Hematological: Negative.   Psychiatric/Behavioral: Negative for suicidal ideas. The patient is nervous/anxious.   All other systems reviewed and are negative.      Objective:   Physical Exam  Constitutional: She is oriented to person, place, and time. She appears well-developed and well-nourished. No distress.  HENT:  Head: Normocephalic and atraumatic.  Right Ear: External ear normal.  Left Ear: External ear normal.  Nose: Nose normal.  Mouth/Throat: Oropharynx is clear and moist.  Eyes: Pupils are equal, round, and reactive to light.  Neck: Normal range of motion. Neck supple. No thyromegaly present.  Cardiovascular: Normal rate and intact distal pulses.   Murmur heard. Irregular rhythm   Pulmonary/Chest: Effort normal. No respiratory distress. She has no wheezes.  Diminished breath sounds   Abdominal: Soft. Bowel sounds are normal. She exhibits no distension. There is no tenderness.  Musculoskeletal: Normal range of motion. She exhibits no edema or tenderness.  Mild lower extremity weakness   Neurological: She is alert and oriented to person, place, and time. She has normal reflexes. No cranial nerve deficit.  Skin: Skin is warm and dry.  Psychiatric: She has a normal mood and affect. Her behavior is normal. Judgment and thought content normal.  Vitals reviewed.   BP 137/67   Pulse (!) 58   Temp 97.7 F (36.5 C) (Oral)   Ht 5' 1.5" (1.562 m)   Wt 119 lb (54 kg)   BMI 22.12 kg/m        Assessment & Plan:  1. Asthma, mild persistent, uncomplicated - BBU03+JQDU - CBC with Differential/Platelet  2. Overactive bladder - CMP14+EGFR - CBC with Differential/Platelet  3. Other polyneuropathy ([place]) - CMP14+EGFR - CBC with Differential/Platelet  4. Chronic pain - CMP14+EGFR - CBC with Differential/Platelet  5. Depression - CMP14+EGFR - CBC with Differential/Platelet  6. Spinal stenosis, lumbar region, with neurogenic claudication - CMP14+EGFR -  CBC with Differential/Platelet  7. Altered mental status, unspecified altered mental status type - Urinalysis, Complete - CMP14+EGFR - CBC with Differential/Platelet   Continue all meds Labs pending Health Maintenance reviewed Diet and exercise encouraged RTO 6 months  [name], [name]

## 2016-01-26 ENCOUNTER — Telehealth: Payer: Self-pay | Admitting: Family

## 2016-01-26 ENCOUNTER — Telehealth: Payer: Self-pay | Admitting: Internal Medicine

## 2016-01-26 DIAGNOSIS — G8929 Other chronic pain: Secondary | ICD-10-CM

## 2016-01-26 MED ORDER — HYDROCODONE-ACETAMINOPHEN 5-325 MG PO TABS: 1.0000 | ORAL_TABLET | Freq: Two times a day (BID) | ORAL | 0 refills | Status: DC | PRN

## 2016-01-26 NOTE — Telephone Encounter (Signed)
Pt needs appt for pain contract. RX ready for pick up

## 2016-01-26 NOTE — Telephone Encounter (Signed)
Aware ,script ready. 

## 2016-01-26 NOTE — Telephone Encounter (Signed)
Called and spoke with pts daughter peggy.  She stated that the pts insurance will not cover pulmicort.  pts daughter calling to see if MW wanted to change this to the hand held inhaler.  MW please advise. Thanks  Allergies  Allergen Reactions  . Penicillins     unknown

## 2016-01-28 ENCOUNTER — Other Ambulatory Visit: Payer: Self-pay | Admitting: Family

## 2016-01-28 NOTE — Telephone Encounter (Signed)
Part B medicare does not have a "formulary" so I thing she must be submitting it under the wrong plan.  The easiest to use alternative is pulmocort  flexhaler 4 puffs bid but will probably need instruction to use it correctly so this will need to be provided by pharmacy/ nursing / one of our np's or me to assure she's using it correctly

## 2016-01-28 NOTE — Telephone Encounter (Signed)
Spoke with th pt's daughter, [name] and notified of recs per MW  She verbalized understanding and wants to try pulmicort flexhaler  She will take the pt to the pharm to instruct on use  Rx will be sent as soon as we find out what strength of med we need to call in  MW please advise if this should be 90 mcg or 180 mcg, thanks!

## 2016-01-29 MED ORDER — BUDESONIDE 180 MCG/ACT IN AEPB: 4.0000 | INHALATION_SPRAY | Freq: Two times a day (BID) | RESPIRATORY_TRACT | 5 refills | Status: DC

## 2016-01-29 NOTE — Telephone Encounter (Signed)
pulmicort 180 4 bid

## 2016-01-29 NOTE — Telephone Encounter (Signed)
Spoke with pt's daughter, Gigi Gin. She is aware of the strength of Pulmicort the pt should be on. Rx has been sent in. Nothing further was needed.

## 2016-02-05 ENCOUNTER — Telehealth: Payer: Self-pay | Admitting: Internal Medicine

## 2016-02-05 DIAGNOSIS — J4530 Mild persistent asthma, uncomplicated: Secondary | ICD-10-CM

## 2016-02-05 NOTE — Telephone Encounter (Signed)
Will forward to MW to document when he is done signing since I am not here next wk  Thanks

## 2016-02-05 NOTE — Telephone Encounter (Signed)
[facility] stating that Pulmicort Flexhaler is requiring PA Called [employer] @ [phone] and spoke with rep Marcelino DusterMichelle who walked me through printing the correct form off of the [employer] website.  Form filled out and placed in MW's lookat Will forward to [place] to assist with follow up  Pt ID [id number]

## 2016-02-08 NOTE — Telephone Encounter (Signed)
Forms have been signed and [facility].  Will keep forms in leslie's box to follow up on.

## 2016-02-09 MED ORDER — MOMETASONE FUROATE 220 MCG/INH IN AEPB: 2.0000 | INHALATION_SPRAY | Freq: Every day | RESPIRATORY_TRACT | 12 refills | Status: DC

## 2016-02-09 NOTE — Telephone Encounter (Signed)
[facility] from [facility].  CB [phone].  Pulmicort inhaler has been denied coverage.  Two alternatives available on patient's formulary plan, Flovent and Asmanex.  Letter will be sent as well.

## 2016-02-09 NOTE — Telephone Encounter (Signed)
[facility] from [facility].  CB [phone].  Pulmicort inhaler has been denied coverage.  Two alternatives available on patient's formulary plan, Flovent and Asmanex.  Letter will be sent as well.  Dr. Sherene SiresWert, please advise.    No need to change your medications for now - call if any problem refilling nebs / solutions  Your blood pressure pill may cause throat congestion/ cough and if develops need to let your primary provider select a sustitute

## 2016-02-09 NOTE — Telephone Encounter (Signed)
Rx for Asmanex sent to pharmacy. Patient's daughter, Gigi Gineggy notified. Nothing further needed.

## 2016-02-09 NOTE — Telephone Encounter (Signed)
asmanex 220 2 puff daily

## 2016-02-15 ENCOUNTER — Ambulatory Visit (INDEPENDENT_AMBULATORY_CARE_PROVIDER_SITE_OTHER): Payer: Medicare Other | Admitting: Family

## 2016-02-15 ENCOUNTER — Encounter: Payer: Self-pay | Admitting: Family

## 2016-02-15 VITALS — BP 108/60 | HR 58 | Temp 97.6°F | Ht 61.5 in | Wt 119.0 lb

## 2016-02-15 DIAGNOSIS — G6289 Other specified polyneuropathies: Secondary | ICD-10-CM | POA: Diagnosis not present

## 2016-02-15 DIAGNOSIS — M4806 Spinal stenosis, lumbar region: Secondary | ICD-10-CM | POA: Diagnosis not present

## 2016-02-15 DIAGNOSIS — F1120 Opioid dependence, uncomplicated: Secondary | ICD-10-CM | POA: Diagnosis not present

## 2016-02-15 DIAGNOSIS — Z79899 Other long term (current) drug therapy: Secondary | ICD-10-CM

## 2016-02-15 DIAGNOSIS — M48062 Spinal stenosis, lumbar region with neurogenic claudication: Secondary | ICD-10-CM

## 2016-02-15 DIAGNOSIS — G8929 Other chronic pain: Secondary | ICD-10-CM | POA: Diagnosis not present

## 2016-02-15 DIAGNOSIS — Z0289 Encounter for other administrative examinations: Secondary | ICD-10-CM | POA: Insufficient documentation

## 2016-02-15 MED ORDER — HYDROCODONE-ACETAMINOPHEN 5-325 MG PO TABS: 1.0000 | ORAL_TABLET | Freq: Two times a day (BID) | ORAL | 0 refills | Status: DC | PRN

## 2016-02-15 MED ORDER — HYDROCODONE-ACETAMINOPHEN 5-325 MG PO TABS: 1.0000 | ORAL_TABLET | Freq: Four times a day (QID) | ORAL | 0 refills | Status: DC | PRN

## 2016-02-15 MED ORDER — SERTRALINE HCL 50 MG PO TABS: 50.0000 mg | ORAL_TABLET | Freq: Every day | ORAL | 2 refills | Status: DC

## 2016-02-15 MED ORDER — SOLIFENACIN SUCCINATE 10 MG PO TABS
10.0000 mg | ORAL_TABLET | Freq: Every day | ORAL | 2 refills | Status: DC
Start: 2016-02-15 — End: 2016-05-17

## 2016-02-15 MED ORDER — LORAZEPAM 0.5 MG PO TABS: 0.2500 mg | ORAL_TABLET | Freq: Two times a day (BID) | ORAL | 0 refills | Status: DC | PRN

## 2016-02-15 MED ORDER — LISINOPRIL-HYDROCHLOROTHIAZIDE 20-12.5 MG PO TABS: 1.0000 | ORAL_TABLET | Freq: Every day | ORAL | 2 refills | Status: DC

## 2016-02-15 NOTE — Patient Instructions (Signed)

## 2016-02-15 NOTE — Progress Notes (Signed)
[place] Controlled Substance Abuse database reviewed- Yes If yes- were their any concerning findings : None, called Pharmacy and they have her DOB at 03/26/1925. Using that no other provider as given her pain medication except me.  Depression screen [facility] 2/9 02/15/2016 01/18/2016 01/06/2016 12/05/2015 07/02/2015  Decreased Interest 0 0 0 0 0  Down, Depressed, Hopeless 0 0 0 0 0  PHQ - 2 Score 0 0 0 0 0    No flowsheet data found.     [place] drug screen performed- Yes  SOAPP  0= never  1= seldom  2=sometimes  3= often  4= very often  How often do you have mood swings? 1 How often do you smoke a cigarette within an hour after waling up? 0 How often have you taken medication other than the way that it was prescribed?0 How often have you used illegal drugs in the past 5 years? 0 How often, in your lifetime, have you had legal problems or been arrested? 0  Score 1  Alcohol Audit - How often during the last year have found that you: 0-Never   1- Less than monthly   2- Monthly     3-Weekly     4-daily or almost daily  - found that you were not able to stop drinking once you started- 0 -failed to do what was normally expected of you because of drinking- 0 -needed a first drink in the morning- 0 -had a feeling of guilt or remorse after drinking- 0 -are/were unable to remember what happened the night before because of your drinking- 0  0- NO   2- yes but not in last year  4- yes during last year -Have you or someone else been injured because of your drinking- 0 - Has anyone been concerned about your drinking or suggested you cut down- 0        TOTAL- 0   ( 0-7- alcohol education, 8-15- simple advice, 16-19 simple advice plus counseling, 20-40 referral for evaluation and treatment 0   [facility]  Pain assessment: Cause of pain- Neuropathy leg pain and chronic lower back pain Pain location- bilateral legs and lower back Pain on scale of 1-10-  7 Frequency- constant What increases pain-walking and standing What makes pain Better- Rest, heating pad, and pain meds  Pain management agreement reviewed and signed- Yes

## 2016-02-15 NOTE — Addendum Note (Signed)
Addended by: Jannifer RodneyHAWKS, Astha Probasco A on: 02/15/2016 12:46 PM   Modules accepted: Orders

## 2016-02-23 ENCOUNTER — Ambulatory Visit: Payer: Medicare Other | Admitting: Family

## 2016-02-23 LAB — TOXASSURE SELECT 13 (MW), URINE: PDF: 0

## 2016-02-25 ENCOUNTER — Ambulatory Visit (INDEPENDENT_AMBULATORY_CARE_PROVIDER_SITE_OTHER): Payer: Medicare Other | Admitting: Family Medicine

## 2016-02-25 DIAGNOSIS — G894 Chronic pain syndrome: Secondary | ICD-10-CM | POA: Diagnosis not present

## 2016-02-25 DIAGNOSIS — M4806 Spinal stenosis, lumbar region: Secondary | ICD-10-CM

## 2016-02-25 DIAGNOSIS — R296 Repeated falls: Secondary | ICD-10-CM | POA: Diagnosis not present

## 2016-02-25 DIAGNOSIS — I10 Essential (primary) hypertension: Secondary | ICD-10-CM | POA: Diagnosis not present

## 2016-03-03 ENCOUNTER — Other Ambulatory Visit: Payer: Self-pay | Admitting: Family

## 2016-03-07 ENCOUNTER — Ambulatory Visit (INDEPENDENT_AMBULATORY_CARE_PROVIDER_SITE_OTHER): Payer: Medicare Other | Admitting: Family

## 2016-03-07 ENCOUNTER — Encounter: Payer: Self-pay | Admitting: Family

## 2016-03-07 VITALS — BP 136/68 | HR 59 | Temp 97.9°F | Ht 60.0 in | Wt 119.0 lb

## 2016-03-07 DIAGNOSIS — L03012 Cellulitis of left finger: Secondary | ICD-10-CM

## 2016-03-07 MED ORDER — CLINDAMYCIN HCL 300 MG PO CAPS: 300.0000 mg | ORAL_CAPSULE | Freq: Four times a day (QID) | ORAL | 0 refills | Status: DC

## 2016-03-07 NOTE — Progress Notes (Signed)
   Subjective:    Patient ID: [name], female    DOB: Mar 05, 1925, 82 y.o.   MRN: [id number]  HPI PT presents to the office today with left thumb swelling and pain. Pt states last Wednesday she noticed is was swelling erythemas. Pt states she has constant pain of 8 out 10. Pt has tried soaking in salt water. Pt has had yellow green drainage out of her thumb.   Review of Systems  Skin: Positive for wound.  All other systems reviewed and are negative.      Objective:   Physical Exam  Constitutional: She is oriented to person, place, and time. She appears well-developed and well-nourished. No distress.  HENT:  Head: Normocephalic.  Cardiovascular: Normal rate, regular rhythm, normal heart sounds and intact distal pulses.   No murmur heard. Pulmonary/Chest: Effort normal and breath sounds normal. No respiratory distress. She has no wheezes.  Abdominal: Soft. Bowel sounds are normal. She exhibits no distension. There is no tenderness.  Musculoskeletal: Normal range of motion. She exhibits no edema or tenderness.  Neurological: She is alert and oriented to person, place, and time.  Skin: Skin is warm and dry. Lesion noted. There is erythema.  Left medial thumb with erythemas and drainage present. Epidermis peeling off with Dermis skin present. Blood flow present. No necrosis present at this time.     Psychiatric: She has a normal mood and affect. Her behavior is normal. Judgment and thought content normal.  Vitals reviewed.   BP 136/68   Pulse (!) 59   Temp 97.9 F (36.6 C) (Oral)   Ht 5' (1.524 m)   Wt 119 lb (54 kg)   BMI 23.24 kg/m       Assessment & Plan:  1. Cellulitis of finger of left hand Do not pick or squeeze  -Soak in warm water Keep clean and dry -RTO in 3 days to recheck  Call office if skin becomes black or redness and swelling worsen. (area marked) - clindamycin (CLEOCIN) 300 MG capsule; Take 1 capsule (300 mg total) by mouth 4 (four) times daily.   Dispense: 28 capsule; Refill: 0  Jannifer Rodneyhristy Jarelle Ates, [name]

## 2016-03-07 NOTE — Patient Instructions (Signed)

## 2016-03-10 ENCOUNTER — Ambulatory Visit (INDEPENDENT_AMBULATORY_CARE_PROVIDER_SITE_OTHER): Payer: Medicare Other | Admitting: Family

## 2016-03-10 ENCOUNTER — Encounter: Payer: Self-pay | Admitting: Family

## 2016-03-10 ENCOUNTER — Telehealth: Payer: Self-pay | Admitting: Family

## 2016-03-10 VITALS — BP 123/59 | HR 75 | Temp 97.8°F

## 2016-03-10 DIAGNOSIS — G6289 Other specified polyneuropathies: Secondary | ICD-10-CM

## 2016-03-10 DIAGNOSIS — Z5189 Encounter for other specified aftercare: Secondary | ICD-10-CM

## 2016-03-10 DIAGNOSIS — L03012 Cellulitis of left finger: Secondary | ICD-10-CM | POA: Diagnosis not present

## 2016-03-10 DIAGNOSIS — M48062 Spinal stenosis, lumbar region with neurogenic claudication: Secondary | ICD-10-CM

## 2016-03-10 NOTE — Patient Instructions (Signed)

## 2016-03-10 NOTE — Progress Notes (Signed)
   Subjective:    Patient ID: Alexandra ShepherdVesta T Arciniega, female    DOB: 1924-12-21, 82 y.o.   MRN: [id number]  HPI Pt presents to the office today to recheck left thumb cellulities. Pt was seen in the office 03/07/16 and was started on Clindamycin    Review of Systems  Skin: Positive for wound.       Objective:   Physical Exam  Cardiovascular: Normal rate and regular rhythm.   Murmur heard. Pulmonary/Chest: Effort normal.  Diminished breath sounds   Skin: Skin is warm.  Left thumb with erythemas, excoriation. Erythemas and swelling greatly improved from last visit. No discharge present.     BP (!) 123/59 (BP Location: Left Arm, Patient Position: Sitting, Cuff Size: Normal)   Pulse 75   Temp 97.8 F (36.6 C) (Oral)        Assessment & Plan:  1. Cellulitis of finger of left hand  2. Encounter for wound re-check  Continue Clindamycin  Do not pick or squeeze Soak as needed RTO after antibiotics if wound is not healed or if erythemas, swelling worsens  Jannifer Rodneyhristy Berdie Malter, [name]

## 2016-03-14 NOTE — Telephone Encounter (Signed)
RX ready for pick up 

## 2016-03-22 ENCOUNTER — Telehealth: Payer: Self-pay | Admitting: Family

## 2016-03-22 NOTE — Telephone Encounter (Signed)
Pt has rash on back, does not itch. Will continue to watch and will call to make appt if sxs persist or worsen.

## 2016-03-23 ENCOUNTER — Ambulatory Visit (INDEPENDENT_AMBULATORY_CARE_PROVIDER_SITE_OTHER): Payer: Medicare Other | Admitting: Family

## 2016-03-23 ENCOUNTER — Encounter: Payer: Self-pay | Admitting: Family

## 2016-03-23 VITALS — BP 110/53 | HR 73 | Temp 97.8°F

## 2016-03-23 DIAGNOSIS — L500 Allergic urticaria: Secondary | ICD-10-CM | POA: Diagnosis not present

## 2016-03-23 MED ORDER — PREDNISONE 10 MG (21) PO TBPK: ORAL_TABLET | ORAL | 0 refills | Status: DC

## 2016-03-23 MED ORDER — DIPHENHYDRAMINE HCL 25 MG PO TABS: 25.0000 mg | ORAL_TABLET | Freq: Four times a day (QID) | ORAL | 0 refills | Status: DC | PRN

## 2016-03-23 NOTE — Progress Notes (Signed)
   Subjective:    Patient ID: [name], female    DOB: June 29, 1924, 82 y.o.   MRN: [id number]  Rash  This is a new problem. The current episode started in the past 7 days. The affected locations include the chest, back, groin, genitalia, left lower leg, left upper leg, right lower leg and right upper leg. The rash is characterized by redness. It is unknown if there was an exposure to a precipitant. Pertinent negatives include no congestion, facial edema, fatigue, fever, shortness of breath or sore throat. Past treatments include anti-itch cream. The treatment provided mild relief.      Review of Systems  Constitutional: Negative for fatigue and fever.  HENT: Negative for congestion and sore throat.   Respiratory: Negative for shortness of breath.   Skin: Positive for rash.  All other systems reviewed and are negative.      Objective:   Physical Exam  Constitutional: She is oriented to person, place, and time. She appears well-developed and well-nourished. No distress.  HENT:  Head: Normocephalic.  Cardiovascular: Normal rate, regular rhythm, normal heart sounds and intact distal pulses.   No murmur heard. Pulmonary/Chest: Effort normal and breath sounds normal. No respiratory distress. She has no wheezes.  Musculoskeletal: Normal range of motion. She exhibits no edema or tenderness.  Neurological: She is alert and oriented to person, place, and time.  Skin: Skin is warm and dry. Rash noted.  Generalized hives on back, abdomen, chest, groin, legs, and neck  Psychiatric: She has a normal mood and affect. Her behavior is normal. Judgment and thought content normal.  Vitals reviewed.  BP (!) 110/53   Pulse 73   Temp 97.8 F (36.6 C)       Assessment & Plan:  1. Allergic urticaria -Avoid scratching -Take benadryl as needed for itching -Discussed with family if anything changed and they are unsure, daughter who washes clothes is out of town. If they have switched laundry  detergent switch back ect.  RTO prn  - predniSONE (STERAPRED UNI-PAK 21 TAB) 10 MG (21) TBPK tablet; Use as directed  Dispense: 21 tablet; Refill: 0 - diphenhydrAMINE (BENADRYL) 25 MG tablet; Take 1 tablet (25 mg total) by mouth every 6 (six) hours as needed.  Dispense: 30 tablet; Refill: 0  Jannifer Rodneyhristy Berdena Cisek, [name]

## 2016-03-23 NOTE — Patient Instructions (Signed)
Hives Hives are itchy, red, swollen areas of the skin. They can vary in size and location on your body. Hives can come and go for hours or several days (acute hives) or for several weeks (chronic hives). Hives do not spread from person to person (noncontagious). They may get worse with scratching, exercise, and emotional stress. CAUSES   Allergic reaction to food, additives, or drugs.  Infections, including the common cold.  Illness, such as vasculitis, lupus, or thyroid disease.  Exposure to sunlight, heat, or cold.  Exercise.  Stress.  Contact with chemicals. SYMPTOMS   Red or white swollen patches on the skin. The patches may change size, shape, and location quickly and repeatedly.  Itching.  Swelling of the hands, feet, and face. This may occur if hives develop deeper in the skin. DIAGNOSIS  Your caregiver can usually tell what is wrong by performing a physical exam. Skin or blood tests may also be done to determine the cause of your hives. In some cases, the cause cannot be determined. TREATMENT  Mild cases usually get better with medicines such as antihistamines. Severe cases may require an emergency epinephrine injection. If the cause of your hives is known, treatment includes avoiding that trigger.  HOME CARE INSTRUCTIONS   Avoid causes that trigger your hives.  Take antihistamines as directed by your caregiver to reduce the severity of your hives. Non-sedating or low-sedating antihistamines are usually recommended. Do not drive while taking an antihistamine.  Take any other medicines prescribed for itching as directed by your caregiver.  Wear loose-fitting clothing.  Keep all follow-up appointments as directed by your caregiver. SEEK MEDICAL CARE IF:   You have persistent or severe itching that is not relieved with medicine.  You have painful or swollen joints. SEEK IMMEDIATE MEDICAL CARE IF:   You have a fever.  Your tongue or lips are swollen.  You have  trouble breathing or swallowing.  You feel tightness in the throat or chest.  You have abdominal pain. These problems may be the first sign of a life-threatening allergic reaction. Call your local emergency services (911 in [place] MAKE SURE YOU:   Understand these instructions.  Will watch your condition.  Will get help right away if you are not doing well or get worse.   This information is not intended to replace advice given to you by your health care provider. Make sure you discuss any questions you have with your health care provider.   Document Released: 06/13/2005 Document Revised: 06/18/2013 Document Reviewed: 09/06/2011 Elsevier Interactive Patient Education 2016 Elsevier Inc.  

## 2016-03-31 ENCOUNTER — Other Ambulatory Visit: Payer: Self-pay | Admitting: Family

## 2016-04-19 ENCOUNTER — Ambulatory Visit (INDEPENDENT_AMBULATORY_CARE_PROVIDER_SITE_OTHER): Payer: Medicare Other

## 2016-04-19 DIAGNOSIS — Z23 Encounter for immunization: Secondary | ICD-10-CM | POA: Diagnosis not present

## 2016-04-26 ENCOUNTER — Telehealth: Payer: Self-pay | Admitting: Internal Medicine

## 2016-04-26 ENCOUNTER — Ambulatory Visit (INDEPENDENT_AMBULATORY_CARE_PROVIDER_SITE_OTHER): Payer: Medicare Other | Admitting: Family Medicine

## 2016-04-26 DIAGNOSIS — R296 Repeated falls: Secondary | ICD-10-CM | POA: Diagnosis not present

## 2016-04-26 DIAGNOSIS — G894 Chronic pain syndrome: Secondary | ICD-10-CM

## 2016-04-26 DIAGNOSIS — I10 Essential (primary) hypertension: Secondary | ICD-10-CM

## 2016-04-26 DIAGNOSIS — M48061 Spinal stenosis, lumbar region without neurogenic claudication: Secondary | ICD-10-CM

## 2016-04-26 NOTE — Telephone Encounter (Signed)
Spoke with pt's daughter Gigi Gineggy (dpr on file), states that pt is having too much difficulty using Asmanex inhaler, and she will get very frustrated with this and just doesn't use it.  Pt is using pulmicort instead, but pt's daughter states that "pt only uses one medicine in nebulizer and MW says she can only use it when there are 2 meds in the nebulizer".   Pt's daughter is requesting a simpler inhaler for pt.  Pt uses [facility].    MW please advise.  Thanks!   Instructions  No need to change your medications for now - call if any problem refilling nebs / solutions   Your blood pressure pill may cause throat congestion/ cough and if develops need to let your primary provider select a sustitute

## 2016-04-27 NOTE — Telephone Encounter (Signed)
[name] returned call, CB is [phone]

## 2016-04-27 NOTE — Telephone Encounter (Signed)
Spoke with pt's daughter, Gigi Gineggy. She is aware of MW's response. [name] declined making an appointment at this time. States she will call back if pt wants an appointment.

## 2016-04-27 NOTE — Telephone Encounter (Signed)
lmtcb x1 for [employer]eggy.

## 2016-04-27 NOTE — Telephone Encounter (Signed)
She is confused with the instructions and no way to reconcile what she really needs over the phone so needs ov with all meds /solutions in hand if wants my input - ok also to see Tammy if no openings

## 2016-05-17 ENCOUNTER — Encounter: Payer: Self-pay | Admitting: Family

## 2016-05-17 ENCOUNTER — Ambulatory Visit (INDEPENDENT_AMBULATORY_CARE_PROVIDER_SITE_OTHER): Payer: Medicare Other | Admitting: Family

## 2016-05-17 VITALS — BP 122/60 | HR 62 | Temp 97.9°F | Ht 60.0 in | Wt 113.8 lb

## 2016-05-17 DIAGNOSIS — J45909 Unspecified asthma, uncomplicated: Secondary | ICD-10-CM

## 2016-05-17 DIAGNOSIS — M48062 Spinal stenosis, lumbar region with neurogenic claudication: Secondary | ICD-10-CM | POA: Diagnosis not present

## 2016-05-17 DIAGNOSIS — G6289 Other specified polyneuropathies: Secondary | ICD-10-CM | POA: Diagnosis not present

## 2016-05-17 DIAGNOSIS — F411 Generalized anxiety disorder: Secondary | ICD-10-CM

## 2016-05-17 DIAGNOSIS — Z0289 Encounter for other administrative examinations: Secondary | ICD-10-CM | POA: Diagnosis not present

## 2016-05-17 DIAGNOSIS — G894 Chronic pain syndrome: Secondary | ICD-10-CM | POA: Diagnosis not present

## 2016-05-17 DIAGNOSIS — N3281 Overactive bladder: Secondary | ICD-10-CM | POA: Diagnosis not present

## 2016-05-17 DIAGNOSIS — F339 Major depressive disorder, recurrent, unspecified: Secondary | ICD-10-CM

## 2016-05-17 DIAGNOSIS — F1120 Opioid dependence, uncomplicated: Secondary | ICD-10-CM

## 2016-05-17 MED ORDER — LORAZEPAM 0.5 MG PO TABS: 0.2500 mg | ORAL_TABLET | Freq: Two times a day (BID) | ORAL | 1 refills | Status: DC | PRN

## 2016-05-17 MED ORDER — HYDROCODONE-ACETAMINOPHEN 5-325 MG PO TABS: 1.0000 | ORAL_TABLET | Freq: Four times a day (QID) | ORAL | 0 refills | Status: DC | PRN

## 2016-05-17 MED ORDER — HYDROCODONE-ACETAMINOPHEN 5-325 MG PO TABS: 1.0000 | ORAL_TABLET | Freq: Two times a day (BID) | ORAL | 0 refills | Status: DC | PRN

## 2016-05-17 MED ORDER — GABAPENTIN 800 MG PO TABS: ORAL_TABLET | ORAL | 2 refills | Status: DC

## 2016-05-17 MED ORDER — MOMETASONE FUROATE 200 MCG/ACT IN AERO: 2.0000 | INHALATION_SPRAY | Freq: Every day | RESPIRATORY_TRACT | 11 refills | Status: DC

## 2016-05-17 MED ORDER — SERTRALINE HCL 50 MG PO TABS: 50.0000 mg | ORAL_TABLET | Freq: Every day | ORAL | 2 refills | Status: DC

## 2016-05-17 MED ORDER — SOLIFENACIN SUCCINATE 10 MG PO TABS: 10.0000 mg | ORAL_TABLET | Freq: Every day | ORAL | 2 refills | Status: DC

## 2016-05-17 MED ORDER — MONTELUKAST SODIUM 10 MG PO TABS: 10.0000 mg | ORAL_TABLET | Freq: Every day | ORAL | 3 refills | Status: DC

## 2016-05-17 NOTE — Progress Notes (Signed)
Subjective:    Patient ID: [name], female    DOB: May 30, 1925, 82 y.o.   MRN: [id number]  Pt presents to the office today for chronic follow up.   Asthma  She complains of hoarse voice, shortness of breath ("once in awhile") and wheezing ("in the AM"). There is no difficulty breathing or frequent throat clearing. This is a chronic problem. The current episode started more than 1 year ago. The problem occurs intermittently. The problem has been waxing and waning. Associated symptoms include rhinorrhea. Pertinent negatives include no ear congestion, headaches, nasal congestion, sneezing or sore throat. Her symptoms are aggravated by any activity. Her symptoms are alleviated by leukotriene antagonist and rest. She reports moderate improvement on treatment. Her past medical history is significant for asthma.  Hypertension  This is a chronic problem. The current episode started more than 1 year ago. The problem has been resolved since onset. The problem is controlled. Associated symptoms include anxiety and shortness of breath ("once in awhile"). Pertinent negatives include no headaches, palpitations or peripheral edema. Risk factors for coronary artery disease include post-menopausal state and sedentary lifestyle. Past treatments include ACE inhibitors and diuretics. The current treatment provides significant improvement. There is no history of kidney disease, CAD/MI, CVA, heart failure or a thyroid problem. There is no history of sleep apnea.  Anxiety  Presents for follow-up visit. Onset was more than 5 years ago. The problem has been waxing and waning. Symptoms include depressed mood, nervous/anxious behavior and shortness of breath ("once in awhile"). Patient reports no excessive worry, impotence, irritability, muscle tension, palpitations, panic or suicidal ideas. Symptoms occur rarely. The quality of sleep is good.   Her past medical history is significant for asthma and depression. Past  treatments include SSRIs. The treatment provided significant relief. Compliance with prior treatments has been good.  Leg Pain   The incident occurred more than 1 week ago. The quality of the pain is described as aching. The pain is at a severity of 7/10. The pain is moderate. The pain has been constant since onset. Associated symptoms include numbness. Treatments tried: gabapentin and norco. The treatment provided moderate relief.  Back Pain  This is a chronic problem. The current episode started more than 1 year ago. The problem occurs intermittently. The problem has been waxing and waning since onset. The pain is present in the lumbar spine. The quality of the pain is described as aching. The pain is at a severity of 6/10. The pain is mild. The symptoms are aggravated by bending. Associated symptoms include leg pain and numbness. Pertinent negatives include no bladder incontinence, bowel incontinence, dysuria or headaches. She has tried bed rest for the symptoms. The treatment provided mild relief.  [facility] PT states she was taking vesicare 10 mg daily. Stable at this time.   Pain assessment: Cause of pain-  Pain location- Neuropathy and chronic back pain Pain on scale of 1-10- 7 Frequency- Constantly What increases pain-Moving What makes pain Better-rest and pain medication  Current medications- Norco 5-325 mg every 6 hours as needed, #60 a month Effectiveness of current meds-Stable   Urine drug screen- No, 02/15/16 Was the [place] reviewed- Yes  If yes were their any concerning findings? None, only received controlled medications from me.   Review of Systems  Constitutional: Negative.  Negative for irritability.  HENT: Positive for hoarse voice and rhinorrhea. Negative for sneezing and sore throat.   Eyes: Negative.   Respiratory: Positive for shortness of breath ("once in  awhile") and wheezing ("in the AM").   Cardiovascular: Negative.  Negative for palpitations.  Gastrointestinal:  Negative.  Negative for bowel incontinence.  Endocrine: Negative.   Genitourinary: Negative.  Negative for bladder incontinence, dysuria and impotence.  Musculoskeletal: Positive for back pain.  Neurological: Positive for numbness. Negative for headaches.  Hematological: Negative.   Psychiatric/Behavioral: Negative for suicidal ideas. The patient is nervous/anxious.   All other systems reviewed and are negative.      Objective:   Physical Exam  Constitutional: She is oriented to person, place, and time. She appears well-developed and well-nourished. No distress.  HENT:  Head: Normocephalic and atraumatic.  Right Ear: External ear normal.  Left Ear: External ear normal.  Nose: Nose normal.  Mouth/Throat: Oropharynx is clear and moist.  Eyes: Pupils are equal, round, and reactive to light.  Neck: Normal range of motion. Neck supple. No thyromegaly present.  Cardiovascular: Normal rate and intact distal pulses.   Murmur heard. Irregular rhythm   Pulmonary/Chest: Effort normal. No respiratory distress. She has decreased breath sounds. She has no wheezes.  Diminished breath sounds   Abdominal: Soft. Bowel sounds are normal. She exhibits no distension. There is no tenderness.  Musculoskeletal: Normal range of motion. She exhibits no edema or tenderness.  Mild lower extremity weakness, pt in wheelchair    Neurological: She is alert and oriented to person, place, and time. She has normal reflexes. No cranial nerve deficit.  Skin: Skin is warm and dry.  Psychiatric: She has a normal mood and affect. Her behavior is normal. Judgment and thought content normal.  Vitals reviewed.   BP 122/60   Pulse 62   Temp 97.9 F (36.6 C) (Oral)   Ht 5' (1.524 m)   Wt 113 lb 12.8 oz (51.6 kg)   BMI 22.23 kg/m      Assessment & Plan:  1. Uncomplicated asthma, unspecified asthma severity, unspecified whether persistent - CMP14+EGFR  2. Other polyneuropathy ([place]) - HYDROcodone-acetaminophen  (NORCO/VICODIN) 5-325 MG tablet; Take 1 tablet by mouth 2 (two) times daily as needed for moderate pain.  Dispense: 60 tablet; Refill: 0 - HYDROcodone-acetaminophen (NORCO/VICODIN) 5-325 MG tablet; Take 1 tablet by mouth every 6 (six) hours as needed for moderate pain.  Dispense: 60 tablet; Refill: 0 - HYDROcodone-acetaminophen (NORCO/VICODIN) 5-325 MG tablet; Take 1 tablet by mouth every 6 (six) hours as needed for moderate pain.  Dispense: 60 tablet; Refill: 0 - CMP14+EGFR  3. Overactive bladder - CMP14+EGFR  4. Chronic pain syndrome - HYDROcodone-acetaminophen (NORCO/VICODIN) 5-325 MG tablet; Take 1 tablet by mouth 2 (two) times daily as needed for moderate pain.  Dispense: 60 tablet; Refill: 0 - HYDROcodone-acetaminophen (NORCO/VICODIN) 5-325 MG tablet; Take 1 tablet by mouth every 6 (six) hours as needed for moderate pain.  Dispense: 60 tablet; Refill: 0 - HYDROcodone-acetaminophen (NORCO/VICODIN) 5-325 MG tablet; Take 1 tablet by mouth every 6 (six) hours as needed for moderate pain.  Dispense: 60 tablet; Refill: 0 - CMP14+EGFR  5. Pain medication agreement signed - HYDROcodone-acetaminophen (NORCO/VICODIN) 5-325 MG tablet; Take 1 tablet by mouth 2 (two) times daily as needed for moderate pain.  Dispense: 60 tablet; Refill: 0 - HYDROcodone-acetaminophen (NORCO/VICODIN) 5-325 MG tablet; Take 1 tablet by mouth every 6 (six) hours as needed for moderate pain.  Dispense: 60 tablet; Refill: 0 - HYDROcodone-acetaminophen (NORCO/VICODIN) 5-325 MG tablet; Take 1 tablet by mouth every 6 (six) hours as needed for moderate pain.  Dispense: 60 tablet; Refill: 0 - CMP14+EGFR  6. Uncomplicated opioid dependence ([place]) -  HYDROcodone-acetaminophen (NORCO/VICODIN) 5-325 MG tablet; Take 1 tablet by mouth 2 (two) times daily as needed for moderate pain.  Dispense: 60 tablet; Refill: 0 - HYDROcodone-acetaminophen (NORCO/VICODIN) 5-325 MG tablet; Take 1 tablet by mouth every 6 (six) hours as needed for  moderate pain.  Dispense: 60 tablet; Refill: 0 - HYDROcodone-acetaminophen (NORCO/VICODIN) 5-325 MG tablet; Take 1 tablet by mouth every 6 (six) hours as needed for moderate pain.  Dispense: 60 tablet; Refill: 0 - CMP14+EGFR  7. Episode of recurrent major depressive disorder, unspecified depression episode severity ([place]) - CMP14+EGFR  8. Spinal stenosis, lumbar region, with neurogenic claudication - HYDROcodone-acetaminophen (NORCO/VICODIN) 5-325 MG tablet; Take 1 tablet by mouth 2 (two) times daily as needed for moderate pain.  Dispense: 60 tablet; Refill: 0 - HYDROcodone-acetaminophen (NORCO/VICODIN) 5-325 MG tablet; Take 1 tablet by mouth every 6 (six) hours as needed for moderate pain.  Dispense: 60 tablet; Refill: 0 - HYDROcodone-acetaminophen (NORCO/VICODIN) 5-325 MG tablet; Take 1 tablet by mouth every 6 (six) hours as needed for moderate pain.  Dispense: 60 tablet; Refill: 0 - CMP14+EGFR  9. GAD (generalized anxiety disorder) - CMP14+EGFR - LORazepam (ATIVAN) 0.5 MG tablet; Take 0.5 tablets (0.25 mg total) by mouth every 12 (twelve) hours as needed for anxiety.  Dispense: 30 tablet; Refill: 1   Continue all meds Labs pending Health Maintenance reviewed Diet and exercise encouraged RTO 3 months   [name], [name]

## 2016-05-17 NOTE — Patient Instructions (Signed)
Peripheral Neuropathy Peripheral neuropathy is a type of nerve damage. It affects nerves that carry signals between the spinal cord and other parts of the body. These are called peripheral nerves. With peripheral neuropathy, one nerve or a group of nerves may be damaged. What are the causes? Many things can damage peripheral nerves. For some people with peripheral neuropathy, the cause is unknown. Some causes include:  Diabetes. This is the most common cause of peripheral neuropathy.  Injury to a nerve.  Pressure or stress on a nerve that lasts a long time.  Too little vitamin B. Alcoholism can lead to this.  Infections.  Autoimmune diseases, such as multiple sclerosis and systemic lupus erythematosus.  Inherited nerve diseases.  Some medicines, such as cancer drugs.  Toxic substances, such as lead and mercury.  Too little blood flowing to the legs.  Kidney disease.  Thyroid disease.  What are the signs or symptoms? Different people have different symptoms. The symptoms you have will depend on which of your nerves is damaged. Common symptoms include:  Loss of feeling (numbness) in the feet and hands.  Tingling in the feet and hands.  Pain that burns.  Very sensitive skin.  Weakness.  Not being able to move a part of the body (paralysis).  Muscle twitching.  Clumsiness or poor coordination.  Loss of balance.  Not being able to control your bladder.  Feeling dizzy.  Sexual problems.  How is this diagnosed? Peripheral neuropathy is a symptom, not a disease. Finding the cause of peripheral neuropathy can be hard. To figure that out, your health care provider will take a medical history and do a physical exam. A neurological exam will also be done. This involves checking things affected by your brain, spinal cord, and nerves (nervous system). For example, your health care provider will check your reflexes, how you move, and what you can feel. Other types of tests  may also be ordered, such as:  Blood tests.  A test of the fluid in your spinal cord.  Imaging tests, such as CT scans or an MRI.  Electromyography (EMG). This test checks the nerves that control muscles.  Nerve conduction velocity tests. These tests check how fast messages pass through your nerves.  Nerve biopsy. A small piece of nerve is removed. It is then checked under a microscope.  How is this treated?  Medicine is often used to treat peripheral neuropathy. Medicines may include: ? Pain-relieving medicines. Prescription or over-the-counter medicine may be suggested. ? Antiseizure medicine. This may be used for pain. ? Antidepressants. These also may help ease pain from neuropathy. ? Lidocaine. This is a numbing medicine. You might wear a patch or be given a shot. ? Mexiletine. This medicine is typically used to help control irregular heart rhythms.  Surgery. Surgery may be needed to relieve pressure on a nerve or to destroy a nerve that is causing pain.  Physical therapy to help movement.  Assistive devices to help movement. Follow these instructions at home:  Only take over-the-counter or prescription medicines as directed by your health care provider. Follow the instructions carefully for any given medicines. Do not take any other medicines without first getting approval from your health care provider.  If you have diabetes, work closely with your health care provider to keep your blood sugar under control.  If you have numbness in your feet: ? Check every day for signs of injury or infection. Watch for redness, warmth, and swelling. ? Wear padded socks and comfortable   shoes. These help protect your feet.  Do not do things that put pressure on your damaged nerve.  Do not smoke. Smoking keeps blood from getting to damaged nerves.  Avoid or limit alcohol. Too much alcohol can cause a lack of B vitamins. These vitamins are needed for healthy nerves.  Develop a good  support system. Coping with peripheral neuropathy can be stressful. Talk to a mental health specialist or join a support group if you are struggling.  Follow up with your health care provider as directed. Contact a health care provider if:  You have new signs or symptoms of peripheral neuropathy.  You are struggling emotionally from dealing with peripheral neuropathy.  You have a fever. Get help right away if:  You have an injury or infection that is not healing.  You feel very dizzy or begin vomiting.  You have chest pain.  You have trouble breathing. This information is not intended to replace advice given to you by your health care provider. Make sure you discuss any questions you have with your health care provider. Document Released: 06/03/2002 Document Revised: 11/19/2015 Document Reviewed: 02/18/2013 Elsevier Interactive Patient Education  2017 Elsevier Inc.  

## 2016-05-18 LAB — CMP14+EGFR
ALBUMIN: 4.3 g/dL (ref 3.5–4.7)
ALK PHOS: 77 IU/L (ref 39–117)
ALT: 12 IU/L (ref 0–32)
AST: 15 IU/L (ref 0–40)
Albumin/Globulin Ratio: 2.4 — ABNORMAL HIGH (ref 1.2–2.2)
BUN / CREAT RATIO: 23 (ref 12–28)
BUN: 34 mg/dL — ABNORMAL HIGH (ref 8–27)
Bilirubin Total: 0.2 mg/dL (ref 0.0–1.2)
CO2: 22 mmol/L (ref 18–29)
CREATININE: 1.5 mg/dL — AB (ref 0.57–1.00)
Calcium: 9.1 mg/dL (ref 8.7–10.3)
Chloride: 104 mmol/L (ref 96–106)
GFR calc Af Amer: 35 mL/min/{1.73_m2} — ABNORMAL LOW (ref >59)
GFR, EST NON AFRICAN AMERICAN: 31 mL/min/{1.73_m2} — AB (ref >59)
GLOBULIN, TOTAL: 1.8 g/dL (ref 1.5–4.5)
Glucose: 94 mg/dL (ref 65–99)
POTASSIUM: 4.7 mmol/L (ref 3.5–5.2)
SODIUM: 144 mmol/L (ref 134–144)
Total Protein: 6.1 g/dL (ref 6.0–8.5)

## 2016-07-16 ENCOUNTER — Emergency Department (HOSPITAL_COMMUNITY): Payer: Medicare Other

## 2016-07-16 ENCOUNTER — Encounter (HOSPITAL_COMMUNITY): Payer: Self-pay

## 2016-07-16 ENCOUNTER — Emergency Department (HOSPITAL_COMMUNITY)
Admission: EM | Admit: 2016-07-16 | Discharge: 2016-07-16 | Disposition: A | Discharge status: Home / Self Care | Payer: Medicare Other | Attending: Emergency Medicine | Admitting: Emergency Medicine

## 2016-07-16 DIAGNOSIS — J45909 Unspecified asthma, uncomplicated: Secondary | ICD-10-CM | POA: Diagnosis not present

## 2016-07-16 DIAGNOSIS — F0390 Unspecified dementia without behavioral disturbance: Secondary | ICD-10-CM | POA: Diagnosis not present

## 2016-07-16 DIAGNOSIS — I10 Essential (primary) hypertension: Secondary | ICD-10-CM | POA: Diagnosis not present

## 2016-07-16 DIAGNOSIS — R55 Syncope and collapse: Secondary | ICD-10-CM | POA: Insufficient documentation

## 2016-07-16 DIAGNOSIS — Z79899 Other long term (current) drug therapy: Secondary | ICD-10-CM | POA: Diagnosis not present

## 2016-07-16 LAB — CBC WITH DIFFERENTIAL/PLATELET
Basophils Absolute: 0 10*3/uL (ref 0.0–0.1)
Basophils Relative: 0 %
EOS ABS: 0 10*3/uL (ref 0.0–0.7)
EOS PCT: 1 %
HCT: 31.6 % — ABNORMAL LOW (ref 36.0–46.0)
Hemoglobin: 10.7 g/dL — ABNORMAL LOW (ref 12.0–15.0)
LYMPHS ABS: 0.8 10*3/uL (ref 0.7–4.0)
Lymphocytes Relative: 11 %
MCH: 30.1 pg (ref 26.0–34.0)
MCHC: 33.9 g/dL (ref 30.0–36.0)
MCV: 88.8 fL (ref 78.0–100.0)
MONO ABS: 0.4 10*3/uL (ref 0.1–1.0)
MONOS PCT: 5 %
Neutro Abs: 5.8 10*3/uL (ref 1.7–7.7)
Neutrophils Relative %: 83 %
Platelets: 171 10*3/uL (ref 150–400)
RBC: 3.56 MIL/uL — ABNORMAL LOW (ref 3.87–5.11)
RDW: 14 % (ref 11.5–15.5)
WBC: 6.9 10*3/uL (ref 4.0–10.5)

## 2016-07-16 LAB — BASIC METABOLIC PANEL
Anion gap: 8 (ref 5–15)
BUN: 26 mg/dL — AB (ref 6–20)
CHLORIDE: 104 mmol/L (ref 101–111)
CO2: 25 mmol/L (ref 22–32)
CREATININE: 1.52 mg/dL — AB (ref 0.44–1.00)
Calcium: 8.7 mg/dL — ABNORMAL LOW (ref 8.9–10.3)
GFR calc Af Amer: 34 mL/min — ABNORMAL LOW (ref >60)
GFR, EST NON AFRICAN AMERICAN: 29 mL/min — AB (ref >60)
GLUCOSE: 120 mg/dL — AB (ref 65–99)
Potassium: 4.1 mmol/L (ref 3.5–5.1)
SODIUM: 137 mmol/L (ref 135–145)

## 2016-07-16 LAB — TROPONIN I

## 2016-07-16 NOTE — ED Notes (Addendum)
Pt is DNR, paperwork at bedside. 500cc NS in route

## 2016-07-16 NOTE — Discharge Instructions (Signed)
Tests showed no life-threatening conditions. Increase fluids. Follow-up your primary care doctor.

## 2016-07-16 NOTE — ED Provider Notes (Signed)
[facility]PT Provider Note   CSN: [id number] Arrival date & time: 07/16/16  1946  By signing my name below, I, Elder Negus, attest that this documentation has been prepared under the direction and in the presence of Donnetta Hutching, MD. Electronically Signed: Elder Negus, Scribe. 07/16/16. 11:11 PM.   History   Chief Complaint Chief Complaint  Patient presents with  . Near Syncope     HPILevel V caveat for mild dementia [name] is a 83 y.o. female with history of HTN presents to the ED for evaluation following a witnessed syncopal episode at home today. The patient's granddaughter was at home with the patient tonight and states that at 1800 she was eating dinner in her usual health. However following her meal the granddaughter saw the patient "slump over, become unresponsive, face pale, and lips slightly cyanotic". The patient regained conciousness, family called for medic transport, and the patient was said to have another episode in their care. At interview, the patient denies any active complaints. She denies any pain, chest pain, or dyspnea today. She is oriented and conversational. The daughter does believe she is losing weight and has occasional palpitations. She otherwise has no complaints at this time.  The history is provided by the patient and a relative. No language interpreter was used.    Past Medical History:  Diagnosis Date  . Anxiety   . Hypertension   . Neuropathic arthropathy   . OAB (overactive bladder)     Patient Active Problem List   Diagnosis Date Noted  . GAD (generalized anxiety disorder) 05/17/2016  . Pain medication agreement signed 02/15/2016  . Opioid dependence (HCC) 02/15/2016  . Vitamin D deficiency 07/29/2014  . Vitamin B 12 deficiency 07/29/2014  . Peripheral neuropathy (HCC) 11/21/2013  . Overactive bladder 11/21/2013  . Depression 11/21/2013  . Scoliosis 04/02/2013  . Spinal stenosis, lumbar region, with neurogenic  claudication 04/02/2013  . Chronic pain 10/21/2012  . Asthma 07/19/2007    History reviewed. No pertinent surgical history.  OB History    No data available       Home Medications    Prior to Admission medications   Medication Sig Start Date End Date Taking? Authorizing Provider  arformoterol (BROVANA) 15 MCG/2ML NEBU Take 2 mLs (15 mcg total) by nebulization 2 (two) times daily. 10/16/15  Yes [name] Hawks, [name]  budesonide (PULMICORT) 0.5 MG/2ML nebulizer solution Take 2 mLs (0.5 mg total) by nebulization 2 (two) times daily. 10/16/15  Yes [name], [name]  Cholecalciferol (VITAMIN D3 PO) Take 1 tablet by mouth 2 (two) times daily.   Yes Historical Provider, MD  ciprofloxacin (CIPRO) 250 MG tablet Take 250 mg by mouth every 3 (three) days.   Yes Historical Provider, MD  ferrous sulfate 325 (65 FE) MG tablet Take 325 mg by mouth daily with breakfast.   Yes Historical Provider, MD  gabapentin (NEURONTIN) 600 MG tablet Take 600 mg by mouth 3 (three) times daily.   Yes Historical Provider, MD  HYDROcodone-acetaminophen (NORCO) 7.5-325 MG tablet Take 1 tablet by mouth 4 (four) times daily.   Yes Historical Provider, MD  lisinopril-hydrochlorothiazide (PRINZIDE,ZESTORETIC) 20-12.5 MG tablet Take 1 tablet by mouth daily. 02/15/16  Yes [name], [name]  LORazepam (ATIVAN) 0.5 MG tablet Take 0.5 tablets (0.25 mg total) by mouth every 12 (twelve) hours as needed for anxiety. 05/17/16  Yes [name] Hawks, [name]  Mometasone Furoate 200 MCG/ACT AERO Inhale 2 puffs into the lungs daily. 05/17/16  Yes  Junie Spencerhristy A Hawks, [name]  montelukast (SINGULAIR) 10 MG tablet Take 1 tablet (10 mg total) by mouth at bedtime. 05/17/16  Yes [name] Hawks, [name]  psyllium (HYDROCIL/METAMUCIL) 95 % PACK Take 1 packet by mouth daily. Reported on 08/25/2015   Yes Historical Provider, MD  sertraline (ZOLOFT) 50 MG tablet Take 1 tablet (50 mg total) by mouth daily. 05/17/16  Yes Junie Spencerhristy A Hawks, [name]  solifenacin  (VESICARE) 10 MG tablet Take 1 tablet (10 mg total) by mouth daily. Patient taking differently: Take 5 mg by mouth daily.  05/17/16  Yes Junie Spencerhristy A Hawks, [name]  Incontinence Supply Disposable (PROCARE ADULT BRIEFS MEDIUM) MISC Use as needed for incontinence 04/30/15   Junie Spencerhristy A Hawks, [name]    Family History Family History  Problem Relation Age of Onset  . Dementia Father   . Asthma Sister   . Asthma Mother     Social History Social History  Substance Use Topics  . Smoking status: Never Smoker  . Smokeless tobacco: Never Used  . Alcohol use No     Allergies   Penicillins   Review of Systems Review of Systems  Reason unable to perform ROS: mild dementia.   10 systems reviewed and all are negative for acute change except as noted in the HPI.    Physical Exam Updated Vital Signs BP 119/55 (BP Location: Left Arm)   Pulse (!) 56   Temp 97.9 F (36.6 C) (Oral)   Resp 16   SpO2 99%   Physical Exam  Constitutional: She is oriented to person, place, and time. She appears well-developed and well-nourished. No distress.  HENT:  Head: Normocephalic and atraumatic.  Eyes: Conjunctivae are normal.  Neck: Neck supple.  Cardiovascular: Normal rate and regular rhythm.   Pulmonary/Chest: Effort normal and breath sounds normal.  Abdominal: Soft. Bowel sounds are normal.  Musculoskeletal: Normal range of motion.  Neurological: She is alert and oriented to person, place, and time.  Skin: Skin is warm and dry.  Psychiatric: She has a normal mood and affect. Her behavior is normal.  Nursing note and vitals reviewed.    ED Treatments / Results  DIAGNOSTIC STUDIES: Oxygen Saturation is 99% on room air normal by my interpretation.    COORDINATION OF CARE: 8:25 PM Discussed treatment plan with pt at bedside which includes a basic laboratory workup with urinalysis and pt agreed to plan.  Labs (all labs ordered are listed, but only abnormal results are displayed) Labs Reviewed    CBC WITH DIFFERENTIAL/PLATELET - Abnormal; Notable for the following:       Result Value   RBC 3.56 (*)    Hemoglobin 10.7 (*)    HCT 31.6 (*)    All other components within normal limits  BASIC METABOLIC PANEL - Abnormal; Notable for the following:    Glucose, Bld 120 (*)    BUN 26 (*)    Creatinine, Ser 1.52 (*)    Calcium 8.7 (*)    GFR calc non Af Amer 29 (*)    GFR calc Af Amer 34 (*)    All other components within normal limits  TROPONIN I  URINALYSIS, ROUTINE W REFLEX MICROSCOPIC    EKG  EKG Interpretation  Date/Time:  Saturday July 16 2016 19:48:42 EST Ventricular Rate:  86 PR Interval:    QRS Duration: 123 QT Interval:  445 QTC Calculation: 438 R Axis:   34 Text Interpretation:  Atrial fibrillation Nonspecific intraventricular conduction delay Confirmed by Gad Aymond  MD, Detravion Tester ([id number])  on 07/16/2016 8:39:19 PM       Radiology Dg Chest Port 1 View  Result Date: 07/16/2016 CLINICAL DATA:  Syncopal episode at home today. EXAM: PORTABLE CHEST 1 VIEW COMPARISON:  05/21/2013 FINDINGS: Stable borderline cardiomegaly and ectasia of the thoracic aorta. Aortic atherosclerosis. Both lungs are clear. No evidence of pneumothorax or pleural effusion. Several old right posterior rib fracture deformities again noted. IMPRESSION: Stable exam.  No active cardiopulmonary disease. Electronically Signed   By: Myles Rosenthal M.D.   On: 07/16/2016 21:19    Procedures Procedures (including critical care time)  Medications Ordered in ED Medications - No data to display   Initial Impression / Assessment and Plan / ED Course  I have reviewed the triage vital signs and the nursing notes.  Pertinent labs & imaging results that were available during my care of the patient were reviewed by me and considered in my medical decision making (see chart for details).     Family reports baseline behavior. Screening labs and EKG showed no acute changes.  Final Clinical Impressions(s) / ED  Diagnoses   Final diagnoses:  Syncope, unspecified syncope type    New Prescriptions New Prescriptions   No medications on file  I personally performed the services described in this documentation, which was scribed in my presence. The recorded information has been reviewed and is accurate.     Donnetta Hutching, MD 07/16/16 [phone]

## 2016-07-16 NOTE — ED Triage Notes (Signed)
EMS called out for syncopal episode at home. Per family patient LOC at dinner time. EMS reports LOC of around 2 minutes in route. SBPs 80s

## 2016-07-27 ENCOUNTER — Ambulatory Visit (INDEPENDENT_AMBULATORY_CARE_PROVIDER_SITE_OTHER): Payer: Medicare Other | Admitting: Family Medicine

## 2016-07-27 DIAGNOSIS — I10 Essential (primary) hypertension: Secondary | ICD-10-CM

## 2016-07-27 DIAGNOSIS — M48061 Spinal stenosis, lumbar region without neurogenic claudication: Secondary | ICD-10-CM

## 2016-07-27 DIAGNOSIS — R296 Repeated falls: Secondary | ICD-10-CM | POA: Diagnosis not present

## 2016-07-27 DIAGNOSIS — G894 Chronic pain syndrome: Secondary | ICD-10-CM | POA: Diagnosis not present

## 2016-07-29 ENCOUNTER — Other Ambulatory Visit: Payer: Self-pay | Admitting: Family

## 2016-08-19 ENCOUNTER — Ambulatory Visit (INDEPENDENT_AMBULATORY_CARE_PROVIDER_SITE_OTHER): Payer: Medicare Other | Admitting: Family

## 2016-08-19 ENCOUNTER — Encounter: Payer: Self-pay | Admitting: Family

## 2016-08-19 VITALS — BP 154/66 | HR 61 | Temp 97.3°F

## 2016-08-19 DIAGNOSIS — G894 Chronic pain syndrome: Secondary | ICD-10-CM | POA: Diagnosis not present

## 2016-08-19 DIAGNOSIS — F411 Generalized anxiety disorder: Secondary | ICD-10-CM

## 2016-08-19 DIAGNOSIS — E559 Vitamin D deficiency, unspecified: Secondary | ICD-10-CM

## 2016-08-19 DIAGNOSIS — N3281 Overactive bladder: Secondary | ICD-10-CM

## 2016-08-19 DIAGNOSIS — F339 Major depressive disorder, recurrent, unspecified: Secondary | ICD-10-CM | POA: Diagnosis not present

## 2016-08-19 DIAGNOSIS — I10 Essential (primary) hypertension: Secondary | ICD-10-CM | POA: Diagnosis not present

## 2016-08-19 DIAGNOSIS — R4182 Altered mental status, unspecified: Secondary | ICD-10-CM

## 2016-08-19 DIAGNOSIS — G6289 Other specified polyneuropathies: Secondary | ICD-10-CM | POA: Diagnosis not present

## 2016-08-19 DIAGNOSIS — J45909 Unspecified asthma, uncomplicated: Secondary | ICD-10-CM

## 2016-08-19 DIAGNOSIS — F1120 Opioid dependence, uncomplicated: Secondary | ICD-10-CM

## 2016-08-19 DIAGNOSIS — E538 Deficiency of other specified B group vitamins: Secondary | ICD-10-CM | POA: Diagnosis not present

## 2016-08-19 DIAGNOSIS — Z0289 Encounter for other administrative examinations: Secondary | ICD-10-CM

## 2016-08-19 LAB — URINALYSIS, COMPLETE
BILIRUBIN UA: NEGATIVE
Glucose, UA: NEGATIVE
Ketones, UA: NEGATIVE
Nitrite, UA: NEGATIVE
PH UA: 6 (ref 5.0–7.5)
PROTEIN UA: NEGATIVE
Specific Gravity, UA: 1.02 (ref 1.005–1.030)
Urobilinogen, Ur: 0.2 mg/dL (ref 0.2–1.0)

## 2016-08-19 LAB — MICROSCOPIC EXAMINATION

## 2016-08-19 MED ORDER — HYDROCODONE-ACETAMINOPHEN 5-325 MG PO TABS: 1.0000 | ORAL_TABLET | Freq: Two times a day (BID) | ORAL | 0 refills | Status: DC | PRN

## 2016-08-19 NOTE — Progress Notes (Signed)
Subjective:    Patient ID: [name], female    DOB: July 07, 1924, 83 y.o.   MRN: [id number]  Pt presents to the office today for chronic follow up. Daughter states pt's dementia is worsen.   Asthma  She complains of hoarse voice, shortness of breath ("once in awhile") and wheezing ("in the AM"). There is no difficulty breathing or frequent throat clearing. This is a chronic problem. The current episode started more than 1 year ago. The problem occurs intermittently. The problem has been waxing and waning. Pertinent negatives include no ear congestion, headaches, nasal congestion, rhinorrhea, sneezing or sore throat. Her symptoms are aggravated by any activity. Her symptoms are alleviated by leukotriene antagonist and rest. She reports moderate improvement on treatment. Her past medical history is significant for asthma.  Hypertension  This is a chronic problem. The current episode started more than 1 year ago. The problem has been resolved since onset. The problem is controlled. Associated symptoms include anxiety and shortness of breath ("once in awhile"). Pertinent negatives include no headaches, palpitations or peripheral edema. Risk factors for coronary artery disease include post-menopausal state and sedentary lifestyle. Past treatments include ACE inhibitors and diuretics. The current treatment provides significant improvement. There is no history of kidney disease, CAD/MI, CVA or heart failure. There is no history of sleep apnea or a thyroid problem.  Anxiety  Presents for follow-up visit. Onset was more than 5 years ago. The problem has been waxing and waning. Symptoms include depressed mood, excessive worry, nervous/anxious behavior and shortness of breath ("once in awhile"). Patient reports no impotence, irritability, muscle tension, palpitations, panic or suicidal ideas. Symptoms occur most days. The quality of sleep is good.   Her past medical history is significant for asthma and  depression. Past treatments include SSRIs. The treatment provided significant relief. Compliance with prior treatments has been good.  Leg Pain   The incident occurred more than 1 week ago. The quality of the pain is described as aching. The pain is at a severity of 8/10. The pain is moderate. The pain has been constant since onset. Associated symptoms include numbness. Treatments tried: gabapentin and norco. The treatment provided moderate relief.  Back Pain  This is a chronic problem. The current episode started more than 1 year ago. The problem occurs intermittently. The problem has been waxing and waning since onset. The pain is present in the lumbar spine. The quality of the pain is described as aching. The pain is at a severity of 9/10. The pain is mild. The symptoms are aggravated by bending. Associated symptoms include leg pain and numbness. Pertinent negatives include no bladder incontinence, bowel incontinence, dysuria or headaches. She has tried bed rest for the symptoms. The treatment provided mild relief.  OAB PT states she was taking vesicare 10 mg daily. States this is worse and using several depends a day.  Pain assessment: Cause of pain-  Pain location- Neuropathy and chronic back pain Pain on scale of 1-10- 8-9 Frequency- Constantly What increases pain-Moving What makes pain Better-rest and pain medication  Current medications- Norco 5-325 mg every 6 hours as needed, #60 a month Effectiveness of current meds-Stable   Urine drug screen- No, 02/15/16 Was the [place] reviewed- Yes  If yes were their any concerning findings? None, only received controlled medications from me.   Review of Systems  Constitutional: Negative.  Negative for irritability.  HENT: Positive for hoarse voice. Negative for rhinorrhea, sneezing and sore throat.   Eyes: Negative.  Respiratory: Positive for shortness of breath ("once in awhile") and wheezing ("in the AM").   Cardiovascular: Negative.   Negative for palpitations.  Gastrointestinal: Negative.  Negative for bowel incontinence.  Endocrine: Negative.   Genitourinary: Negative.  Negative for bladder incontinence, dysuria and impotence.  Musculoskeletal: Positive for back pain.  Neurological: Positive for numbness. Negative for headaches.  Hematological: Negative.   Psychiatric/Behavioral: Negative for suicidal ideas. The patient is nervous/anxious.   All other systems reviewed and are negative.      Objective:   Physical Exam  Constitutional: She is oriented to person, place, and time. She appears well-developed and well-nourished. No distress.  HENT:  Head: Normocephalic and atraumatic.  Right Ear: External ear normal.  Left Ear: External ear normal.  Nose: Nose normal.  Mouth/Throat: Oropharynx is clear and moist.  Eyes: Pupils are equal, round, and reactive to light.  Neck: Normal range of motion. Neck supple. No thyromegaly present.  Cardiovascular: Normal rate and intact distal pulses.   Murmur heard. Irregular rhythm   Pulmonary/Chest: Effort normal. No respiratory distress. She has decreased breath sounds. She has no wheezes.  Diminished breath sounds   Abdominal: Soft. Bowel sounds are normal. She exhibits no distension. There is no tenderness.  Musculoskeletal: Normal range of motion. She exhibits no edema or tenderness.  Mild lower extremity weakness, pt in wheelchair    Neurological: She is alert and oriented to person, place, and time. She has normal reflexes. No cranial nerve deficit.  Skin: Skin is warm and dry.  Psychiatric: She has a normal mood and affect. Her behavior is normal. Judgment and thought content normal.  Vitals reviewed.   BP (!) 154/66 Comment: Has not had her blod pressure medication today.  Pulse 61   Temp 97.3 F (36.3 C) (Oral)      Assessment & Plan:  1. Uncomplicated asthma, unspecified asthma severity, unspecified whether persistent - CMP14+EGFR - CBC with  Differential/Platelet  2. Other polyneuropathy ([place]) - CMP14+EGFR - CBC with Differential/Platelet - HYDROcodone-acetaminophen (NORCO/VICODIN) 5-325 MG tablet; Take 1 tablet by mouth every 12 (twelve) hours as needed for moderate pain.  Dispense: 60 tablet; Refill: 0 - HYDROcodone-acetaminophen (NORCO/VICODIN) 5-325 MG tablet; Take 1 tablet by mouth every 12 (twelve) hours as needed for moderate pain.  Dispense: 60 tablet; Refill: 0 - HYDROcodone-acetaminophen (NORCO/VICODIN) 5-325 MG tablet; Take 1 tablet by mouth every 12 (twelve) hours as needed for moderate pain.  Dispense: 60 tablet; Refill: 0  3. Vitamin D deficiency - CMP14+EGFR - CBC with Differential/Platelet  4. Vitamin B 12 deficiency - CMP14+EGFR - CBC with Differential/Platelet  5. Pain medication agreement signed - CMP14+EGFR - CBC with Differential/Platelet - HYDROcodone-acetaminophen (NORCO/VICODIN) 5-325 MG tablet; Take 1 tablet by mouth every 12 (twelve) hours as needed for moderate pain.  Dispense: 60 tablet; Refill: 0 - HYDROcodone-acetaminophen (NORCO/VICODIN) 5-325 MG tablet; Take 1 tablet by mouth every 12 (twelve) hours as needed for moderate pain.  Dispense: 60 tablet; Refill: 0 - HYDROcodone-acetaminophen (NORCO/VICODIN) 5-325 MG tablet; Take 1 tablet by mouth every 12 (twelve) hours as needed for moderate pain.  Dispense: 60 tablet; Refill: 0  6. GAD (generalized anxiety disorder) - CMP14+EGFR - CBC with Differential/Platelet  7. Episode of recurrent major depressive disorder, unspecified depression episode severity ([place]) - CMP14+EGFR - CBC with Differential/Platelet  8. Overactive bladder -Urinalysis pending- Pt to bring sample back If no UTI will change Vesicare - CMP14+EGFR - CBC with Differential/Platelet  9. Chronic pain syndrome - CMP14+EGFR - CBC with Differential/Platelet -  HYDROcodone-acetaminophen (NORCO/VICODIN) 5-325 MG tablet; Take 1 tablet by mouth every 12 (twelve) hours as needed for  moderate pain.  Dispense: 60 tablet; Refill: 0 - HYDROcodone-acetaminophen (NORCO/VICODIN) 5-325 MG tablet; Take 1 tablet by mouth every 12 (twelve) hours as needed for moderate pain.  Dispense: 60 tablet; Refill: 0 - HYDROcodone-acetaminophen (NORCO/VICODIN) 5-325 MG tablet; Take 1 tablet by mouth every 12 (twelve) hours as needed for moderate pain.  Dispense: 60 tablet; Refill: 0  10. Uncomplicated opioid dependence ([place]) - CMP14+EGFR - CBC with Differential/Platelet  11. Altered mental status, unspecified altered mental status type - CMP14+EGFR - CBC with Differential/Platelet - Urinalysis, Complete - Urine culture  12. Essential hypertension -Will hold lisinopril/HCTZ 20-12.5 mg today, PT's BP is stable today and has not taken any medicaiton. Pt had syncope episode will see if this is related to hypotension.  Also pt having increase urinary frequency    Continue all meds Labs pending Health Maintenance reviewed Diet and exercise encouraged RTO 3 months   [name], [name]

## 2016-08-20 LAB — CMP14+EGFR
ALK PHOS: 86 IU/L (ref 39–117)
ALT: 19 IU/L (ref 0–32)
AST: 27 IU/L (ref 0–40)
Albumin/Globulin Ratio: 2.3 — ABNORMAL HIGH (ref 1.2–2.2)
Albumin: 4.5 g/dL (ref 3.5–4.7)
BILIRUBIN TOTAL: 0.3 mg/dL (ref 0.0–1.2)
BUN/Creatinine Ratio: 25 (ref 12–28)
BUN: 32 mg/dL — AB (ref 8–27)
CHLORIDE: 101 mmol/L (ref 96–106)
CO2: 25 mmol/L (ref 18–29)
CREATININE: 1.29 mg/dL — AB (ref 0.57–1.00)
Calcium: 9.4 mg/dL (ref 8.7–10.3)
GFR calc Af Amer: 42 mL/min/{1.73_m2} — ABNORMAL LOW (ref >59)
GFR calc non Af Amer: 37 mL/min/{1.73_m2} — ABNORMAL LOW (ref >59)
GLUCOSE: 84 mg/dL (ref 65–99)
Globulin, Total: 2 g/dL (ref 1.5–4.5)
Potassium: 5 mmol/L (ref 3.5–5.2)
SODIUM: 140 mmol/L (ref 134–144)
Total Protein: 6.5 g/dL (ref 6.0–8.5)

## 2016-08-20 LAB — CBC WITH DIFFERENTIAL/PLATELET
BASOS ABS: 0 10*3/uL (ref 0.0–0.2)
Basos: 0 %
EOS (ABSOLUTE): 0.1 10*3/uL (ref 0.0–0.4)
EOS: 3 %
HEMATOCRIT: 34.8 % (ref 34.0–46.6)
Hemoglobin: 11 g/dL — ABNORMAL LOW (ref 11.1–15.9)
IMMATURE GRANULOCYTES: 0 %
Immature Grans (Abs): 0 10*3/uL (ref 0.0–0.1)
Lymphocytes Absolute: 0.9 10*3/uL (ref 0.7–3.1)
Lymphs: 19 %
MCH: 28.4 pg (ref 26.6–33.0)
MCHC: 31.6 g/dL (ref 31.5–35.7)
MCV: 90 fL (ref 79–97)
MONOCYTES: 5 %
MONOS ABS: 0.2 10*3/uL (ref 0.1–0.9)
NEUTROS PCT: 73 %
Neutrophils Absolute: 3.5 10*3/uL (ref 1.4–7.0)
Platelets: 171 10*3/uL (ref 150–379)
RBC: 3.88 x10E6/uL (ref 3.77–5.28)
RDW: 14.1 % (ref 12.3–15.4)
WBC: 4.8 10*3/uL (ref 3.4–10.8)

## 2016-08-22 ENCOUNTER — Telehealth: Payer: Self-pay

## 2016-08-22 ENCOUNTER — Other Ambulatory Visit: Payer: Self-pay | Admitting: Family

## 2016-08-22 LAB — URINE CULTURE

## 2016-08-22 MED ORDER — MIRABEGRON ER 25 MG PO TB24: 25.0000 mg | ORAL_TABLET | Freq: Every day | ORAL | 1 refills | Status: DC

## 2016-08-22 MED ORDER — SULFAMETHOXAZOLE-TRIMETHOPRIM 800-160 MG PO TABS: 1.0000 | ORAL_TABLET | Freq: Two times a day (BID) | ORAL | 0 refills | Status: DC

## 2016-08-22 NOTE — Telephone Encounter (Signed)
Vesicare changed to [employer] Prescription sent to pharmacy per insurance.

## 2016-08-22 NOTE — Telephone Encounter (Signed)
Left message for patient to call if any questions. Vesicare changed to [employer] because of insurance coverage.

## 2016-08-23 ENCOUNTER — Telehealth: Payer: Self-pay | Admitting: *Deleted

## 2016-08-23 NOTE — Telephone Encounter (Signed)
I already changed this to myrbetriq.

## 2016-08-23 NOTE — Telephone Encounter (Signed)
[name] aware per dpr.

## 2016-08-25 ENCOUNTER — Telehealth: Payer: Self-pay

## 2016-08-26 NOTE — Telephone Encounter (Signed)
Can we try to get this approved? Pt is 89 and can not tolerate oxybutynin

## 2016-09-07 ENCOUNTER — Telehealth: Payer: Self-pay | Admitting: Family

## 2016-09-07 MED ORDER — MEMANTINE HCL-DONEPEZIL HCL ER 28-10 MG PO CP24: 1.0000 | ORAL_CAPSULE | Freq: Every day | ORAL | 2 refills | Status: DC

## 2016-09-07 MED ORDER — MEMANTINE HCL-DONEPEZIL HCL 7 & 14 & 21 &28 -10 MG PO C4PK: 1.0000 | EXTENDED_RELEASE_CAPSULE | Freq: Every day | ORAL | 0 refills | Status: DC

## 2016-09-07 NOTE — Telephone Encounter (Signed)
Please call in Namzaric starter pack. I ordered in EPIC but would not e prescribe. Please let family know and she will needs to bring another urine in to make sure UTI is resolved.

## 2016-09-07 NOTE — Telephone Encounter (Signed)
[facility] states that [employer] needs prior [facility] [place]. Namzaric does not have a starter pack and recommenced doing Namanda and Aricept sperate since needing a PA.

## 2016-09-08 NOTE — Telephone Encounter (Signed)
Pts family is aware

## 2016-09-08 NOTE — Telephone Encounter (Signed)
Starter pack and one month sample ready to give patient

## 2016-09-09 ENCOUNTER — Other Ambulatory Visit: Payer: Medicare Other

## 2016-09-09 ENCOUNTER — Other Ambulatory Visit (INDEPENDENT_AMBULATORY_CARE_PROVIDER_SITE_OTHER): Payer: Medicare Other

## 2016-09-09 DIAGNOSIS — R3989 Other symptoms and signs involving the genitourinary system: Secondary | ICD-10-CM

## 2016-09-09 LAB — MICROSCOPIC EXAMINATION
BACTERIA UA: NONE SEEN
Renal Epithel, UA: NONE SEEN /hpf

## 2016-09-09 LAB — URINALYSIS, COMPLETE
Bilirubin, UA: NEGATIVE
GLUCOSE, UA: NEGATIVE
Ketones, UA: NEGATIVE
Leukocytes, UA: NEGATIVE
NITRITE UA: NEGATIVE
PROTEIN UA: NEGATIVE
RBC, UA: NEGATIVE
Urobilinogen, Ur: 0.2 mg/dL (ref 0.2–1.0)
pH, UA: 5.5 (ref 5.0–7.5)

## 2016-09-12 ENCOUNTER — Other Ambulatory Visit: Payer: Self-pay | Admitting: Family

## 2016-09-12 LAB — URINE CULTURE

## 2016-09-12 MED ORDER — NITROFURANTOIN MONOHYD MACRO 100 MG PO CAPS: 100.0000 mg | ORAL_CAPSULE | Freq: Two times a day (BID) | ORAL | 0 refills | Status: DC

## 2016-09-13 ENCOUNTER — Telehealth: Payer: Self-pay

## 2016-09-13 NOTE — Telephone Encounter (Signed)
Insurance denied namzaric. Pt had UTI, so we could wait to see if memory improves once UTI is resolved?

## 2016-09-15 NOTE — Telephone Encounter (Signed)
Aware. 

## 2016-09-19 ENCOUNTER — Ambulatory Visit: Payer: Medicare Other | Admitting: Family

## 2016-09-23 ENCOUNTER — Ambulatory Visit (INDEPENDENT_AMBULATORY_CARE_PROVIDER_SITE_OTHER): Payer: Medicare Other | Admitting: Family Medicine

## 2016-09-23 DIAGNOSIS — I10 Essential (primary) hypertension: Secondary | ICD-10-CM

## 2016-09-23 DIAGNOSIS — M48061 Spinal stenosis, lumbar region without neurogenic claudication: Secondary | ICD-10-CM

## 2016-09-23 DIAGNOSIS — G894 Chronic pain syndrome: Secondary | ICD-10-CM

## 2016-09-23 DIAGNOSIS — R296 Repeated falls: Secondary | ICD-10-CM

## 2016-09-29 ENCOUNTER — Telehealth: Payer: Self-pay | Admitting: Family

## 2016-09-29 ENCOUNTER — Other Ambulatory Visit: Payer: Self-pay | Admitting: Family

## 2016-09-29 MED ORDER — OXYBUTYNIN CHLORIDE ER 5 MG PO TB24: 5.0000 mg | ORAL_TABLET | Freq: Every day | ORAL | 1 refills | Status: DC

## 2016-09-29 NOTE — Telephone Encounter (Signed)
Daughter aware rx sent to pharmacy

## 2016-09-29 NOTE — Telephone Encounter (Signed)
Oxybutynin  Prescription sent to pharmacy

## 2016-10-05 ENCOUNTER — Telehealth: Payer: Self-pay | Admitting: Family

## 2016-10-05 MED ORDER — OXYBUTYNIN CHLORIDE 5 MG PO TABS: 5.0000 mg | ORAL_TABLET | Freq: Three times a day (TID) | ORAL | 1 refills | Status: DC

## 2016-10-05 NOTE — Telephone Encounter (Signed)
New Prescription sent to pharmacy 

## 2016-10-26 ENCOUNTER — Other Ambulatory Visit: Payer: Medicare Other

## 2016-10-26 ENCOUNTER — Other Ambulatory Visit: Payer: Self-pay | Admitting: *Deleted

## 2016-10-26 DIAGNOSIS — R3989 Other symptoms and signs involving the genitourinary system: Secondary | ICD-10-CM

## 2016-10-27 LAB — MICROSCOPIC EXAMINATION
RBC MICROSCOPIC, UA: NONE SEEN /HPF (ref 0–?)
Renal Epithel, UA: NONE SEEN /hpf

## 2016-10-27 LAB — URINALYSIS, COMPLETE
BILIRUBIN UA: NEGATIVE
GLUCOSE, UA: NEGATIVE
KETONES UA: NEGATIVE
NITRITE UA: NEGATIVE
RBC, UA: NEGATIVE
SPEC GRAV UA: 1.02 (ref 1.005–1.030)
UUROB: 0.2 mg/dL (ref 0.2–1.0)
pH, UA: 5 (ref 5.0–7.5)

## 2016-10-28 ENCOUNTER — Other Ambulatory Visit: Payer: Self-pay | Admitting: Family

## 2016-10-28 LAB — URINE CULTURE

## 2016-10-28 MED ORDER — NITROFURANTOIN MONOHYD MACRO 100 MG PO CAPS: 100.0000 mg | ORAL_CAPSULE | Freq: Two times a day (BID) | ORAL | 0 refills | Status: DC

## 2016-10-31 ENCOUNTER — Other Ambulatory Visit: Payer: Self-pay | Admitting: Family

## 2016-10-31 DIAGNOSIS — G6289 Other specified polyneuropathies: Secondary | ICD-10-CM

## 2016-10-31 DIAGNOSIS — G894 Chronic pain syndrome: Secondary | ICD-10-CM

## 2016-10-31 DIAGNOSIS — Z0289 Encounter for other administrative examinations: Secondary | ICD-10-CM

## 2016-11-01 MED ORDER — HYDROCODONE-ACETAMINOPHEN 5-325 MG PO TABS: 1.0000 | ORAL_TABLET | Freq: Two times a day (BID) | ORAL | 0 refills | Status: DC | PRN

## 2016-11-01 NOTE — Telephone Encounter (Signed)
RX ready for pick up 

## 2016-11-17 ENCOUNTER — Ambulatory Visit: Payer: Medicare Other | Admitting: Family

## 2016-11-22 ENCOUNTER — Encounter: Payer: Self-pay | Admitting: Family

## 2016-11-22 ENCOUNTER — Ambulatory Visit (INDEPENDENT_AMBULATORY_CARE_PROVIDER_SITE_OTHER): Payer: Medicare Other | Admitting: Family

## 2016-11-22 VITALS — BP 155/65 | HR 60 | Temp 97.7°F

## 2016-11-22 DIAGNOSIS — I10 Essential (primary) hypertension: Secondary | ICD-10-CM

## 2016-11-22 DIAGNOSIS — J45909 Unspecified asthma, uncomplicated: Secondary | ICD-10-CM

## 2016-11-22 DIAGNOSIS — M48062 Spinal stenosis, lumbar region with neurogenic claudication: Secondary | ICD-10-CM | POA: Diagnosis not present

## 2016-11-22 DIAGNOSIS — F1120 Opioid dependence, uncomplicated: Secondary | ICD-10-CM | POA: Diagnosis not present

## 2016-11-22 DIAGNOSIS — G6289 Other specified polyneuropathies: Secondary | ICD-10-CM | POA: Diagnosis not present

## 2016-11-22 DIAGNOSIS — Z0289 Encounter for other administrative examinations: Secondary | ICD-10-CM | POA: Diagnosis not present

## 2016-11-22 DIAGNOSIS — G894 Chronic pain syndrome: Secondary | ICD-10-CM | POA: Diagnosis not present

## 2016-11-22 DIAGNOSIS — N3281 Overactive bladder: Secondary | ICD-10-CM | POA: Diagnosis not present

## 2016-11-22 DIAGNOSIS — F411 Generalized anxiety disorder: Secondary | ICD-10-CM | POA: Diagnosis not present

## 2016-11-22 DIAGNOSIS — F339 Major depressive disorder, recurrent, unspecified: Secondary | ICD-10-CM | POA: Diagnosis not present

## 2016-11-22 MED ORDER — SERTRALINE HCL 50 MG PO TABS: 50.0000 mg | ORAL_TABLET | Freq: Every day | ORAL | 2 refills | Status: DC

## 2016-11-22 MED ORDER — CIPROFLOXACIN HCL 250 MG PO TABS: 250.0000 mg | ORAL_TABLET | Freq: Every day | ORAL | 0 refills | Status: DC

## 2016-11-22 MED ORDER — GABAPENTIN 800 MG PO TABS: 800.0000 mg | ORAL_TABLET | Freq: Three times a day (TID) | ORAL | 1 refills | Status: DC

## 2016-11-22 MED ORDER — OXYBUTYNIN CHLORIDE 5 MG PO TABS: 5.0000 mg | ORAL_TABLET | Freq: Three times a day (TID) | ORAL | 1 refills | Status: DC

## 2016-11-22 MED ORDER — GABAPENTIN 300 MG PO CAPS: ORAL_CAPSULE | ORAL | 0 refills | Status: DC

## 2016-11-22 MED ORDER — HYDROCODONE-ACETAMINOPHEN 5-325 MG PO TABS: 1.0000 | ORAL_TABLET | Freq: Two times a day (BID) | ORAL | 0 refills | Status: DC | PRN

## 2016-11-22 NOTE — Progress Notes (Signed)
Subjective:    Patient ID: [name], female    DOB: 1925/04/14, 83 y.o.   MRN: [id number]  PT presents to the office today for chronic follow up.  Asthma  She complains of cough and frequent throat clearing. There is no shortness of breath or wheezing. This is a chronic problem. The current episode started more than 1 year ago. The problem occurs intermittently. The problem has been waxing and waning. Her symptoms are not alleviated by beta-agonist. Her past medical history is significant for asthma.  Anxiety  Presents for follow-up visit. Symptoms include depressed mood, excessive worry and nervous/anxious behavior. Patient reports no confusion, panic or shortness of breath. Symptoms occur occasionally.   Her past medical history is significant for asthma.  Hypertension  This is a chronic problem. The current episode started more than 1 year ago. The problem has been waxing and waning since onset. The problem is uncontrolled. Associated symptoms include anxiety. Pertinent negatives include no peripheral edema or shortness of breath. The current treatment provides moderate improvement.  Arthritis  Presents for follow-up visit. Affected locations include the right knee and left knee (back). Her pain is at a severity of 6/10.  OAB PT currently taking oxybutynin TID that helps.  Peripheral Neuropathy Pt currently taking gabapentin 800 mg TID and Norco that helps. Pt states she has a burning pain of 6 out 10.    Review of Systems  Respiratory: Positive for cough. Negative for shortness of breath and wheezing.   Musculoskeletal: Positive for arthritis.  Psychiatric/Behavioral: Negative for confusion. The patient is nervous/anxious.   All other systems reviewed and are negative.      Objective:   Physical Exam  Constitutional: She is oriented to person, place, and time. She appears well-developed and well-nourished. No distress.  HENT:  Head: Normocephalic and atraumatic.    Right Ear: External ear normal.  Left Ear: External ear normal.  Nose: Nose normal.  Mouth/Throat: Oropharynx is clear and moist.  Eyes: Pupils are equal, round, and reactive to light.  Neck: Normal range of motion. Neck supple. No thyromegaly present.  Cardiovascular: Normal rate, regular rhythm, normal heart sounds and intact distal pulses.   No murmur heard. Pulmonary/Chest: Effort normal and breath sounds normal. No respiratory distress. She has no wheezes.  Abdominal: Soft. Bowel sounds are normal. She exhibits no distension. There is no tenderness.  Musculoskeletal: Normal range of motion. She exhibits no edema or tenderness.  Pt in wheelchair   Neurological: She is alert and oriented to person, place, and time.  Skin: Skin is warm and dry.  Psychiatric: She has a normal mood and affect. Her behavior is normal. Judgment and thought content normal.  Vitals reviewed.     BP (!) 155/65   Pulse 60   Temp 97.7 F (36.5 C) (Oral)      Assessment & Plan:  1. GAD (generalized anxiety disorder - sertraline (ZOLOFT) 50 MG tablet; Take 1 tablet (50 mg total) by mouth daily.  Dispense: 90 tablet; Refill: 2  2. Episode of recurrent major depressive disorder, unspecified depression episode severity (HCC) - sertraline (ZOLOFT) 50 MG tablet; Take 1 tablet (50 mg total) by mouth daily.  Dispense: 90 tablet; Refill: 2  3. Essential hypertension  4. Uncomplicated asthma, unspecified asthma severity, unspecified whether persistent  5. Overactive bladder - oxybutynin (DITROPAN) 5 MG tablet; Take 1 tablet (5 mg total) by mouth 3 (three) times daily.  Dispense: 90 tablet; Refill: 1  6. Spinal stenosis, lumbar  region, with neurogenic claudicatio  7. Pain medication agreement signed - HYDROcodone-acetaminophen (NORCO/VICODIN) 5-325 MG tablet; Take 1 tablet by mouth every 12 (twelve) hours as needed for moderate pain.  Dispense: 60 tablet; Refill: 0 - HYDROcodone-acetaminophen  (NORCO/VICODIN) 5-325 MG tablet; Take 1 tablet by mouth every 12 (twelve) hours as needed for moderate pain.  Dispense: 60 tablet; Refill: 0 - HYDROcodone-acetaminophen (NORCO/VICODIN) 5-325 MG tablet; Take 1 tablet by mouth every 12 (twelve) hours as needed for moderate pain.  Dispense: 60 tablet; Refill: 0  8. Uncomplicated opioid dependence (HCC)  9. Other polyneuropathy - HYDROcodone-acetaminophen (NORCO/VICODIN) 5-325 MG tablet; Take 1 tablet by mouth every 12 (twelve) hours as needed for moderate pain.  Dispense: 60 tablet; Refill: 0 - HYDROcodone-acetaminophen (NORCO/VICODIN) 5-325 MG tablet; Take 1 tablet by mouth every 12 (twelve) hours as needed for moderate pain.  Dispense: 60 tablet; Refill: 0 - HYDROcodone-acetaminophen (NORCO/VICODIN) 5-325 MG tablet; Take 1 tablet by mouth every 12 (twelve) hours as needed for moderate pain.  Dispense: 60 tablet; Refill: 0 - gabapentin (NEURONTIN) 800 MG tablet; Take 1 tablet (800 mg total) by mouth 3 (three) times daily.  Dispense: 90 tablet; Refill: 1 - gabapentin (NEURONTIN) 300 MG capsule; TAKE (1) CAPSULE THREE TIMES DAILY. PRN  Dispense: 90 capsule; Refill: 0  10. Chronic pain syndrome - HYDROcodone-acetaminophen (NORCO/VICODIN) 5-325 MG tablet; Take 1 tablet by mouth every 12 (twelve) hours as needed for moderate pain.  Dispense: 60 tablet; Refill: 0 - HYDROcodone-acetaminophen (NORCO/VICODIN) 5-325 MG tablet; Take 1 tablet by mouth every 12 (twelve) hours as needed for moderate pain.  Dispense: 60 tablet; Refill: 0 - HYDROcodone-acetaminophen (NORCO/VICODIN) 5-325 MG tablet; Take 1 tablet by mouth every 12 (twelve) hours as needed for moderate pain.  Dispense: 60 tablet; Refill: 0  Pt reviewed in [place] controlled database. Pt has only received controlled medication from this office.   Continue all meds Labs pending Health Maintenance reviewed Diet and exercise encouraged RTO 3 months   Jannifer Rodney, [name]

## 2016-11-22 NOTE — Patient Instructions (Signed)
Neuropathic Pain Neuropathic pain is pain caused by damage to the nerves that are responsible for certain sensations in your body (sensory nerves). The pain can be caused by damage to:  The sensory nerves that send signals to your spinal cord and brain (peripheral nervous system).  The sensory nerves in your brain or spinal cord (central nervous system). Neuropathic pain can make you more sensitive to pain. What would be a minor sensation for most people may feel very painful if you have neuropathic pain. This is usually a long-term condition that can be difficult to treat. The type of pain can differ from person to person. It may start suddenly (acute), or it may develop slowly and last for a long time (chronic). Neuropathic pain may come and go as damaged nerves heal or may stay at the same level for years. It often causes emotional distress, loss of sleep, and a lower quality of life. What are the causes? The most common cause of damage to a sensory nerve is diabetes. Many other diseases and conditions can also cause neuropathic pain. Causes of neuropathic pain can be classified as:  Toxic. Many drugs and chemicals can cause toxic damage. The most common cause of toxic neuropathic pain is damage from drug treatment for cancer (chemotherapy).  Metabolic. This type of pain can happen when a disease causes imbalances that damage nerves. Diabetes is the most common of these diseases. Vitamin B deficiency caused by long-term alcohol abuse is another common cause.  Traumatic. Any injury that cuts, crushes, or stretches a nerve can cause damage and pain. A common example is feeling pain after losing an arm or leg (phantom limb pain).  Compression-related. If a sensory nerve gets trapped or compressed for a long period of time, the blood supply to the nerve can be cut off.  Vascular. Many blood vessel diseases can cause neuropathic pain by decreasing blood supply and oxygen to nerves.  Autoimmune.  This type of pain results from diseases in which the body's defense system mistakenly attacks sensory nerves. Examples of autoimmune diseases that can cause neuropathic pain include lupus and multiple sclerosis.  Infectious. Many types of viral infections can damage sensory nerves and cause pain. Shingles infection is a common cause of this type of pain.  Inherited. Neuropathic pain can be a symptom of many diseases that are passed down through families (genetic). What are the signs or symptoms? The main symptom is pain. Neuropathic pain is often described as:  Burning.  Shock-like.  Stinging.  Hot or cold.  Itching. How is this diagnosed? No single test can diagnose neuropathic pain. Your health care provider will do a physical exam and ask you about your pain. You may use a pain scale to describe how bad your pain is. You may also have tests to see if you have a high sensitivity to pain and to help find the cause and location of any sensory nerve damage. These tests may include:  Imaging studies, such as:  X-rays.  CT scan.  MRI.  Nerve conduction studies to test how well nerve signals travel through your sensory nerves (electrodiagnostic testing).  Stimulating your sensory nerves through electrodes on your skin and measuring the response in your spinal cord and brain (somatosensory evoked potentials). How is this treated? Treatment for neuropathic pain may change over time. You may need to try different treatment options or a combination of treatments. Some options include:  Over-the-counter pain relievers.  Prescription medicines. Some medicines used to treat other   conditions may also help neuropathic pain. These include medicines to:  Control seizures (anticonvulsants).  Relieve depression (antidepressants).  Prescription-strength pain relievers (narcotics). These are usually used when other pain relievers do not help.  Transcutaneous nerve stimulation (TENS). This  uses electrical currents to block painful nerve signals. The treatment is painless.  Topical and local anesthetics. These are medicines that numb the nerves. They can be injected as a nerve block or applied to the skin.  Alternative treatments, such as:  Acupuncture.  Meditation.  Massage.  Physical therapy.  Pain management programs.  Counseling. Follow these instructions at home:  Learn as much as you can about your condition.  Take medicines only as directed by your health care provider.  Work closely with all your health care providers to find what works best for you.  Have a good support system at home.  Consider joining a chronic pain support group. Contact a health care provider if:  Your pain treatments are not helping.  You are having side effects from your medicines.  You are struggling with fatigue, mood changes, depression, or anxiety. This information is not intended to replace advice given to you by your health care provider. Make sure you discuss any questions you have with your health care provider. Document Released: 03/10/2004 Document Revised: 01/01/2016 Document Reviewed: 11/21/2013 Elsevier Interactive Patient Education  2017 Elsevier Inc.  

## 2016-11-23 ENCOUNTER — Ambulatory Visit (INDEPENDENT_AMBULATORY_CARE_PROVIDER_SITE_OTHER): Payer: Medicare Other | Admitting: Family Medicine

## 2016-11-23 DIAGNOSIS — I10 Essential (primary) hypertension: Secondary | ICD-10-CM | POA: Diagnosis not present

## 2016-11-23 DIAGNOSIS — G894 Chronic pain syndrome: Secondary | ICD-10-CM

## 2016-11-23 DIAGNOSIS — R296 Repeated falls: Secondary | ICD-10-CM

## 2016-11-23 DIAGNOSIS — M48061 Spinal stenosis, lumbar region without neurogenic claudication: Secondary | ICD-10-CM | POA: Diagnosis not present

## 2016-12-19 ENCOUNTER — Telehealth: Payer: Self-pay | Admitting: Family

## 2016-12-19 ENCOUNTER — Telehealth: Payer: Self-pay | Admitting: Family Medicine

## 2016-12-19 ENCOUNTER — Other Ambulatory Visit: Payer: Medicare Other

## 2016-12-19 DIAGNOSIS — Z87440 Personal history of urinary (tract) infections: Secondary | ICD-10-CM

## 2016-12-19 DIAGNOSIS — R300 Dysuria: Secondary | ICD-10-CM

## 2016-12-19 LAB — MICROSCOPIC EXAMINATION

## 2016-12-19 LAB — URINALYSIS, COMPLETE
Bilirubin, UA: NEGATIVE
GLUCOSE, UA: NEGATIVE
Leukocytes, UA: NEGATIVE
NITRITE UA: NEGATIVE
PH UA: 5.5 (ref 5.0–7.5)
Specific Gravity, UA: 1.02 (ref 1.005–1.030)
Urobilinogen, Ur: 0.2 mg/dL (ref 0.2–1.0)

## 2016-12-19 MED ORDER — DOXYCYCLINE HYCLATE 100 MG PO TABS: 100.0000 mg | ORAL_TABLET | Freq: Two times a day (BID) | ORAL | 0 refills | Status: DC

## 2016-12-19 NOTE — Telephone Encounter (Signed)
Patient has a history of recurrent UTIs and [facility] Patient's urinalysis showed many bacteria tones and 2+ blood and mucus and yeast. We'll treat with antibiotic, likely this is might be chronic rather than acute infection but will treat anyways and deferred for further evaluation with [name]. Arville CareJoshua [name], MD [facility] 12/19/2016, 12:42 PM

## 2016-12-19 NOTE — Telephone Encounter (Signed)
[place] to pharmacy

## 2016-12-19 NOTE — Addendum Note (Signed)
Addended by: Margorie JohnJOHNSON, Thecla Forgione M on: 12/19/2016 02:59 PM   Modules accepted: Orders

## 2016-12-19 NOTE — Telephone Encounter (Signed)
Aware kit is ready for pick up

## 2016-12-20 ENCOUNTER — Other Ambulatory Visit: Payer: Self-pay | Admitting: Family

## 2016-12-20 DIAGNOSIS — Z87440 Personal history of urinary (tract) infections: Secondary | ICD-10-CM

## 2017-01-09 ENCOUNTER — Telehealth: Payer: Self-pay | Admitting: Family

## 2017-01-09 MED ORDER — NITROFURANTOIN MACROCRYSTAL 50 MG PO CAPS: 50.0000 mg | ORAL_CAPSULE | Freq: Every day | ORAL | 1 refills | Status: DC

## 2017-01-09 NOTE — Telephone Encounter (Signed)
Caregiver [name] aware changes in medication and bringing in urine sample

## 2017-01-09 NOTE — Telephone Encounter (Signed)
Caregiver aware.

## 2017-01-09 NOTE — Telephone Encounter (Signed)
Pt can leave urine sample. Orders in EPIC. Nitrofurantoin 50 mg daily Prescription sent to pharmacy. Pt to stop daily Cirpo rx

## 2017-01-10 ENCOUNTER — Other Ambulatory Visit: Payer: Medicare Other

## 2017-01-10 DIAGNOSIS — Z87440 Personal history of urinary (tract) infections: Secondary | ICD-10-CM

## 2017-01-10 LAB — URINALYSIS, COMPLETE
BILIRUBIN UA: NEGATIVE
GLUCOSE, UA: NEGATIVE
KETONES UA: NEGATIVE
LEUKOCYTES UA: NEGATIVE
Nitrite, UA: NEGATIVE
RBC UA: NEGATIVE
SPEC GRAV UA: 1.02 (ref 1.005–1.030)
Urobilinogen, Ur: 0.2 mg/dL (ref 0.2–1.0)
pH, UA: 7 (ref 5.0–7.5)

## 2017-01-10 LAB — MICROSCOPIC EXAMINATION

## 2017-01-12 ENCOUNTER — Other Ambulatory Visit: Payer: Self-pay | Admitting: Family

## 2017-01-12 MED ORDER — CIPROFLOXACIN HCL 250 MG PO TABS: 250.0000 mg | ORAL_TABLET | Freq: Two times a day (BID) | ORAL | 0 refills | Status: DC

## 2017-01-14 LAB — URINE CULTURE

## 2017-01-16 ENCOUNTER — Other Ambulatory Visit: Payer: Self-pay | Admitting: Family

## 2017-01-16 MED ORDER — NITROFURANTOIN MONOHYD MACRO 100 MG PO CAPS: 100.0000 mg | ORAL_CAPSULE | Freq: Two times a day (BID) | ORAL | 0 refills | Status: DC

## 2017-01-25 ENCOUNTER — Telehealth: Payer: Self-pay | Admitting: Family

## 2017-01-25 ENCOUNTER — Other Ambulatory Visit: Payer: Medicare Other

## 2017-01-25 DIAGNOSIS — Z87440 Personal history of urinary (tract) infections: Secondary | ICD-10-CM

## 2017-01-25 LAB — MICROSCOPIC EXAMINATION
Bacteria, UA: NONE SEEN
Epithelial Cells (non renal): NONE SEEN /hpf (ref 0–10)
RBC, UA: NONE SEEN /hpf (ref 0–?)
Renal Epithel, UA: NONE SEEN /hpf
WBC, UA: NONE SEEN /hpf (ref 0–?)

## 2017-01-25 LAB — URINALYSIS, COMPLETE
BILIRUBIN UA: NEGATIVE
GLUCOSE, UA: NEGATIVE
KETONES UA: NEGATIVE
LEUKOCYTES UA: NEGATIVE
Nitrite, UA: NEGATIVE
PROTEIN UA: NEGATIVE
RBC UA: NEGATIVE
SPEC GRAV UA: 1.02 (ref 1.005–1.030)
Urobilinogen, Ur: 0.2 mg/dL (ref 0.2–1.0)
pH, UA: 7 (ref 5.0–7.5)

## 2017-01-25 NOTE — Telephone Encounter (Signed)
Please advice  

## 2017-02-02 ENCOUNTER — Other Ambulatory Visit: Payer: Self-pay | Admitting: Family

## 2017-02-02 DIAGNOSIS — N3281 Overactive bladder: Secondary | ICD-10-CM

## 2017-02-02 DIAGNOSIS — G6289 Other specified polyneuropathies: Secondary | ICD-10-CM

## 2017-02-03 ENCOUNTER — Other Ambulatory Visit: Payer: Self-pay | Admitting: Family

## 2017-02-03 DIAGNOSIS — N3281 Overactive bladder: Secondary | ICD-10-CM

## 2017-02-03 DIAGNOSIS — G6289 Other specified polyneuropathies: Secondary | ICD-10-CM

## 2017-02-21 ENCOUNTER — Ambulatory Visit (INDEPENDENT_AMBULATORY_CARE_PROVIDER_SITE_OTHER): Payer: Medicare Other | Admitting: Family

## 2017-02-21 ENCOUNTER — Encounter: Payer: Self-pay | Admitting: Family

## 2017-02-21 VITALS — BP 142/60 | HR 63 | Temp 97.6°F | Ht 60.0 in | Wt 125.0 lb

## 2017-02-21 DIAGNOSIS — F339 Major depressive disorder, recurrent, unspecified: Secondary | ICD-10-CM | POA: Diagnosis not present

## 2017-02-21 DIAGNOSIS — G6289 Other specified polyneuropathies: Secondary | ICD-10-CM | POA: Diagnosis not present

## 2017-02-21 DIAGNOSIS — F411 Generalized anxiety disorder: Secondary | ICD-10-CM

## 2017-02-21 DIAGNOSIS — J45909 Unspecified asthma, uncomplicated: Secondary | ICD-10-CM

## 2017-02-21 DIAGNOSIS — I10 Essential (primary) hypertension: Secondary | ICD-10-CM | POA: Diagnosis not present

## 2017-02-21 DIAGNOSIS — G894 Chronic pain syndrome: Secondary | ICD-10-CM | POA: Diagnosis not present

## 2017-02-21 DIAGNOSIS — Z0289 Encounter for other administrative examinations: Secondary | ICD-10-CM

## 2017-02-21 DIAGNOSIS — N3281 Overactive bladder: Secondary | ICD-10-CM | POA: Diagnosis not present

## 2017-02-21 MED ORDER — HYDROCODONE-ACETAMINOPHEN 5-325 MG PO TABS: 1.0000 | ORAL_TABLET | Freq: Two times a day (BID) | ORAL | 0 refills | Status: DC | PRN

## 2017-02-21 MED ORDER — SERTRALINE HCL 50 MG PO TABS: 50.0000 mg | ORAL_TABLET | Freq: Every day | ORAL | 2 refills | Status: DC

## 2017-02-21 MED ORDER — GABAPENTIN 800 MG PO TABS: ORAL_TABLET | ORAL | 3 refills | Status: DC

## 2017-02-21 MED ORDER — MONTELUKAST SODIUM 10 MG PO TABS: 10.0000 mg | ORAL_TABLET | Freq: Every day | ORAL | 3 refills | Status: AC

## 2017-02-21 MED ORDER — NITROFURANTOIN MACROCRYSTAL 50 MG PO CAPS: 50.0000 mg | ORAL_CAPSULE | Freq: Every day | ORAL | 1 refills | Status: DC

## 2017-02-21 MED ORDER — OXYBUTYNIN CHLORIDE 5 MG PO TABS: ORAL_TABLET | ORAL | 0 refills | Status: DC

## 2017-02-21 NOTE — Patient Instructions (Signed)

## 2017-02-21 NOTE — Progress Notes (Signed)
Subjective:    Patient ID: [name], female    DOB: 07/29/1924, 83 y.o.   MRN: [id number]  PT presents to the office today for chronic follow up.  Asthma  She complains of cough ("some"). There is no wheezing. This is a chronic problem. The current episode started more than 1 year ago. The problem occurs intermittently. The problem has been waxing and waning. The cough is non-productive. Associated symptoms include sneezing. Pertinent negatives include no malaise/fatigue. Her symptoms are not alleviated by ipratropium and leukotriene antagonist. Her past medical history is significant for asthma.  Hypertension  This is a chronic problem. The current episode started more than 1 year ago. The problem has been waxing and waning since onset. The problem is uncontrolled. Associated symptoms include peripheral edema. Pertinent negatives include no malaise/fatigue. Risk factors for coronary artery disease include dyslipidemia, post-menopausal state and sedentary lifestyle. The current treatment provides mild improvement.  Back Pain  This is a chronic problem. The current episode started more than 1 year ago. The problem occurs intermittently. The problem has been waxing and waning since onset. The pain is present in the lumbar spine. The pain is at a severity of 0/10. The patient is experiencing no pain. Pertinent negatives include no bladder incontinence or bowel incontinence. She has tried analgesics for the symptoms. The treatment provided mild relief.  Depression         This is a chronic problem.  The current episode started more than 1 year ago.   The onset quality is gradual.   The problem occurs constantly.  The problem has been resolved since onset.  Associated symptoms include sad.  Associated symptoms include no helplessness and no hopelessness.  Compliance with treatment is good. OAB PT taking oxybutynin 5 mg TID. States this is working well.  Peripheral Neuropathy Pt takes gabapentin  and Norco. States this helps and her pain is 6 out 10.   Review of Systems  Constitutional: Negative for malaise/fatigue.  HENT: Positive for sneezing.   Respiratory: Positive for cough ("some"). Negative for wheezing.   Gastrointestinal: Negative for bowel incontinence.  Genitourinary: Negative for bladder incontinence.  Musculoskeletal: Positive for back pain.  Psychiatric/Behavioral: Positive for depression.  All other systems reviewed and are negative.      Objective:   Physical Exam  Constitutional: She is oriented to person, place, and time. She appears well-developed and well-nourished. No distress.  HENT:  Head: Normocephalic and atraumatic.  Right Ear: External ear normal.  Left Ear: External ear normal.  Nose: Nose normal.  Mouth/Throat: Oropharynx is clear and moist.  Eyes: Pupils are equal, round, and reactive to light.  Cardiovascular: Normal rate, regular rhythm, normal heart sounds and intact distal pulses.   No murmur heard. Pulmonary/Chest: Effort normal and breath sounds normal. No respiratory distress. She has no wheezes.  Abdominal: Soft. Bowel sounds are normal. She exhibits no distension. There is no tenderness.  Musculoskeletal: Normal range of motion. She exhibits no edema or tenderness.  Generalized weakness, in wheelchair  Neurological: She is alert and oriented to person, place, and time.  Skin: Skin is warm and dry.  Psychiatric: She has a normal mood and affect. Her behavior is normal. Judgment and thought content normal.  Vitals reviewed.    BP (!) 167/73   Pulse 72   Temp 97.6 F (36.4 C) (Oral)   Ht 5' (1.524 m)   Wt 125 lb (56.7 kg)   BMI 24.41 kg/m  Assessment & Plan:  1. Overactive bladder - CMP14+EGFR - oxybutynin (DITROPAN) 5 MG tablet; TAKE  (1)  TABLET  THREE TIMES DAILY.  Dispense: 90 tablet; Refill: 0  2. Essential hypertension - CMP14+EGFR  3. GAD (generalized anxiety disorder) - CMP14+EGFR - sertraline (ZOLOFT) 50  MG tablet; Take 1 tablet (50 mg total) by mouth daily.  Dispense: 90 tablet; Refill: 2  4. Episode of recurrent major depressive disorder, unspecified depression episode severity (HCC) - CMP14+EGFR - sertraline (ZOLOFT) 50 MG tablet; Take 1 tablet (50 mg total) by mouth daily.  Dispense: 90 tablet; Refill: 2  5. Uncomplicated asthma, unspecified asthma severity, unspecified whether persistent - CMP14+EGFR - montelukast (SINGULAIR) 10 MG tablet; Take 1 tablet (10 mg total) by mouth at bedtime.  Dispense: 90 tablet; Refill: 3  6. Other polyneuropathy - CMP14+EGFR - HYDROcodone-acetaminophen (NORCO/VICODIN) 5-325 MG tablet; Take 1 tablet by mouth every 12 (twelve) hours as needed for moderate pain.  Dispense: 60 tablet; Refill: 0 - HYDROcodone-acetaminophen (NORCO/VICODIN) 5-325 MG tablet; Take 1 tablet by mouth every 12 (twelve) hours as needed for moderate pain.  Dispense: 60 tablet; Refill: 0 - HYDROcodone-acetaminophen (NORCO/VICODIN) 5-325 MG tablet; Take 1 tablet by mouth every 12 (twelve) hours as needed for moderate pain.  Dispense: 60 tablet; Refill: 0 - gabapentin (NEURONTIN) 800 MG tablet; TAKE  (1)  TABLET  THREE TIMES DAILY.  Dispense: 90 tablet; Refill: 3  7. Pain medication agreement signed - CMP14+EGFR - HYDROcodone-acetaminophen (NORCO/VICODIN) 5-325 MG tablet; Take 1 tablet by mouth every 12 (twelve) hours as needed for moderate pain.  Dispense: 60 tablet; Refill: 0 - HYDROcodone-acetaminophen (NORCO/VICODIN) 5-325 MG tablet; Take 1 tablet by mouth every 12 (twelve) hours as needed for moderate pain.  Dispense: 60 tablet; Refill: 0 - HYDROcodone-acetaminophen (NORCO/VICODIN) 5-325 MG tablet; Take 1 tablet by mouth every 12 (twelve) hours as needed for moderate pain.  Dispense: 60 tablet; Refill: 0  8. Chronic pain syndrome - CMP14+EGFR - HYDROcodone-acetaminophen (NORCO/VICODIN) 5-325 MG tablet; Take 1 tablet by mouth every 12 (twelve) hours as needed for moderate pain.   Dispense: 60 tablet; Refill: 0 - HYDROcodone-acetaminophen (NORCO/VICODIN) 5-325 MG tablet; Take 1 tablet by mouth every 12 (twelve) hours as needed for moderate pain.  Dispense: 60 tablet; Refill: 0 - HYDROcodone-acetaminophen (NORCO/VICODIN) 5-325 MG tablet; Take 1 tablet by mouth every 12 (twelve) hours as needed for moderate pain.  Dispense: 60 tablet; Refill: 0   Continue all meds Labs pending Health Maintenance reviewed Diet and exercise encouraged RTO 3 months   Pt attempted to be reviewed in [place] Controlled database-Pt not in database- Have done this before and called pharmacy and pharmacy has pt's DOB listed wrong at pharmacy.   [name], [name]

## 2017-02-22 LAB — CMP14+EGFR
ALT: 15 IU/L (ref 0–32)
AST: 23 IU/L (ref 0–40)
Albumin/Globulin Ratio: 2 (ref 1.2–2.2)
Albumin: 4.1 g/dL (ref 3.5–4.7)
Alkaline Phosphatase: 76 IU/L (ref 39–117)
BUN/Creatinine Ratio: 18 (ref 12–28)
BUN: 22 mg/dL (ref 8–27)
Bilirubin Total: 0.2 mg/dL (ref 0.0–1.2)
CO2: 26 mmol/L (ref 20–29)
CREATININE: 1.25 mg/dL — AB (ref 0.57–1.00)
Calcium: 9.2 mg/dL (ref 8.7–10.3)
Chloride: 99 mmol/L (ref 96–106)
GFR, EST AFRICAN AMERICAN: 44 mL/min/{1.73_m2} — AB (ref >59)
GFR, EST NON AFRICAN AMERICAN: 38 mL/min/{1.73_m2} — AB (ref >59)
GLOBULIN, TOTAL: 2.1 g/dL (ref 1.5–4.5)
GLUCOSE: 89 mg/dL (ref 65–99)
Potassium: 4 mmol/L (ref 3.5–5.2)
SODIUM: 139 mmol/L (ref 134–144)
TOTAL PROTEIN: 6.2 g/dL (ref 6.0–8.5)

## 2017-03-24 ENCOUNTER — Ambulatory Visit (INDEPENDENT_AMBULATORY_CARE_PROVIDER_SITE_OTHER): Payer: Medicare Other | Admitting: Family Medicine

## 2017-03-24 DIAGNOSIS — R296 Repeated falls: Secondary | ICD-10-CM

## 2017-03-24 DIAGNOSIS — M48062 Spinal stenosis, lumbar region with neurogenic claudication: Secondary | ICD-10-CM

## 2017-03-24 DIAGNOSIS — I10 Essential (primary) hypertension: Secondary | ICD-10-CM

## 2017-03-24 DIAGNOSIS — G894 Chronic pain syndrome: Secondary | ICD-10-CM

## 2017-03-30 ENCOUNTER — Other Ambulatory Visit: Payer: Self-pay | Admitting: Family

## 2017-03-30 DIAGNOSIS — N3281 Overactive bladder: Secondary | ICD-10-CM

## 2017-05-03 ENCOUNTER — Other Ambulatory Visit: Payer: Self-pay | Admitting: Family

## 2017-05-04 NOTE — Telephone Encounter (Signed)
Last seen 02/21/17  [facility]

## 2017-05-08 ENCOUNTER — Ambulatory Visit (INDEPENDENT_AMBULATORY_CARE_PROVIDER_SITE_OTHER): Payer: Medicare Other | Admitting: Family

## 2017-05-08 ENCOUNTER — Encounter: Payer: Self-pay | Admitting: Family

## 2017-05-08 VITALS — BP 170/74 | HR 62 | Temp 97.1°F | Ht 60.0 in | Wt 125.0 lb

## 2017-05-08 DIAGNOSIS — B373 Candidiasis of vulva and vagina: Secondary | ICD-10-CM | POA: Diagnosis not present

## 2017-05-08 DIAGNOSIS — G894 Chronic pain syndrome: Secondary | ICD-10-CM

## 2017-05-08 DIAGNOSIS — F411 Generalized anxiety disorder: Secondary | ICD-10-CM

## 2017-05-08 DIAGNOSIS — R410 Disorientation, unspecified: Secondary | ICD-10-CM

## 2017-05-08 DIAGNOSIS — B3731 Acute candidiasis of vulva and vagina: Secondary | ICD-10-CM

## 2017-05-08 DIAGNOSIS — Z0289 Encounter for other administrative examinations: Secondary | ICD-10-CM

## 2017-05-08 DIAGNOSIS — N3001 Acute cystitis with hematuria: Secondary | ICD-10-CM

## 2017-05-08 DIAGNOSIS — G6289 Other specified polyneuropathies: Secondary | ICD-10-CM | POA: Diagnosis not present

## 2017-05-08 LAB — URINALYSIS, COMPLETE
BILIRUBIN UA: NEGATIVE
Glucose, UA: NEGATIVE
KETONES UA: NEGATIVE
LEUKOCYTES UA: NEGATIVE
Nitrite, UA: NEGATIVE
SPEC GRAV UA: 1.02 (ref 1.005–1.030)
Urobilinogen, Ur: 0.2 mg/dL (ref 0.2–1.0)
pH, UA: 6 (ref 5.0–7.5)

## 2017-05-08 LAB — MICROSCOPIC EXAMINATION: RENAL EPITHEL UA: NONE SEEN /HPF

## 2017-05-08 MED ORDER — HYDROCODONE-ACETAMINOPHEN 5-325 MG PO TABS: 1.0000 | ORAL_TABLET | Freq: Two times a day (BID) | ORAL | 0 refills | Status: DC | PRN

## 2017-05-08 MED ORDER — FLUCONAZOLE 150 MG PO TABS: 150.0000 mg | ORAL_TABLET | ORAL | 0 refills | Status: DC | PRN

## 2017-05-08 MED ORDER — SULFAMETHOXAZOLE-TRIMETHOPRIM 800-160 MG PO TABS: 1.0000 | ORAL_TABLET | Freq: Two times a day (BID) | ORAL | 0 refills | Status: DC

## 2017-05-08 MED ORDER — LORAZEPAM 0.5 MG PO TABS: 0.2500 mg | ORAL_TABLET | Freq: Two times a day (BID) | ORAL | 1 refills | Status: DC | PRN

## 2017-05-08 NOTE — Progress Notes (Signed)
Subjective:    Patient ID: [name], female    DOB: 1925/02/24, 83 y.o.   MRN: [id number]  Altered Mental Status  This is a new problem. The current episode started in the past 7 days. The problem occurs constantly. The problem has been waxing and waning. Pertinent negatives include no nausea or vomiting.  Urinary Frequency   This is a new problem. The current episode started in the past 7 days. The problem occurs intermittently. The problem has been unchanged. The patient is experiencing no pain. Associated symptoms include frequency and urgency. Pertinent negatives include no nausea or vomiting. She has tried increased fluids for the symptoms. The treatment provided mild relief.  Back Pain  This is a chronic problem. The current episode started more than 1 year ago. The problem occurs intermittently. The problem has been waxing and waning since onset. The pain is present in the lumbar spine. The quality of the pain is described as aching. The pain is at a severity of 7/10. The pain is moderate. The symptoms are aggravated by coughing. She has tried analgesics and bed rest for the symptoms. The treatment provided mild relief.  Peripheral Neuropathy PT complains of constant burning pain of 5 out 10. Norco helps.     Review of Systems  Gastrointestinal: Negative for nausea and vomiting.  Genitourinary: Positive for frequency and urgency.  Musculoskeletal: Positive for back pain.  All other systems reviewed and are negative.      Objective:   Physical Exam  Constitutional: She is oriented to person, place, and time. She appears well-developed and well-nourished. No distress.  HENT:  Head: Normocephalic.  Eyes: Pupils are equal, round, and reactive to light.  Neck: Normal range of motion. Neck supple. No thyromegaly present.  Cardiovascular: Normal rate, regular rhythm, normal heart sounds and intact distal pulses.  No murmur heard. Pulmonary/Chest: Effort normal and breath  sounds normal. No respiratory distress. She has no wheezes.  Abdominal: Soft. Bowel sounds are normal. She exhibits no distension. There is no tenderness.  Musculoskeletal: Normal range of motion. She exhibits no edema or tenderness.  Generalized weakness, pt in wheelchair   Neurological: She is alert and oriented to person, place, and time.  Skin: Skin is warm and dry.  Psychiatric: She has a normal mood and affect. Her behavior is normal. Judgment and thought content normal.  Vitals reviewed.   BP (!) 170/74   Pulse 62   Temp (!) 97.1 F (36.2 C) (Oral)   Ht 5' (1.524 m)   Wt 125 lb (56.7 kg)   BMI 24.41 kg/m      Assessment & Plan:  1. Confusion - Urinalysis, Complete - Urine Culture  2. Acute cystitis with hematuria Force fluids RTO prn Culture pending - sulfamethoxazole-trimethoprim (BACTRIM DS) 800-160 MG tablet; Take 1 tablet 2 (two) times daily by mouth.  Dispense: 14 tablet; Refill: 0  3. Vagina, candidiasis Keep clean and dry - fluconazole (DIFLUCAN) 150 MG tablet; Take 1 tablet (150 mg total) every three (3) days as needed by mouth.  Dispense: 3 tablet; Refill: 0  4. Other polyneuropathy - HYDROcodone-acetaminophen (NORCO/VICODIN) 5-325 MG tablet; Take 1 tablet every 12 (twelve) hours as needed by mouth for moderate pain.  Dispense: 60 tablet; Refill: 0 - HYDROcodone-acetaminophen (NORCO/VICODIN) 5-325 MG tablet; Take 1 tablet every 12 (twelve) hours as needed by mouth for moderate pain.  Dispense: 60 tablet; Refill: 0 - HYDROcodone-acetaminophen (NORCO/VICODIN) 5-325 MG tablet; Take 1 tablet every 12 (twelve) hours as  needed by mouth for moderate pain.  Dispense: 60 tablet; Refill: 0  5. Pain medication agreement signed - HYDROcodone-acetaminophen (NORCO/VICODIN) 5-325 MG tablet; Take 1 tablet every 12 (twelve) hours as needed by mouth for moderate pain.  Dispense: 60 tablet; Refill: 0 - HYDROcodone-acetaminophen (NORCO/VICODIN) 5-325 MG tablet; Take 1 tablet  every 12 (twelve) hours as needed by mouth for moderate pain.  Dispense: 60 tablet; Refill: 0 - HYDROcodone-acetaminophen (NORCO/VICODIN) 5-325 MG tablet; Take 1 tablet every 12 (twelve) hours as needed by mouth for moderate pain.  Dispense: 60 tablet; Refill: 0  6. Chronic pain syndrome - HYDROcodone-acetaminophen (NORCO/VICODIN) 5-325 MG tablet; Take 1 tablet every 12 (twelve) hours as needed by mouth for moderate pain.  Dispense: 60 tablet; Refill: 0 - HYDROcodone-acetaminophen (NORCO/VICODIN) 5-325 MG tablet; Take 1 tablet every 12 (twelve) hours as needed by mouth for moderate pain.  Dispense: 60 tablet; Refill: 0 - HYDROcodone-acetaminophen (NORCO/VICODIN) 5-325 MG tablet; Take 1 tablet every 12 (twelve) hours as needed by mouth for moderate pain.  Dispense: 60 tablet; Refill: 0  7. GAD (generalized anxiety disorder) - LORazepam (ATIVAN) 0.5 MG tablet; Take 0.5 tablets (0.25 mg total) every 12 (twelve) hours as needed by mouth for anxiety.  Dispense: 45 tablet; Refill: 1  Pt has only received controlled medications from me.    [name], [name]

## 2017-05-08 NOTE — Patient Instructions (Signed)

## 2017-05-10 ENCOUNTER — Other Ambulatory Visit: Payer: Self-pay | Admitting: Family

## 2017-05-10 LAB — URINE CULTURE

## 2017-05-10 MED ORDER — DOXYCYCLINE HYCLATE 100 MG PO TABS: 100.0000 mg | ORAL_TABLET | Freq: Two times a day (BID) | ORAL | 0 refills | Status: DC

## 2017-05-30 ENCOUNTER — Other Ambulatory Visit: Payer: Self-pay | Admitting: Family

## 2017-06-15 ENCOUNTER — Ambulatory Visit (INDEPENDENT_AMBULATORY_CARE_PROVIDER_SITE_OTHER): Payer: Medicare Other

## 2017-06-15 DIAGNOSIS — G894 Chronic pain syndrome: Secondary | ICD-10-CM | POA: Diagnosis not present

## 2017-06-15 DIAGNOSIS — I10 Essential (primary) hypertension: Secondary | ICD-10-CM | POA: Diagnosis not present

## 2017-06-15 DIAGNOSIS — M48062 Spinal stenosis, lumbar region with neurogenic claudication: Secondary | ICD-10-CM

## 2017-06-15 DIAGNOSIS — R296 Repeated falls: Secondary | ICD-10-CM

## 2017-06-29 ENCOUNTER — Other Ambulatory Visit: Payer: Self-pay | Admitting: Family

## 2017-06-29 ENCOUNTER — Other Ambulatory Visit: Payer: Self-pay | Admitting: Family Medicine

## 2017-06-29 DIAGNOSIS — G6289 Other specified polyneuropathies: Secondary | ICD-10-CM

## 2017-06-30 NOTE — Telephone Encounter (Signed)
Last seen 05/08/17  [facility]

## 2017-07-21 ENCOUNTER — Telehealth: Payer: Self-pay | Admitting: Family

## 2017-07-21 ENCOUNTER — Other Ambulatory Visit: Payer: Medicare Other

## 2017-07-21 ENCOUNTER — Other Ambulatory Visit: Payer: Self-pay | Admitting: Family

## 2017-07-21 DIAGNOSIS — R4182 Altered mental status, unspecified: Secondary | ICD-10-CM

## 2017-07-21 NOTE — Telephone Encounter (Signed)
Aware- Urine cup and hat placed up front.

## 2017-07-21 NOTE — Telephone Encounter (Signed)
Yes patient can bring a urine. I will place orders

## 2017-07-21 NOTE — Telephone Encounter (Signed)
Daughter states that when she gets a UTI she starts "talking out of her head ". States that she has been very confused and talking out of her head x 3-4 days. Wanting to know if they can bring a urine in to be checked? Please advise

## 2017-07-23 LAB — URINE CULTURE

## 2017-07-24 LAB — URINALYSIS, COMPLETE
BILIRUBIN UA: NEGATIVE
GLUCOSE, UA: NEGATIVE
KETONES UA: NEGATIVE
Leukocytes, UA: NEGATIVE
NITRITE UA: NEGATIVE
PROTEIN UA: NEGATIVE
RBC, UA: NEGATIVE
SPEC GRAV UA: 1.02 (ref 1.005–1.030)
UUROB: 0.2 mg/dL (ref 0.2–1.0)
pH, UA: 6.5 (ref 5.0–7.5)

## 2017-07-24 LAB — MICROSCOPIC EXAMINATION
RBC, UA: NONE SEEN /hpf (ref 0–?)
Renal Epithel, UA: NONE SEEN /hpf

## 2017-08-07 ENCOUNTER — Other Ambulatory Visit: Payer: Self-pay | Admitting: Family

## 2017-08-07 ENCOUNTER — Other Ambulatory Visit: Payer: Self-pay | Admitting: Family Medicine

## 2017-08-07 DIAGNOSIS — G6289 Other specified polyneuropathies: Secondary | ICD-10-CM

## 2017-08-10 NOTE — Telephone Encounter (Signed)
Deferring to you 

## 2017-08-15 ENCOUNTER — Encounter: Payer: Self-pay | Admitting: Family

## 2017-08-15 ENCOUNTER — Ambulatory Visit (INDEPENDENT_AMBULATORY_CARE_PROVIDER_SITE_OTHER): Payer: Medicare Other | Admitting: Family

## 2017-08-15 VITALS — BP 127/66 | HR 67 | Temp 98.7°F | Ht <= 58 in

## 2017-08-15 DIAGNOSIS — F411 Generalized anxiety disorder: Secondary | ICD-10-CM | POA: Diagnosis not present

## 2017-08-15 DIAGNOSIS — F339 Major depressive disorder, recurrent, unspecified: Secondary | ICD-10-CM | POA: Diagnosis not present

## 2017-08-15 DIAGNOSIS — G894 Chronic pain syndrome: Secondary | ICD-10-CM | POA: Diagnosis not present

## 2017-08-15 DIAGNOSIS — N3281 Overactive bladder: Secondary | ICD-10-CM | POA: Diagnosis not present

## 2017-08-15 DIAGNOSIS — Z0289 Encounter for other administrative examinations: Secondary | ICD-10-CM

## 2017-08-15 DIAGNOSIS — F1120 Opioid dependence, uncomplicated: Secondary | ICD-10-CM

## 2017-08-15 DIAGNOSIS — I10 Essential (primary) hypertension: Secondary | ICD-10-CM | POA: Diagnosis not present

## 2017-08-15 DIAGNOSIS — G6289 Other specified polyneuropathies: Secondary | ICD-10-CM

## 2017-08-15 DIAGNOSIS — J45909 Unspecified asthma, uncomplicated: Secondary | ICD-10-CM | POA: Diagnosis not present

## 2017-08-15 MED ORDER — OXYBUTYNIN CHLORIDE 5 MG PO TABS: ORAL_TABLET | ORAL | 2 refills | Status: DC

## 2017-08-15 MED ORDER — HYDROCODONE-ACETAMINOPHEN 5-325 MG PO TABS: 1.0000 | ORAL_TABLET | Freq: Two times a day (BID) | ORAL | 0 refills | Status: DC | PRN

## 2017-08-15 MED ORDER — LORAZEPAM 0.5 MG PO TABS: 0.2500 mg | ORAL_TABLET | Freq: Two times a day (BID) | ORAL | 2 refills | Status: DC | PRN

## 2017-08-15 NOTE — Patient Instructions (Signed)
Neuropathic Pain Neuropathic pain is pain caused by damage to the nerves that are responsible for certain sensations in your body (sensory nerves). The pain can be caused by damage to:  The sensory nerves that send signals to your spinal cord and brain (peripheral nervous system).  The sensory nerves in your brain or spinal cord (central nervous system).  Neuropathic pain can make you more sensitive to pain. What would be a minor sensation for most people may feel very painful if you have neuropathic pain. This is usually a long-term condition that can be difficult to treat. The type of pain can differ from person to person. It may start suddenly (acute), or it may develop slowly and last for a long time (chronic). Neuropathic pain may come and go as damaged nerves heal or may stay at the same level for years. It often causes emotional distress, loss of sleep, and a lower quality of life. What are the causes? The most common cause of damage to a sensory nerve is diabetes. Many other diseases and conditions can also cause neuropathic pain. Causes of neuropathic pain can be classified as:  Toxic. Many drugs and chemicals can cause toxic damage. The most common cause of toxic neuropathic pain is damage from drug treatment for cancer (chemotherapy).  Metabolic. This type of pain can happen when a disease causes imbalances that damage nerves. Diabetes is the most common of these diseases. Vitamin B deficiency caused by long-term alcohol abuse is another common cause.  Traumatic. Any injury that cuts, crushes, or stretches a nerve can cause damage and pain. A common example is feeling pain after losing an arm or leg (phantom limb pain).  Compression-related. If a sensory nerve gets trapped or compressed for a long period of time, the blood supply to the nerve can be cut off.  Vascular. Many blood vessel diseases can cause neuropathic pain by decreasing blood supply and oxygen to nerves.  Autoimmune.  This type of pain results from diseases in which the body's defense system mistakenly attacks sensory nerves. Examples of autoimmune diseases that can cause neuropathic pain include lupus and multiple sclerosis.  Infectious. Many types of viral infections can damage sensory nerves and cause pain. Shingles infection is a common cause of this type of pain.  Inherited. Neuropathic pain can be a symptom of many diseases that are passed down through families (genetic).  What are the signs or symptoms? The main symptom is pain. Neuropathic pain is often described as:  Burning.  Shock-like.  Stinging.  Hot or cold.  Itching.  How is this diagnosed? No single test can diagnose neuropathic pain. Your health care provider will do a physical exam and ask you about your pain. You may use a pain scale to describe how bad your pain is. You may also have tests to see if you have a high sensitivity to pain and to help find the cause and location of any sensory nerve damage. These tests may include:  Imaging studies, such as: ? X-rays. ? CT scan. ? MRI.  Nerve conduction studies to test how well nerve signals travel through your sensory nerves (electrodiagnostic testing).  Stimulating your sensory nerves through electrodes on your skin and measuring the response in your spinal cord and brain (somatosensory evoked potentials).  How is this treated? Treatment for neuropathic pain may change over time. You may need to try different treatment options or a combination of treatments. Some options include:  Over-the-counter pain relievers.  Prescription medicines. Some medicines   used to treat other conditions may also help neuropathic pain. These include medicines to: ? Control seizures (anticonvulsants). ? Relieve depression (antidepressants).  Prescription-strength pain relievers (narcotics). These are usually used when other pain relievers do not help.  Transcutaneous nerve stimulation (TENS).  This uses electrical currents to block painful nerve signals. The treatment is painless.  Topical and local anesthetics. These are medicines that numb the nerves. They can be injected as a nerve block or applied to the skin.  Alternative treatments, such as: ? Acupuncture. ? Meditation. ? Massage. ? Physical therapy. ? Pain management programs. ? Counseling.  Follow these instructions at home:  Learn as much as you can about your condition.  Take medicines only as directed by your health care provider.  Work closely with all your health care providers to find what works best for you.  Have a good support system at home.  Consider joining a chronic pain support group. Contact a health care provider if:  Your pain treatments are not helping.  You are having side effects from your medicines.  You are struggling with fatigue, mood changes, depression, or anxiety. This information is not intended to replace advice given to you by your health care provider. Make sure you discuss any questions you have with your health care provider. Document Released: 03/10/2004 Document Revised: 01/01/2016 Document Reviewed: 11/21/2013 Elsevier Interactive Patient Education  2018 [employer]  

## 2017-08-15 NOTE — Progress Notes (Signed)
Subjective:    Patient ID: [name], female    DOB: March 12, 1925, 84 y.o.   MRN: [id number]  PT presents to the office today for chronic follow up.  Anxiety  Presents for follow-up visit. Symptoms include depressed mood, excessive worry, irritability, nervous/anxious behavior, restlessness and shortness of breath. Symptoms occur most days. The severity of symptoms is moderate.    Hypertension  This is a chronic problem. The current episode started more than 1 year ago. The problem has been resolved since onset. The problem is controlled. Associated symptoms include anxiety, malaise/fatigue and shortness of breath. Pertinent negatives include no peripheral edema. The current treatment provides moderate improvement. There is no history of CAD/MI or heart failure.  Arthritis  Presents for follow-up visit. She complains of pain and stiffness. The symptoms have been stable. Her pain is at a severity of 7/10.  Peripheral Neuropathy  PT has bilateral leg pain that "varies and depends on what I'm doing".     Review of Systems  Constitutional: Positive for irritability and malaise/fatigue.  Respiratory: Positive for shortness of breath.   Musculoskeletal: Positive for arthritis and stiffness.  Psychiatric/Behavioral: The patient is nervous/anxious.   All other systems reviewed and are negative.      Objective:   Physical Exam  Constitutional: She is oriented to person, place, and time. She appears well-developed and well-nourished. No distress.  HENT:  Head: Normocephalic and atraumatic.  Mouth/Throat: Oropharynx is clear and moist.  Bilateral ear with cerumen impaction, curette used and cleared, TM WNL  Eyes: Pupils are equal, round, and reactive to light.  Neck: Normal range of motion. Neck supple. No thyromegaly present.  Cardiovascular: Normal rate, regular rhythm, normal heart sounds and intact distal pulses.  No murmur heard. Pulmonary/Chest: Effort normal and breath  sounds normal. No respiratory distress. She has no wheezes.  Abdominal: Soft. Bowel sounds are normal. She exhibits no distension. There is no tenderness.  Musculoskeletal: She exhibits no edema or tenderness.  Generalized weakness, pt in wheelchair   Neurological: She is alert and oriented to person, place, and time.  Skin: Skin is warm and dry.  Psychiatric: She has a normal mood and affect. Her behavior is normal. Judgment and thought content normal.  Vitals reviewed.     BP 127/66   Pulse 67   Temp 98.7 F (37.1 C) (Oral)   Ht _0  (1.473 m)   BMI 26.13 kg/m      Assessment & Plan:  1. Overactive bladder - oxybutynin (DITROPAN) 5 MG tablet; TAKE (1) TABLET THREE TIMES DAILY.  Dispense: 90 tablet; Refill: 2 - CMP14+EGFR  2. GAD (generalized anxiety disorder) - LORazepam (ATIVAN) 0.5 MG tablet; Take 0.5 tablets (0.25 mg total) by mouth every 12 (twelve) hours as needed for anxiety.  Dispense: 45 tablet; Refill: 2 - CMP14+EGFR  3. Essential hypertension - CMP14+EGFR  4. Uncomplicated asthma, unspecified asthma severity, unspecified whether persistent - CMP14+EGFR  5. Uncomplicated opioid dependence ([place])  - CMP14+EGFR  6. Episode of recurrent major depressive disorder, unspecified depression episode severity ([place]) - CMP14+EGFR  7. Other polyneuropathy - HYDROcodone-acetaminophen (NORCO/VICODIN) 5-325 MG tablet; Take 1 tablet by mouth every 12 (twelve) hours as needed for moderate pain.  Dispense: 60 tablet; Refill: 0 - HYDROcodone-acetaminophen (NORCO/VICODIN) 5-325 MG tablet; Take 1 tablet by mouth every 12 (twelve) hours as needed for moderate pain.  Dispense: 60 tablet; Refill: 0 - HYDROcodone-acetaminophen (NORCO/VICODIN) 5-325 MG tablet; Take 1 tablet by mouth every 12 (twelve) hours as needed  for moderate pain.  Dispense: 60 tablet; Refill: 0 - CMP14+EGFR  8. Pain medication agreement signed - HYDROcodone-acetaminophen (NORCO/VICODIN) 5-325 MG tablet; Take  1 tablet by mouth every 12 (twelve) hours as needed for moderate pain.  Dispense: 60 tablet; Refill: 0 - HYDROcodone-acetaminophen (NORCO/VICODIN) 5-325 MG tablet; Take 1 tablet by mouth every 12 (twelve) hours as needed for moderate pain.  Dispense: 60 tablet; Refill: 0 - HYDROcodone-acetaminophen (NORCO/VICODIN) 5-325 MG tablet; Take 1 tablet by mouth every 12 (twelve) hours as needed for moderate pain.  Dispense: 60 tablet; Refill: 0 - CMP14+EGFR  9. Chronic pain syndrome - HYDROcodone-acetaminophen (NORCO/VICODIN) 5-325 MG tablet; Take 1 tablet by mouth every 12 (twelve) hours as needed for moderate pain.  Dispense: 60 tablet; Refill: 0 - HYDROcodone-acetaminophen (NORCO/VICODIN) 5-325 MG tablet; Take 1 tablet by mouth every 12 (twelve) hours as needed for moderate pain.  Dispense: 60 tablet; Refill: 0 - HYDROcodone-acetaminophen (NORCO/VICODIN) 5-325 MG tablet; Take 1 tablet by mouth every 12 (twelve) hours as needed for moderate pain.  Dispense: 60 tablet; Refill: 0 - CMP14+EGFR   Continue all meds Labs pending Health Maintenance reviewed Diet and exercise encouraged RTO 3 months   [name], [name]

## 2017-08-16 LAB — CMP14+EGFR
A/G RATIO: 2 (ref 1.2–2.2)
ALBUMIN: 4.2 g/dL (ref 3.2–4.6)
ALK PHOS: 81 IU/L (ref 39–117)
ALT: 13 IU/L (ref 0–32)
AST: 17 IU/L (ref 0–40)
BILIRUBIN TOTAL: 0.2 mg/dL (ref 0.0–1.2)
BUN / CREAT RATIO: 20 (ref 12–28)
BUN: 26 mg/dL (ref 10–36)
CHLORIDE: 106 mmol/L (ref 96–106)
CO2: 25 mmol/L (ref 20–29)
CREATININE: 1.28 mg/dL — AB (ref 0.57–1.00)
Calcium: 9.1 mg/dL (ref 8.7–10.3)
GFR calc Af Amer: 43 mL/min/{1.73_m2} — ABNORMAL LOW (ref >59)
GFR calc non Af Amer: 37 mL/min/{1.73_m2} — ABNORMAL LOW (ref >59)
GLOBULIN, TOTAL: 2.1 g/dL (ref 1.5–4.5)
Glucose: 84 mg/dL (ref 65–99)
POTASSIUM: 4.5 mmol/L (ref 3.5–5.2)
SODIUM: 146 mmol/L — AB (ref 134–144)
Total Protein: 6.3 g/dL (ref 6.0–8.5)

## 2017-08-22 ENCOUNTER — Telehealth: Payer: Self-pay | Admitting: Family

## 2017-08-22 MED ORDER — ONDANSETRON 4 MG PO TBDP: 4.0000 mg | ORAL_TABLET | Freq: Three times a day (TID) | ORAL | 0 refills | Status: DC | PRN

## 2017-08-22 NOTE — Telephone Encounter (Signed)
Zofran Prescription sent to pharmacy. Force fluids, bland diet

## 2017-08-22 NOTE — Telephone Encounter (Signed)
Patient aware that Rx for Zofran has been sent to pharmacy

## 2017-08-22 NOTE — Telephone Encounter (Signed)
Please advise, patient last seen 08/15/17.

## 2017-08-23 ENCOUNTER — Other Ambulatory Visit: Payer: Self-pay

## 2017-08-23 ENCOUNTER — Observation Stay (HOSPITAL_COMMUNITY)
Admission: EM | Admit: 2017-08-23 | Discharge: 2017-08-25 | Disposition: A | Discharge status: Home / Self Care | Payer: Medicare Other | Attending: Internal Medicine | Admitting: Internal Medicine

## 2017-08-23 ENCOUNTER — Emergency Department (HOSPITAL_COMMUNITY): Payer: Medicare Other

## 2017-08-23 ENCOUNTER — Encounter (HOSPITAL_COMMUNITY): Payer: Self-pay

## 2017-08-23 DIAGNOSIS — I129 Hypertensive chronic kidney disease with stage 1 through stage 4 chronic kidney disease, or unspecified chronic kidney disease: Secondary | ICD-10-CM | POA: Insufficient documentation

## 2017-08-23 DIAGNOSIS — E876 Hypokalemia: Secondary | ICD-10-CM

## 2017-08-23 DIAGNOSIS — J45909 Unspecified asthma, uncomplicated: Secondary | ICD-10-CM | POA: Diagnosis present

## 2017-08-23 DIAGNOSIS — G9341 Metabolic encephalopathy: Secondary | ICD-10-CM

## 2017-08-23 DIAGNOSIS — E860 Dehydration: Principal | ICD-10-CM | POA: Diagnosis present

## 2017-08-23 DIAGNOSIS — N183 Chronic kidney disease, stage 3 unspecified: Secondary | ICD-10-CM

## 2017-08-23 DIAGNOSIS — K5790 Diverticulosis of intestine, part unspecified, without perforation or abscess without bleeding: Secondary | ICD-10-CM

## 2017-08-23 DIAGNOSIS — Z79899 Other long term (current) drug therapy: Secondary | ICD-10-CM | POA: Insufficient documentation

## 2017-08-23 DIAGNOSIS — R112 Nausea with vomiting, unspecified: Secondary | ICD-10-CM

## 2017-08-23 DIAGNOSIS — E559 Vitamin D deficiency, unspecified: Secondary | ICD-10-CM | POA: Diagnosis present

## 2017-08-23 DIAGNOSIS — R109 Unspecified abdominal pain: Secondary | ICD-10-CM | POA: Insufficient documentation

## 2017-08-23 DIAGNOSIS — F32A Depression, unspecified: Secondary | ICD-10-CM | POA: Diagnosis present

## 2017-08-23 DIAGNOSIS — G8929 Other chronic pain: Secondary | ICD-10-CM | POA: Diagnosis present

## 2017-08-23 DIAGNOSIS — R197 Diarrhea, unspecified: Secondary | ICD-10-CM | POA: Insufficient documentation

## 2017-08-23 DIAGNOSIS — F1120 Opioid dependence, uncomplicated: Secondary | ICD-10-CM | POA: Diagnosis present

## 2017-08-23 DIAGNOSIS — N3281 Overactive bladder: Secondary | ICD-10-CM | POA: Diagnosis present

## 2017-08-23 DIAGNOSIS — F329 Major depressive disorder, single episode, unspecified: Secondary | ICD-10-CM | POA: Diagnosis present

## 2017-08-23 DIAGNOSIS — A084 Viral intestinal infection, unspecified: Secondary | ICD-10-CM | POA: Diagnosis present

## 2017-08-23 DIAGNOSIS — R55 Syncope and collapse: Secondary | ICD-10-CM

## 2017-08-23 DIAGNOSIS — G40909 Epilepsy, unspecified, not intractable, without status epilepticus: Secondary | ICD-10-CM | POA: Diagnosis not present

## 2017-08-23 DIAGNOSIS — G629 Polyneuropathy, unspecified: Secondary | ICD-10-CM

## 2017-08-23 LAB — URINALYSIS, ROUTINE W REFLEX MICROSCOPIC
Bacteria, UA: NONE SEEN
Bilirubin Urine: NEGATIVE
GLUCOSE, UA: NEGATIVE mg/dL
Hgb urine dipstick: NEGATIVE
KETONES UR: 20 mg/dL — AB
LEUKOCYTES UA: NEGATIVE
Nitrite: NEGATIVE
PH: 5 (ref 5.0–8.0)
Protein, ur: 100 mg/dL — AB
SPECIFIC GRAVITY, URINE: 1.02 (ref 1.005–1.030)

## 2017-08-23 LAB — COMPREHENSIVE METABOLIC PANEL
ALT: 15 U/L (ref 14–54)
ANION GAP: 14 (ref 5–15)
AST: 22 U/L (ref 15–41)
Albumin: 4.3 g/dL (ref 3.5–5.0)
Alkaline Phosphatase: 76 U/L (ref 38–126)
BILIRUBIN TOTAL: 0.9 mg/dL (ref 0.3–1.2)
BUN: 17 mg/dL (ref 6–20)
CALCIUM: 9.6 mg/dL (ref 8.9–10.3)
CO2: 24 mmol/L (ref 22–32)
Chloride: 104 mmol/L (ref 101–111)
Creatinine, Ser: 1.13 mg/dL — ABNORMAL HIGH (ref 0.44–1.00)
GFR, EST AFRICAN AMERICAN: 48 mL/min — AB (ref >60)
GFR, EST NON AFRICAN AMERICAN: 41 mL/min — AB (ref >60)
Glucose, Bld: 103 mg/dL — ABNORMAL HIGH (ref 65–99)
POTASSIUM: 3.3 mmol/L — AB (ref 3.5–5.1)
Sodium: 142 mmol/L (ref 135–145)
TOTAL PROTEIN: 7.2 g/dL (ref 6.5–8.1)

## 2017-08-23 LAB — CBC WITH DIFFERENTIAL/PLATELET
BASOS ABS: 0 10*3/uL (ref 0.0–0.1)
Basophils Relative: 0 %
EOS ABS: 0 10*3/uL (ref 0.0–0.7)
EOS PCT: 0 %
HCT: 40 % (ref 36.0–46.0)
Hemoglobin: 13.4 g/dL (ref 12.0–15.0)
Lymphocytes Relative: 10 %
Lymphs Abs: 0.8 10*3/uL (ref 0.7–4.0)
MCH: 29.1 pg (ref 26.0–34.0)
MCHC: 33.5 g/dL (ref 30.0–36.0)
MCV: 86.8 fL (ref 78.0–100.0)
MONO ABS: 0.5 10*3/uL (ref 0.1–1.0)
Monocytes Relative: 6 %
Neutro Abs: 6.8 10*3/uL (ref 1.7–7.7)
Neutrophils Relative %: 84 %
PLATELETS: 218 10*3/uL (ref 150–400)
RBC: 4.61 MIL/uL (ref 3.87–5.11)
RDW: 13.3 % (ref 11.5–15.5)
WBC: 8.2 10*3/uL (ref 4.0–10.5)

## 2017-08-23 LAB — LACTIC ACID, PLASMA: LACTIC ACID, VENOUS: 0.8 mmol/L (ref 0.5–1.9)

## 2017-08-23 LAB — LIPASE, BLOOD: LIPASE: 23 U/L (ref 11–51)

## 2017-08-23 LAB — TROPONIN I: Troponin I: <0.03 ng/mL (ref <0.03)

## 2017-08-23 MED ORDER — MONTELUKAST SODIUM 10 MG PO TABS
10.0000 mg | ORAL_TABLET | Freq: Every day | ORAL | Status: DC
  Administered 2017-08-23 – 2017-08-24 (×2): 10 mg via ORAL
  Filled 2017-08-23 (×2): qty 1

## 2017-08-23 MED ORDER — SODIUM CHLORIDE 0.9% FLUSH: 3.0000 mL | INTRAVENOUS | Status: DC | PRN

## 2017-08-23 MED ORDER — LORAZEPAM 0.5 MG PO TABS
0.2500 mg | ORAL_TABLET | Freq: Two times a day (BID) | ORAL | Status: DC | PRN
  Administered 2017-08-23: 0.25 mg via ORAL
  Filled 2017-08-23: qty 1

## 2017-08-23 MED ORDER — SODIUM CHLORIDE 0.9 % IV SOLN: 250.0000 mL | INTRAVENOUS | Status: DC | PRN

## 2017-08-23 MED ORDER — ONDANSETRON HCL 4 MG PO TABS: 4.0000 mg | ORAL_TABLET | Freq: Four times a day (QID) | ORAL | Status: DC | PRN

## 2017-08-23 MED ORDER — ONDANSETRON HCL 4 MG/2ML IJ SOLN
4.0000 mg | Freq: Four times a day (QID) | INTRAMUSCULAR | Status: DC | PRN
  Administered 2017-08-23: 4 mg via INTRAVENOUS
  Filled 2017-08-23: qty 2

## 2017-08-23 MED ORDER — GABAPENTIN 400 MG PO CAPS
800.0000 mg | ORAL_CAPSULE | Freq: Three times a day (TID) | ORAL | Status: DC
Start: 2017-08-23 — End: 2017-08-25
  Administered 2017-08-23 – 2017-08-25 (×5): 800 mg via ORAL
  Filled 2017-08-23 (×5): qty 2

## 2017-08-23 MED ORDER — ACETAMINOPHEN 500 MG PO TABS: 500.0000 mg | ORAL_TABLET | Freq: Four times a day (QID) | ORAL | Status: DC | PRN

## 2017-08-23 MED ORDER — HYDROCODONE-ACETAMINOPHEN 5-325 MG PO TABS
1.0000 | ORAL_TABLET | Freq: Two times a day (BID) | ORAL | Status: DC | PRN
  Administered 2017-08-24 – 2017-08-25 (×2): 1 via ORAL
  Filled 2017-08-23 (×3): qty 1

## 2017-08-23 MED ORDER — OXYBUTYNIN CHLORIDE 5 MG PO TABS
5.0000 mg | ORAL_TABLET | Freq: Three times a day (TID) | ORAL | Status: DC
  Administered 2017-08-23 – 2017-08-25 (×5): 5 mg via ORAL
  Filled 2017-08-23 (×5): qty 1

## 2017-08-23 MED ORDER — SODIUM CHLORIDE 0.9% FLUSH
3.0000 mL | Freq: Two times a day (BID) | INTRAVENOUS | Status: DC
  Administered 2017-08-24: 3 mL via INTRAVENOUS

## 2017-08-23 MED ORDER — VITAMIN D3 25 MCG (1000 UNIT) PO TABS
1000.0000 [IU] | ORAL_TABLET | Freq: Two times a day (BID) | ORAL | Status: DC
  Administered 2017-08-23 – 2017-08-25 (×4): 1000 [IU] via ORAL
  Filled 2017-08-23 (×14): qty 1

## 2017-08-23 MED ORDER — ENOXAPARIN SODIUM 30 MG/0.3ML ~~LOC~~ SOLN
30.0000 mg | SUBCUTANEOUS | Status: DC
  Administered 2017-08-23: 30 mg via SUBCUTANEOUS
  Filled 2017-08-23: qty 0.3

## 2017-08-23 MED ORDER — NITROFURANTOIN MACROCRYSTAL 50 MG PO CAPS
50.0000 mg | ORAL_CAPSULE | Freq: Every day | ORAL | Status: DC
  Administered 2017-08-23: 50 mg via ORAL
  Filled 2017-08-23 (×3): qty 1

## 2017-08-23 MED ORDER — SODIUM CHLORIDE 0.9 % IV SOLN
INTRAVENOUS | Status: DC
  Administered 2017-08-23: 15:00:00 via INTRAVENOUS

## 2017-08-23 MED ORDER — SERTRALINE HCL 50 MG PO TABS
50.0000 mg | ORAL_TABLET | Freq: Every day | ORAL | Status: DC
  Administered 2017-08-23 – 2017-08-24 (×2): 50 mg via ORAL
  Filled 2017-08-23 (×3): qty 1

## 2017-08-23 MED ORDER — POTASSIUM CHLORIDE IN NACL 40-0.9 MEQ/L-% IV SOLN
INTRAVENOUS | Status: DC
  Administered 2017-08-23 – 2017-08-24 (×2): 100 mL/h via INTRAVENOUS
  Filled 2017-08-23 (×3): qty 1000

## 2017-08-23 MED ORDER — ARFORMOTEROL TARTRATE 15 MCG/2ML IN NEBU
15.0000 ug | INHALATION_SOLUTION | Freq: Two times a day (BID) | RESPIRATORY_TRACT | Status: DC
  Administered 2017-08-23 – 2017-08-24 (×3): 15 ug via RESPIRATORY_TRACT
  Filled 2017-08-23 (×4): qty 2

## 2017-08-23 MED ORDER — POTASSIUM CHLORIDE 10 MEQ/100ML IV SOLN
10.0000 meq | Freq: Once | INTRAVENOUS | Status: AC
  Administered 2017-08-23: 10 meq via INTRAVENOUS
  Filled 2017-08-23: qty 100

## 2017-08-23 NOTE — ED Provider Notes (Signed)
[facility] EMERGENCY DEPARTMENT Provider Note   CSN: [id number] Arrival date & time: 08/23/17  1313     History   Chief Complaint Chief Complaint  Patient presents with  . Loss of Consciousness    HPI [name] is a 84 y.o. female.  HPI  Pt was seen at 1320. Per pt and her family, c/o sudden onset and resolution of one episode of "unresponsiveness" that occurred PTA. Pt's daughter states pt was laying down and "wouldn't wake up." Pt then became awake when EMS came to the home. Pt's daughter states pt has had multiple intermittent episodes of N/V/D and poor PO intake for the past 4 days. Has been associated with abd "pain." Pt is unable to stand/walk with her walker due to increasing generalized weakness. Denies CP/palitations, no SOB/cough, no fevers, no focal motor weakness, no tingling/numbness in extremities.   Past Medical History:  Diagnosis Date  . Anxiety   . Hypertension   . Neuropathic arthropathy   . OAB (overactive bladder)     Patient Active Problem List   Diagnosis Date Noted  . Hypertension 08/19/2016  . GAD (generalized anxiety disorder) 05/17/2016  . Pain medication agreement signed 02/15/2016  . Opioid dependence (HCC) 02/15/2016  . Vitamin D deficiency 07/29/2014  . Vitamin B 12 deficiency 07/29/2014  . Peripheral neuropathy 11/21/2013  . Overactive bladder 11/21/2013  . Depression 11/21/2013  . Scoliosis 04/02/2013  . Spinal stenosis, lumbar region, with neurogenic claudication 04/02/2013  . Chronic pain 10/21/2012  . Asthma 07/19/2007    History reviewed. No pertinent surgical history.  OB History    No data available       Home Medications    Prior to Admission medications   Medication Sig Start Date End Date Taking? Authorizing Provider  acetaminophen (TYLENOL) 500 MG tablet Take 500 mg by mouth every 6 (six) hours as needed for mild pain.   Yes [provider]  arformoterol (BROVANA) 15 MCG/2ML NEBU Take 2 mLs (15  mcg total) by nebulization 2 (two) times daily. 10/16/15  Yes Hawks, [name], [name]  Cholecalciferol (VITAMIN D3 PO) Take 1 tablet by mouth 2 (two) times daily.   Yes [provider]  gabapentin (NEURONTIN) 800 MG tablet TAKE (1) TABLET THREE TIMES DAILY. 08/10/17  Yes Hawks, [name], [name]  HYDROcodone-acetaminophen (NORCO/VICODIN) 5-325 MG tablet Take 1 tablet by mouth every 12 (twelve) hours as needed for moderate pain. 08/15/17  Yes Hawks, [name], [name]  LORazepam (ATIVAN) 0.5 MG tablet Take 0.5 tablets (0.25 mg total) by mouth every 12 (twelve) hours as needed for anxiety. 08/15/17  Yes Hawks, [name], [name]  montelukast (SINGULAIR) 10 MG tablet Take 1 tablet (10 mg total) by mouth at bedtime. 02/21/17  Yes Hawks, [name], [name]  nitrofurantoin (MACRODANTIN) 50 MG capsule Take 1 capsule (50 mg total) by mouth at bedtime. 05/04/17  Yes Hawks, [name], [name]  ondansetron (ZOFRAN ODT) 4 MG disintegrating tablet Take 1 tablet (4 mg total) by mouth every 8 (eight) hours as needed for nausea or vomiting. 08/22/17  Yes Hawks, [name], [name]  oxybutynin (DITROPAN) 5 MG tablet TAKE (1) TABLET THREE TIMES DAILY. 08/15/17  Yes Hawks, [name], [name]  sertraline (ZOLOFT) 50 MG tablet Take 1 tablet (50 mg total) by mouth daily. 02/21/17  Yes [name], [name]  Incontinence Supply Disposable (PROCARE ADULT BRIEFS MEDIUM) MISC Use as needed for incontinence 04/30/15   [name], [name]    Family History Family  History  Problem Relation Age of Onset  . Dementia Father   . Asthma Sister   . Asthma Mother     Social History Social History   Tobacco Use  . Smoking status: Never Smoker  . Smokeless tobacco: Never Used  Substance Use Topics  . Alcohol use: No  . Drug use: No     Allergies   Penicillins   Review of Systems Review of Systems ROS: Statement: All systems negative except as marked or noted in the HPI; Constitutional: Negative for fever and chills. ; ; Eyes: Negative for eye  pain, redness and discharge. ; ; ENMT: Negative for ear pain, hoarseness, nasal congestion, sinus pressure and sore throat. ; ; Cardiovascular: Negative for chest pain, palpitations, diaphoresis, dyspnea and peripheral edema. ; ; Respiratory: Negative for cough, wheezing and stridor. ; ; Gastrointestinal: +N/V/D, abd pain, poor PO intake. Negative for blood in stool, hematemesis, jaundice and rectal bleeding. . ; ; Genitourinary: Negative for dysuria, flank pain and hematuria. ; ; Musculoskeletal: Negative for back pain and neck pain. Negative for swelling and trauma.; ; Skin: Negative for pruritus, rash, abrasions, blisters, bruising and skin lesion.; ; Neuro: +syncope, LOC, generalized weakness. Negative for headache, lightheadedness and neck stiffness. Negative for extremity weakness, paresthesias, involuntary movement, seizure.     Physical Exam Updated Vital Signs BP (!) 160/73 (BP Location: Left Arm)   Pulse 65   Temp 98.1 F (36.7 C) (Oral)   Resp 20   Wt 54.4 kg (120 lb)   SpO2 98%   BMI 25.08 kg/m    15:04:27 Orthostatic Vital Signs RG  Orthostatic Lying   BP- Lying: 175/79  Pulse- Lying: 78      Orthostatic Sitting  BP- Sitting: 186/73  Pulse- Sitting: 85      Orthostatic Standing at 0 minutes  BP- Standing at 0 minutes:  (Pt unable to stand)     Physical Exam 1325: Physical examination:  Nursing notes reviewed; Vital signs and O2 SAT reviewed;  Constitutional: Well developed, Well nourished, In no acute distress; Head:  Normocephalic, atraumatic; Eyes: EOMI, PERRL, No scleral icterus; ENMT: Mouth and pharynx normal, Mucous membranes dry;; Neck: Supple, Full range of motion, No lymphadenopathy; Cardiovascular: Regular rate and rhythm, No gallop; Respiratory: Breath sounds clear & equal bilaterally, No wheezes.  Speaking full sentences with ease, Normal respiratory effort/excursion; Chest: Nontender, Movement normal; Abdomen: Soft, +mild diffuse tenderness to palp.  Nondistended, Normal bowel sounds; Genitourinary: No CVA tenderness; Extremities: Pulses normal, No tenderness, No edema, No calf edema or asymmetry.; Neuro: AA&Ox3, Major CN grossly intact. No facial droop. Speech clear. Grips equal. Strength 5/5 equal bilat UE's and LE's. No gross focal motor or sensory deficits in extremities.; Skin: Color normal, Warm, Dry.   ED Treatments / Results  Labs (all labs ordered are listed, but only abnormal results are displayed)   EKG  EKG Interpretation  Date/Time:  Wednesday August 23 2017 14:09:57 EST Ventricular Rate:  87 PR Interval:    QRS Duration: 121 QT Interval:  410 QTC Calculation: 420 R Axis:   59 Text Interpretation:  Sinus rhythm Paired ventricular premature complexes Short PR interval Nonspecific intraventricular conduction delay Borderline T abnormalities, anterior leads Baseline wander Artifact When compared with ECG of 07/16/2016 Normal sinus rhythm has replaced Atrial fibrillation Confirmed by Samuel Jester [phone]) on 08/23/2017 2:38:16 PM       Radiology   Procedures Procedures (including critical care time)  Medications Ordered in ED Medications  0.9 %  sodium  chloride infusion ( Intravenous New Bag/Given 08/23/17 1445)     Initial Impression / Assessment and Plan / ED Course  I have reviewed the triage vital signs and the nursing notes.  Pertinent labs & imaging results that were available during my care of the patient were reviewed by me and considered in my medical decision making (see chart for details).  MDM Reviewed: previous chart, nursing note and vitals Reviewed previous: labs and ECG Interpretation: labs, ECG, x-ray and CT scan   Results for orders placed or performed during the hospital encounter of 08/23/17  Comprehensive metabolic panel  Result Value Ref Range   Sodium 142 135 - 145 mmol/L   Potassium 3.3 (L) 3.5 - 5.1 mmol/L   Chloride 104 101 - 111 mmol/L   CO2 24 22 - 32 mmol/L   Glucose,  Bld 103 (H) 65 - 99 mg/dL   BUN 17 6 - 20 mg/dL   Creatinine, Ser 4.091.13 (H) 0.44 - 1.00 mg/dL   Calcium 9.6 8.9 - 81.110.3 mg/dL   Total Protein 7.2 6.5 - 8.1 g/dL   Albumin 4.3 3.5 - 5.0 g/dL   AST 22 15 - 41 U/L   ALT 15 14 - 54 U/L   Alkaline Phosphatase 76 38 - 126 U/L   Total Bilirubin 0.9 0.3 - 1.2 mg/dL   GFR calc non Af Amer 41 (L) >60 mL/min   GFR calc Af Amer 48 (L) >60 mL/min   Anion gap 14 5 - 15  Lipase, blood  Result Value Ref Range   Lipase 23 11 - 51 U/L  Troponin I  Result Value Ref Range   Troponin I <0.03 <0.03 ng/mL  Lactic acid, plasma  Result Value Ref Range   Lactic Acid, Venous 0.8 0.5 - 1.9 mmol/L  CBC with Differential  Result Value Ref Range   WBC 8.2 4.0 - 10.5 K/uL   RBC 4.61 3.87 - 5.11 MIL/uL   Hemoglobin 13.4 12.0 - 15.0 g/dL   HCT 91.440.0 78.236.0 - 95.646.0 %   MCV 86.8 78.0 - 100.0 fL   MCH 29.1 26.0 - 34.0 pg   MCHC 33.5 30.0 - 36.0 g/dL   RDW 21.313.3 08.611.5 - 57.815.5 %   Platelets 218 150 - 400 K/uL   Neutrophils Relative % 84 %   Neutro Abs 6.8 1.7 - 7.7 K/uL   Lymphocytes Relative 10 %   Lymphs Abs 0.8 0.7 - 4.0 K/uL   Monocytes Relative 6 %   Monocytes Absolute 0.5 0.1 - 1.0 K/uL   Eosinophils Relative 0 %   Eosinophils Absolute 0.0 0.0 - 0.7 K/uL   Basophils Relative 0 %   Basophils Absolute 0.0 0.0 - 0.1 K/uL  Urinalysis, Routine w reflex microscopic  Result Value Ref Range   Color, Urine YELLOW YELLOW   APPearance CLEAR CLEAR   Specific Gravity, Urine 1.020 1.005 - 1.030   pH 5.0 5.0 - 8.0   Glucose, UA NEGATIVE NEGATIVE mg/dL   Hgb urine dipstick NEGATIVE NEGATIVE   Bilirubin Urine NEGATIVE NEGATIVE   Ketones, ur 20 (A) NEGATIVE mg/dL   Protein, ur 469100 (A) NEGATIVE mg/dL   Nitrite NEGATIVE NEGATIVE   Leukocytes, UA NEGATIVE NEGATIVE   RBC / HPF 6-30 0 - 5 RBC/hpf   WBC, UA 0-5 0 - 5 WBC/hpf   Bacteria, UA NONE SEEN NONE SEEN   Squamous Epithelial / LPF 0-5 (A) NONE SEEN   Mucus PRESENT    Ct Abdomen Pelvis Wo Contrast Result Date:  08/23/2017 CLINICAL DATA:  84 year old female with history of diarrhea, nausea, vomiting and abdominal pain. EXAM: CT ABDOMEN AND PELVIS WITHOUT CONTRAST TECHNIQUE: Multidetector CT imaging of the abdomen and pelvis was performed following the standard protocol without IV contrast. COMPARISON:  CT the abdomen and pelvis 10/21/2012. FINDINGS: Lower chest: Atherosclerotic calcifications in the left circumflex and right coronary arteries. Small hiatal hernia. Hepatobiliary: 14 mm low-attenuation lesion in the central aspect of segment 8 of the liver, incompletely characterized on today's noncontrast CT examination, but similar to the prior study, statistically likely to represent a cyst. No other suspicious appearing hepatic lesions are noted on today's noncontrast CT examination. Gallbladder is unremarkable in appearance. Pancreas: No definite pancreatic mass or peripancreatic fluid or inflammatory changes are noted on today's noncontrast CT examination. Spleen: Unremarkable. Adrenals/Urinary Tract: No calcifications are noted within the collecting system of either kidney, along the course of either ureter, or within the lumen of the urinary bladder. No hydroureteronephrosis. Exophytic 1.5 cm high attenuation lesion extending from the lower pole of the left kidney, incompletely characterized on today's noncontrast CT examination, but statistically likely a proteinaceous/hemorrhagic cysts. Unenhanced appearance of the urinary bladder is normal. Bilateral adrenal glands are normal in appearance. Stomach/Bowel: Unenhanced appearance of the stomach is normal. No pathologic dilatation of small bowel or colon. A few scattered colonic diverticulae are noted, without surrounding inflammatory changes to suggest an acute diverticulitis at this time. Normal appendix. Vascular/Lymphatic: Aortic atherosclerosis without definite aneurysm in the abdominal or pelvic vasculature. No lymphadenopathy noted in the abdomen or pelvis.  Reproductive: Uterus and ovaries are atrophic. Other: No significant volume of ascites.  No pneumoperitoneum. Musculoskeletal: There are no aggressive appearing lytic or blastic lesions noted in the visualized portions of the skeleton. IMPRESSION: 1. No acute findings are noted in the abdomen or pelvis to account for the patient's symptoms. 2. Colonic diverticulosis without evidence of acute diverticulitis at this time. 3. Aortic atherosclerosis, in addition to at least 2 vessel coronary artery disease. 4. Small hiatal hernia. 5. Additional incidental findings, as above. Aortic Atherosclerosis (ICD10-I70.0). Electronically Signed   By: Trudie Reed M.D.   On: 08/23/2017 14:48   Dg Chest 2 View Result Date: 08/23/2017 CLINICAL DATA:  Hypertension with nausea and vomiting EXAM: CHEST  2 VIEW COMPARISON:  July 16, 2016. FINDINGS: There is no edema or consolidation. Heart size and pulmonary vascularity are normal. No adenopathy. There is aortic atherosclerosis. There is evidence of a prior rib fracture involving the posterior right fifth rib. There is degenerative change in both shoulders. IMPRESSION: No edema or consolidation. Heart size normal. There is aortic atherosclerosis. Aortic Atherosclerosis (ICD10-I70.0). Electronically Signed   By: Bretta Bang III M.D.   On: 08/23/2017 15:03   Ct Head Wo Contrast Result Date: 08/23/2017 CLINICAL DATA:  Altered mental status EXAM: CT HEAD WITHOUT CONTRAST TECHNIQUE: Contiguous axial images were obtained from the base of the skull through the vertex without intravenous contrast. COMPARISON:  July 12, 2013 FINDINGS: Brain: There is moderate diffuse atrophy. There is a calcified meningioma arising from the left tentorium extending to the midline measuring 2.2 x 1.0 x 0.8 cm. There is no surrounding edema, and there is no appreciable mass effect from this calcified meningioma. No other mass evident. There is no hemorrhage, extra-axial fluid collection, or  midline shift. There is patchy small vessel disease throughout the centra semiovale bilaterally. Elsewhere gray-white compartments appear normal. No acute infarct evident. Vascular: There is no appreciable hyperdense vessel. There is no appreciable vascular  calcification. Skull: Bony calvarium appears intact. Sinuses/Orbits: There is mild mucosal thickening in several ethmoid air cells. Paranasal sinuses otherwise are clear. Orbits appear symmetric bilaterally. Other: Visualized mastoid air cells are clear. IMPRESSION: Atrophy with patchy supratentorial small vessel disease. No acute infarct. No hemorrhage. Benign calcified meningioma arising from the left tentorium extending to the midline, stable. No other mass. There is mild mucosal thickening in several ethmoid air cells. Electronically Signed   By: Bretta Bang III M.D.   On: 08/23/2017 14:43    1640:  Pt unable to stand for orthostatic VS. Potassium repleted IV. Judicious IVF given for clinical dehydration. Dx and testing d/w pt and family.  Questions answered.  Verb understanding, agreeable to admit.  T/C returned from [facility] Willette Pa, case discussed, including:  HPI, pertinent PM/SHx, VS/PE, dx testing, ED course and treatment:  Agreeable to admit.     Final Clinical Impressions(s) / ED Diagnoses   Final diagnoses:  None    ED Discharge Orders    None       Samuel Jester, DO 08/27/17 [id number]

## 2017-08-23 NOTE — H&P (Signed)
History and Physical    ORRIE LASCANO Montoya:[id number] DOB: 1924/10/19 DOA: 08/23/2017  PCP: Alexandra Spencer, [name]   Patient coming from: Home  I have personally briefly reviewed patient's old medical records in [facility]  Chief Complaint: Loss of consciousness  HPI: [name] is Montoya 84 y.o. female with medical history significant of asthma, hypertension, anxiety, overactive bladder, depression, full neuropathy and chronic pain who presents the emergency department after Montoya sudden episode of unresponsiveness that resolved spontaneously.  It occurred prior to admission.  The daughter states the patient was laying down and just would not wake up.  She became awake when EMS came to the house.  Her daughter states that her mom has had multiple intermittent episodes of nausea vomiting diarrhea and very poor p.o. intake for the past 4 days.  This is been associated with abdominal pain and discomfort.  The patient was evaluated in the emergency department and the plan was sent to send her home.  When she went to stand up she was unable to stand and walk with her walker as she usually does because of her profound generalized weakness.  This is new for the patient.  She denies any chest pains or palpitations she has no shortness of breath no cough.  She currently has no fever, she has no focal motor weakness, she has no tingliness or known laying in her extremities.  She did not have any episodes of diarrhea nausea or vomiting while in the emergency department.  However given her inability to ambulate and profound weakness she is being referred to me for further evaluation and management In the Emergency department the patient was found to have some mild hypokalemia and an elevated creatinine that is more consistent with Montoya chronic kidney disease stage III.  Review of Systems: As per HPI otherwise complete review of systems negative.    Past Medical History:  Diagnosis Date  . Anxiety   .  Hypertension   . Neuropathic arthropathy   . OAB (overactive bladder)     History reviewed. No pertinent surgical history.   reports that  has never smoked. she has never used smokeless tobacco. She reports that she does not drink alcohol or use drugs.  Allergies  Allergen Reactions  . Penicillins Other (See Comments)    Has patient had Montoya PCN reaction causing immediate rash, facial/tongue/throat swelling, SOB or lightheadedness with hypotension: Unknown Has patient had Montoya PCN reaction causing severe rash involving mucus membranes or skin necrosis: Unknown Has patient had Montoya PCN reaction that required hospitalization: Unknown Has patient had Montoya PCN reaction occurring within the last 10 years: No If all of the above answers are "NO", then may proceed with Cephalosporin use.     Family History  Problem Relation Age of Onset  . Dementia Father   . Asthma Sister   . Asthma Mother     Prior to Admission medications   Medication Sig Start Date End Date Taking? Authorizing Provider  acetaminophen (TYLENOL) 500 MG tablet Take 500 mg by mouth every 6 (six) hours as needed for mild pain.   Yes [provider]  arformoterol (BROVANA) 15 MCG/2ML NEBU Take 2 mLs (15 mcg total) by nebulization 2 (two) times daily. 10/16/15  Yes Montoya, Alexandra A, [name]  Cholecalciferol (VITAMIN D3 PO) Take 1 tablet by mouth 2 (two) times daily.   Yes [provider]  gabapentin (NEURONTIN) 800 MG tablet TAKE (1) TABLET THREE TIMES DAILY. 08/10/17  Yes  Montoya, Alexandra A, [name]  HYDROcodone-acetaminophen (NORCO/VICODIN) 5-325 MG tablet Take 1 tablet by mouth every 12 (twelve) hours as needed for moderate pain. 08/15/17  Yes Montoya, Alexandra A, [name]  LORazepam (ATIVAN) 0.5 MG tablet Take 0.5 tablets (0.25 mg total) by mouth every 12 (twelve) hours as needed for anxiety. 08/15/17  Yes Montoya, Alexandra A, [name]  montelukast (SINGULAIR) 10 MG tablet Take 1 tablet (10 mg total) by mouth at bedtime. 02/21/17  Yes Montoya,  Alexandra A, [name]  nitrofurantoin (MACRODANTIN) 50 MG capsule Take 1 capsule (50 mg total) by mouth at bedtime. 05/04/17  Yes Montoya, Alexandra A, [name]  ondansetron (ZOFRAN ODT) 4 MG disintegrating tablet Take 1 tablet (4 mg total) by mouth every 8 (eight) hours as needed for nausea or vomiting. 08/22/17  Yes Montoya, Alexandra A, [name]  oxybutynin (DITROPAN) 5 MG tablet TAKE (1) TABLET THREE TIMES DAILY. 08/15/17  Yes Montoya, Alexandra A, [name]  sertraline (ZOLOFT) 50 MG tablet Take 1 tablet (50 mg total) by mouth daily. 02/21/17  Yes Alexandra Rodney A, [name]  Incontinence Supply Disposable (PROCARE ADULT BRIEFS MEDIUM) MISC Use as needed for incontinence 04/30/15   Alexandra Spencer, [name]    Physical Exam: Vitals:   08/23/17 1757 08/23/17 1823 08/23/17 1926 08/23/17 2024  BP: (!) 147/71 (!) 172/84    Pulse: 67 65    Resp: 20 20    Temp:  98.3 F (36.8 C)    TempSrc:  Oral    SpO2: 96% 99%  94%  Weight:   68.1 kg (150 lb 3.2 oz)   Height:   5\' 8"  (1.727 m)    .TCS Constitutional: Weak frail acutely ill-appearing Vitals:   08/23/17 1757 08/23/17 1823 08/23/17 1926 08/23/17 2024  BP: (!) 147/71 (!) 172/84    Pulse: 67 65    Resp: 20 20    Temp:  98.3 F (36.8 C)    TempSrc:  Oral    SpO2: 96% 99%  94%  Weight:   68.1 kg (150 lb 3.2 oz)   Height:   5\' 8"  (1.727 m)    Eyes: PERRL, lids and conjunctivae normal ENMT: Mucous membranes are dry. Posterior pharynx clear of any exudate or lesions.Normal dentition.  Neck: normal, supple, no masses, no thyromegaly Respiratory: clear to auscultation bilaterally, no wheezing, no crackles. Normal respiratory effort. No accessory muscle use.  Cardiovascular: Regular rate and rhythm, no murmurs / rubs / gallops. No extremity edema. 2+ pedal pulses. No carotid bruits.  Abdomen: Mild diffuse abdominal discomfort with palpation, no masses palpated. No hepatosplenomegaly. Bowel sounds positive.  No rebound or guarding Musculoskeletal: no clubbing / cyanosis. No joint  deformity upper and lower extremities. Good ROM, no contractures. Normal muscle tone.  Skin: no rashes, lesions, ulcers. No induration Neurologic: CN 2-12 grossly intact. Sensation intact, DTR normal. Strength 5/5 in all 4.  Psychiatric: Normal judgment and insight. Alert and oriented x 3. Normal mood.    Labs on Admission: I have personally reviewed following labs and imaging studies  CBC: Recent Labs  Lab 08/23/17 1353  WBC 8.2  NEUTROABS 6.8  HGB 13.4  HCT 40.0  MCV 86.8  PLT 218   Basic Metabolic Panel: Recent Labs  Lab 08/23/17 1353  NA 142  K 3.3*  CL 104  CO2 24  GLUCOSE 103*  BUN 17  CREATININE 1.13*  CALCIUM 9.6   GFR: Estimated Creatinine Clearance: 33.4 mL/min (Montoya) (by C-G formula based on SCr of 1.13 mg/dL (H)). Liver Function Tests:  Recent Labs  Lab 08/23/17 1353  AST 22  ALT 15  ALKPHOS 76  BILITOT 0.9  PROT 7.2  ALBUMIN 4.3   Recent Labs  Lab 08/23/17 1353  LIPASE 23   No results for input(s): AMMONIA in the last 168 hours. Coagulation Profile: No results for input(s): INR, PROTIME in the last 168 hours. Cardiac Enzymes: Recent Labs  Lab 08/23/17 1353  TROPONINI <0.03   BNP (last 3 results) No results for input(s): PROBNP in the last 8760 hours. HbA1C: No results for input(s): HGBA1C in the last 72 hours. CBG: No results for input(s): GLUCAP in the last 168 hours. Lipid Profile: No results for input(s): CHOL, HDL, LDLCALC, TRIG, CHOLHDL, LDLDIRECT in the last 72 hours. Thyroid Function Tests: No results for input(s): TSH, T4TOTAL, FREET4, T3FREE, THYROIDAB in the last 72 hours. Anemia Panel: No results for input(s): VITAMINB12, FOLATE, FERRITIN, TIBC, IRON, RETICCTPCT in the last 72 hours. Urine analysis:    Component Value Date/Time   COLORURINE YELLOW 08/23/2017 1330   APPEARANCEUR CLEAR 08/23/2017 1330   APPEARANCEUR Clear 07/21/2017 1729   LABSPEC 1.020 08/23/2017 1330   PHURINE 5.0 08/23/2017 1330   GLUCOSEU NEGATIVE  08/23/2017 1330   HGBUR NEGATIVE 08/23/2017 1330   BILIRUBINUR NEGATIVE 08/23/2017 1330   BILIRUBINUR Negative 07/21/2017 1729   KETONESUR 20 (Montoya) 08/23/2017 1330   PROTEINUR 100 (Montoya) 08/23/2017 1330   UROBILINOGEN negative 07/02/2015 1101   UROBILINOGEN 1.0 10/21/2012 1741   NITRITE NEGATIVE 08/23/2017 1330   LEUKOCYTESUR NEGATIVE 08/23/2017 1330   LEUKOCYTESUR Negative 07/21/2017 1729    Radiological Exams on Admission: Ct Abdomen Pelvis Wo Contrast  Result Date: 08/23/2017 CLINICAL DATA:  84 year old female with history of diarrhea, nausea, vomiting and abdominal pain. EXAM: CT ABDOMEN AND PELVIS WITHOUT CONTRAST TECHNIQUE: Multidetector CT imaging of the abdomen and pelvis was performed following the standard protocol without IV contrast. COMPARISON:  CT the abdomen and pelvis 10/21/2012. FINDINGS: Lower chest: Atherosclerotic calcifications in the left circumflex and right coronary arteries. Small hiatal hernia. Hepatobiliary: 14 mm low-attenuation lesion in the central aspect of segment 8 of the liver, incompletely characterized on today's noncontrast CT examination, but similar to the prior study, statistically likely to represent Montoya cyst. No other suspicious appearing hepatic lesions are noted on today's noncontrast CT examination. Gallbladder is unremarkable in appearance. Pancreas: No definite pancreatic mass or peripancreatic fluid or inflammatory changes are noted on today's noncontrast CT examination. Spleen: Unremarkable. Adrenals/Urinary Tract: No calcifications are noted within the collecting system of either kidney, along the course of either ureter, or within the lumen of the urinary bladder. No hydroureteronephrosis. Exophytic 1.5 cm high attenuation lesion extending from the lower pole of the left kidney, incompletely characterized on today's noncontrast CT examination, but statistically likely Montoya proteinaceous/hemorrhagic cysts. Unenhanced appearance of the urinary bladder is normal.  Bilateral adrenal glands are normal in appearance. Stomach/Bowel: Unenhanced appearance of the stomach is normal. No pathologic dilatation of small bowel or colon. Montoya few scattered colonic diverticulae are noted, without surrounding inflammatory changes to suggest an acute diverticulitis at this time. Normal appendix. Vascular/Lymphatic: Aortic atherosclerosis without definite aneurysm in the abdominal or pelvic vasculature. No lymphadenopathy noted in the abdomen or pelvis. Reproductive: Uterus and ovaries are atrophic. Other: No significant volume of ascites.  No pneumoperitoneum. Musculoskeletal: There are no aggressive appearing lytic or blastic lesions noted in the visualized portions of the skeleton. IMPRESSION: 1. No acute findings are noted in the abdomen or pelvis to account for the patient's symptoms. 2.  Colonic diverticulosis without evidence of acute diverticulitis at this time. 3. Aortic atherosclerosis, in addition to at least 2 vessel coronary artery disease. 4. Small hiatal hernia. 5. Additional incidental findings, as above. Aortic Atherosclerosis (ICD10-I70.0). Electronically Signed   By: Trudie Reed M.D.   On: 08/23/2017 14:48   Dg Chest 2 View  Result Date: 08/23/2017 CLINICAL DATA:  Hypertension with nausea and vomiting EXAM: CHEST  2 VIEW COMPARISON:  July 16, 2016. FINDINGS: There is no edema or consolidation. Heart size and pulmonary vascularity are normal. No adenopathy. There is aortic atherosclerosis. There is evidence of Montoya prior rib fracture involving the posterior right fifth rib. There is degenerative change in both shoulders. IMPRESSION: No edema or consolidation. Heart size normal. There is aortic atherosclerosis. Aortic Atherosclerosis (ICD10-I70.0). Electronically Signed   By: Bretta Bang III M.D.   On: 08/23/2017 15:03   Ct Head Wo Contrast  Result Date: 08/23/2017 CLINICAL DATA:  Altered mental status EXAM: CT HEAD WITHOUT CONTRAST TECHNIQUE: Contiguous axial  images were obtained from the base of the skull through the vertex without intravenous contrast. COMPARISON:  July 12, 2013 FINDINGS: Brain: There is moderate diffuse atrophy. There is Montoya calcified meningioma arising from the left tentorium extending to the midline measuring 2.2 x 1.0 x 0.8 cm. There is no surrounding edema, and there is no appreciable mass effect from this calcified meningioma. No other mass evident. There is no hemorrhage, extra-axial fluid collection, or midline shift. There is patchy small vessel disease throughout the centra semiovale bilaterally. Elsewhere gray-white compartments appear normal. No acute infarct evident. Vascular: There is no appreciable hyperdense vessel. There is no appreciable vascular calcification. Skull: Bony calvarium appears intact. Sinuses/Orbits: There is mild mucosal thickening in several ethmoid air cells. Paranasal sinuses otherwise are clear. Orbits appear symmetric bilaterally. Other: Visualized mastoid air cells are clear. IMPRESSION: Atrophy with patchy supratentorial small vessel disease. No acute infarct. No hemorrhage. Benign calcified meningioma arising from the left tentorium extending to the midline, stable. No other mass. There is mild mucosal thickening in several ethmoid air cells. Electronically Signed   By: Bretta Bang III M.D.   On: 08/23/2017 14:43     Assessment/Plan Principal Problem:   Dehydration Active Problems:   Viral gastroenteritis   Asthma   Chronic pain   Peripheral neuropathy   Overactive bladder   Depression   Chronic kidney disease, stage 3 (HCC)   Diverticulosis   Vitamin D deficiency   Hypokalemia  1.  Dehydration: Likely brought on by the patient's nausea vomiting and diarrhea at home.  We will admit her into the hospital and gently hydrate her.  Hopefully this will improve her energy level and she will be able to stand and ambulate tomorrow.  We will ask physical therapy to evaluate her as well.  2.   Viral gastroenteritis: Continue supportive care.  Likely cause of patient's mild hypokalemia.  3.  Asthma: Continue home montelukast as well as Brovana.  4.  Chronic pain: Patient apparently has had problems in the past with pain medications.  She is on Montoya pain medication contract and currently receives hydrocodone acetaminophen.  5.  Peripheral neuropathy: Continue gabapentin.  6.  Overactive bladder: Continue Ditropan.  7.  Depression: Continue sertraline.  8.  Chronic kidney disease stage III: Noted avoid nephrotoxic agents.  Patient's creatinine has been approximately 1.2-1.4 for several years.  9.  Diverticulosis: This was noted on CT scan obtained today would recommend avoidance of small seeds etc. we will  ask nutrition to evaluate patient and give her an appropriate diet.  10.  Vitamin D deficiency: Would recommend continued use of cholecalciferol.  11.  Hypokalemia: We will replete IV likely due to insensible losses and nausea and vomiting and diarrhea.  DVT prophylaxis: Lovenox Code Status: DO NOT RESUSCITATE Family Communication: Spoke with patient's daughter who is present at the bedside   disposition Plan: Likely home in 24-48 hours Consults called: None Admission status: Observation   Lahoma Crockerheresa C Nakyra Bourn MD FACP [facility] Pager [phone]  If 7PM-7AM, please contact night-coverage [email or web address] Password [email or web address]  08/23/2017, 9:00 PM

## 2017-08-23 NOTE — ED Triage Notes (Signed)
EMS called out to pts daughters home due to daughter unable to wake up. When EMS arrived pt responded to them and stated if you touch me I will knock you out. Report pt didn't want to come. Daughter reports pt has not eaten since Saturday due to vomiting and diarrhea. Pt given po zofran by pcp.Her orientation is at baseline. Pt reports she wants to die to EMS. Complaining of right abdomen pain

## 2017-08-23 NOTE — ED Notes (Signed)
Pt transported to CT ?

## 2017-08-24 ENCOUNTER — Other Ambulatory Visit (HOSPITAL_COMMUNITY): Payer: Medicare Other

## 2017-08-24 ENCOUNTER — Observation Stay (HOSPITAL_BASED_OUTPATIENT_CLINIC_OR_DEPARTMENT_OTHER)
Admit: 2017-08-24 | Discharge: 2017-08-24 | Disposition: A | Discharge status: Home / Self Care | Payer: Medicare Other | Attending: Internal Medicine | Admitting: Internal Medicine

## 2017-08-24 DIAGNOSIS — G9341 Metabolic encephalopathy: Secondary | ICD-10-CM | POA: Diagnosis not present

## 2017-08-24 DIAGNOSIS — E876 Hypokalemia: Secondary | ICD-10-CM

## 2017-08-24 DIAGNOSIS — E860 Dehydration: Secondary | ICD-10-CM

## 2017-08-24 DIAGNOSIS — N183 Chronic kidney disease, stage 3 (moderate): Secondary | ICD-10-CM | POA: Diagnosis not present

## 2017-08-24 DIAGNOSIS — R55 Syncope and collapse: Secondary | ICD-10-CM | POA: Diagnosis not present

## 2017-08-24 DIAGNOSIS — G894 Chronic pain syndrome: Secondary | ICD-10-CM

## 2017-08-24 DIAGNOSIS — F1120 Opioid dependence, uncomplicated: Secondary | ICD-10-CM

## 2017-08-24 DIAGNOSIS — A084 Viral intestinal infection, unspecified: Secondary | ICD-10-CM | POA: Diagnosis not present

## 2017-08-24 DIAGNOSIS — G40909 Epilepsy, unspecified, not intractable, without status epilepticus: Secondary | ICD-10-CM | POA: Diagnosis not present

## 2017-08-24 LAB — MAGNESIUM: MAGNESIUM: 1.7 mg/dL (ref 1.7–2.4)

## 2017-08-24 LAB — BASIC METABOLIC PANEL
ANION GAP: 11 (ref 5–15)
BUN: 17 mg/dL (ref 6–20)
CALCIUM: 9 mg/dL (ref 8.9–10.3)
CHLORIDE: 107 mmol/L (ref 101–111)
CO2: 22 mmol/L (ref 22–32)
CREATININE: 1.01 mg/dL — AB (ref 0.44–1.00)
GFR calc non Af Amer: 48 mL/min — ABNORMAL LOW (ref >60)
GFR, EST AFRICAN AMERICAN: 55 mL/min — AB (ref >60)
Glucose, Bld: 80 mg/dL (ref 65–99)
Potassium: 3.8 mmol/L (ref 3.5–5.1)
SODIUM: 140 mmol/L (ref 135–145)

## 2017-08-24 LAB — VITAMIN B12: VITAMIN B 12: 146 pg/mL — AB (ref 180–914)

## 2017-08-24 MED ORDER — ENOXAPARIN SODIUM 40 MG/0.4ML ~~LOC~~ SOLN
40.0000 mg | SUBCUTANEOUS | Status: DC
  Administered 2017-08-24: 40 mg via SUBCUTANEOUS
  Filled 2017-08-24: qty 0.4

## 2017-08-24 MED ORDER — POTASSIUM CHLORIDE IN NACL 40-0.9 MEQ/L-% IV SOLN: INTRAVENOUS | Status: DC

## 2017-08-24 MED ORDER — POTASSIUM CHLORIDE IN NACL 40-0.9 MEQ/L-% IV SOLN
INTRAVENOUS | Status: AC
  Administered 2017-08-24 – 2017-08-25 (×3): 75 mL/h via INTRAVENOUS

## 2017-08-24 NOTE — Progress Notes (Signed)
EEG completed; results pending.    

## 2017-08-24 NOTE — Procedures (Signed)
ELECTROENCEPHALOGRAM REPORT  Date of Study: 08/24/2017  Patient's Name: [name] MRN: [id number] Date of Birth: 1924-09-10  Referring Provider: Dr. Onalee Huaavid Tat  Clinical History: This is a 84 year old woman with an episode of unresponsiveness  Medications: gabapentin (NEURONTIN) capsule 800 mg  acetaminophen (TYLENOL) tablet 500 mg  arformoterol (BROVANA) nebulizer solution 15 mcg  cholecalciferol (VITAMIN D) tablet 1,000 Units  enoxaparin (LOVENOX) injection 30 mg  HYDROcodone-acetaminophen (NORCO/VICODIN) 5-325 MG per tablet 1 tablet  LORazepam (ATIVAN) tablet 0.25 mg  montelukast (SINGULAIR) tablet 10 mg  oxybutynin (DITROPAN) tablet 5 mg  sertraline (ZOLOFT) tablet 50 mg   Technical Summary: A multichannel digital EEG recording measured by the international 10-20 system with electrodes applied with paste and impedances below 5000 ohms performed in our laboratory with EKG monitoring in an awake and asleep patient.  Hyperventilation and photic stimulation were not performed.  The digital EEG was referentially recorded, reformatted, and digitally filtered in a variety of bipolar and referential montages for optimal display.    Description: The patient is awake and asleep during the recording.  During maximal wakefulness, there is a symmetric, medium voltage 8-9 Hz posterior dominant rhythm that attenuates with eye opening.  There is occasional focal 4-5 Hz theta slowing over the right temporal region.  During drowsiness and sleep, there is an increase in theta slowing of the background, at times sharply contoured over the bilateral temporal regions.  Vertex waves and symmetric sleep spindles were seen.  Hyperventilation and photic stimulation were not performed. There were occasional sharp waves over the right frontotemporal region. No electrographic seizures seen.    EKG lead was unremarkable.  Impression: This awake and asleep EEG is abnormal due to the presence of: 1.  Occasional focal slowing over the right temporal region 2. Occasional epileptiform discharges over the right frontotemporal region  Clinical Correlation of the above findings indicates focal cerebral dysfunction over the right temporal region suggestive of underlying structural or physiologic abnormality. There is possible epileptogenic potential over the right frontotemporal region. Clinical correlation is advised.   Patrcia DollyKaren Leslie Langille, M.D.

## 2017-08-24 NOTE — Progress Notes (Signed)
PROGRESS NOTE  [name] RUE:[id number] DOB: 04/15/1925 DOA: 08/23/2017 PCP: [name], [name]  Brief History:  84 year old female with history of asthma, hypertension, chronic pain, overactive bladder, CKD stage III presented with an episode of unresponsiveness.  Around 8 AM on 08/23/2017, the patient's caretaker arrived for her usual shift and had difficulty arousing the patient from bed.  Her daughter was contacted, and had difficulty arousing the patient.  EMS was activated.  By the time EMS arrived, the patient had awoken, but remained lethargic and somewhat confused.  Secondary to the patient's cognitive impairment, the patient is unable to provide any history.  According to the daughter, the patient has had 4 days of nausea, vomiting, and diarrhea without hematochezia, melena, hematemesis.  She has not had any fevers, chills, chest pain, abdominal pain, headaches.  In the emergency department, the patient had difficulty standing and using her walker.  As result, the patient was admitted for further evaluation.  The patient remained afebrile hemodynamically stable saturating 90% on room air.  Assessment/Plan: Acute metabolic encephalopathy -Secondary to dehydration -Daughter at bedside states that patient is improving -Serum B12 -EEG -UA negative for pyuria -CT brain negative for acute findings -PT evaluation  Dehydration -Secondary to GI loss -Continue IV fluids   Vomiting and diarrhea -Check C. Difficile -GI pathogen panel  -suspect viral gastroenteritis -08/23/2017 CT abdomen--negative for acute findings  CKD stage III -Baseline creatinine 1.1-1.3 -A.m. BMP  Chronic pain syndrome -Continue home dose of hydrocodone -Continue gabapentin  Hypokalemia -Replete -Check magnesium  Anxiety/depression -Continue Zoloft  Asthma -Stable on room air without wheezing -Continue Brovana   Disposition Plan:   Home 08/25/17 if stable Family Communication:    Daughter updated at bedside--Total time spent 35 minutes.  Greater than 50% spent face to face counseling and coordinating care.   Consultants:  none  Code Status: DNR  DVT Prophylaxis:  Tool Lovenox   Procedures: As Listed in Progress Note Above  Antibiotics: None    Subjective: Patient denies fevers, chills, headache, chest pain, dyspnea, nausea, vomiting, diarrhea, abdominal pain, dysuria, hematuria, hematochezia, and melena.   Objective: Vitals:   08/23/17 1926 08/23/17 2024 08/24/17 0500 08/24/17 0600  BP:  (!) 150/68  (!) 155/63  Pulse:  63  62  Resp:  16  16  Temp:  (!) 97.5 F (36.4 C)  98.8 F (37.1 C)  TempSrc:  Oral  Oral  SpO2:  94%  96%  Weight: 68.1 kg (150 lb 3.2 oz)  53.5 kg (117 lb 15.1 oz)   Height: 5\' 8"  (1.727 m)       Intake/Output Summary (Last 24 hours) at 08/24/2017 0703 Last data filed at 08/24/2017 0300 Gross per 24 hour  Intake 710 ml  Output -  Net 710 ml   Weight change:  Exam:   General:  Pt is alert, follows commands appropriately, not in acute distress  HEENT: No icterus, No thrush, No neck mass, Dougherty/AT  Cardiovascular: RRR, S1/S2, no rubs, no gallops  Respiratory: CTA bilaterally, no wheezing, no crackles, no rhonchi  Abdomen: Soft/+BS, mild epigastric tender, non distended, no guarding  Extremities: No edema, No lymphangitis, No petechiae, No rashes, no synovitis   Data Reviewed: I have personally reviewed following labs and imaging studies Basic Metabolic Panel: Recent Labs  Lab 08/23/17 1353  NA 142  K 3.3*  CL 104  CO2 24  GLUCOSE 103*  BUN 17  CREATININE 1.13*  CALCIUM 9.6   Liver Function Tests: Recent Labs  Lab 08/23/17 1353  AST 22  ALT 15  ALKPHOS 76  BILITOT 0.9  PROT 7.2  ALBUMIN 4.3   Recent Labs  Lab 08/23/17 1353  LIPASE 23   No results for input(s): AMMONIA in the last 168 hours. Coagulation Profile: No results for input(s): INR, PROTIME in the last 168 hours. CBC: Recent Labs    Lab 08/23/17 1353  WBC 8.2  NEUTROABS 6.8  HGB 13.4  HCT 40.0  MCV 86.8  PLT 218   Cardiac Enzymes: Recent Labs  Lab 08/23/17 1353  TROPONINI <0.03   BNP: Invalid input(s): POCBNP CBG: No results for input(s): GLUCAP in the last 168 hours. HbA1C: No results for input(s): HGBA1C in the last 72 hours. Urine analysis:    Component Value Date/Time   COLORURINE YELLOW 08/23/2017 1330   APPEARANCEUR CLEAR 08/23/2017 1330   APPEARANCEUR Clear 07/21/2017 1729   LABSPEC 1.020 08/23/2017 1330   PHURINE 5.0 08/23/2017 1330   GLUCOSEU NEGATIVE 08/23/2017 1330   HGBUR NEGATIVE 08/23/2017 1330   BILIRUBINUR NEGATIVE 08/23/2017 1330   BILIRUBINUR Negative 07/21/2017 1729   KETONESUR 20 (A) 08/23/2017 1330   PROTEINUR 100 (A) 08/23/2017 1330   UROBILINOGEN negative 07/02/2015 1101   UROBILINOGEN 1.0 10/21/2012 1741   NITRITE NEGATIVE 08/23/2017 1330   LEUKOCYTESUR NEGATIVE 08/23/2017 1330   LEUKOCYTESUR Negative 07/21/2017 1729   Sepsis Labs: @LABRCNTIP (procalcitonin:4,lacticidven:4) )No results found for this or any previous visit (from the past 240 hour(s)).   Scheduled Meds: . arformoterol  15 mcg Nebulization BID  . cholecalciferol  1,000 Units Oral BID  . enoxaparin (LOVENOX) injection  30 mg Subcutaneous Q24H  . gabapentin  800 mg Oral TID  . montelukast  10 mg Oral QHS  . nitrofurantoin  50 mg Oral QHS  . oxybutynin  5 mg Oral TID  . sertraline  50 mg Oral Daily  . sodium chloride flush  3 mL Intravenous Q12H   Continuous Infusions: . sodium chloride    . 0.9 % NaCl with KCl 40 mEq / L 100 mL/hr (08/24/17 0536)    Procedures/Studies: Ct Abdomen Pelvis Wo Contrast  Result Date: 08/23/2017 CLINICAL DATA:  84 year old female with history of diarrhea, nausea, vomiting and abdominal pain. EXAM: CT ABDOMEN AND PELVIS WITHOUT CONTRAST TECHNIQUE: Multidetector CT imaging of the abdomen and pelvis was performed following the standard protocol without IV contrast.  COMPARISON:  CT the abdomen and pelvis 10/21/2012. FINDINGS: Lower chest: Atherosclerotic calcifications in the left circumflex and right coronary arteries. Small hiatal hernia. Hepatobiliary: 14 mm low-attenuation lesion in the central aspect of segment 8 of the liver, incompletely characterized on today's noncontrast CT examination, but similar to the prior study, statistically likely to represent a cyst. No other suspicious appearing hepatic lesions are noted on today's noncontrast CT examination. Gallbladder is unremarkable in appearance. Pancreas: No definite pancreatic mass or peripancreatic fluid or inflammatory changes are noted on today's noncontrast CT examination. Spleen: Unremarkable. Adrenals/Urinary Tract: No calcifications are noted within the collecting system of either kidney, along the course of either ureter, or within the lumen of the urinary bladder. No hydroureteronephrosis. Exophytic 1.5 cm high attenuation lesion extending from the lower pole of the left kidney, incompletely characterized on today's noncontrast CT examination, but statistically likely a proteinaceous/hemorrhagic cysts. Unenhanced appearance of the urinary bladder is normal. Bilateral adrenal glands are normal in appearance. Stomach/Bowel: Unenhanced appearance of the stomach is normal. No pathologic dilatation of small bowel or colon.  A few scattered colonic diverticulae are noted, without surrounding inflammatory changes to suggest an acute diverticulitis at this time. Normal appendix. Vascular/Lymphatic: Aortic atherosclerosis without definite aneurysm in the abdominal or pelvic vasculature. No lymphadenopathy noted in the abdomen or pelvis. Reproductive: Uterus and ovaries are atrophic. Other: No significant volume of ascites.  No pneumoperitoneum. Musculoskeletal: There are no aggressive appearing lytic or blastic lesions noted in the visualized portions of the skeleton. IMPRESSION: 1. No acute findings are noted in the  abdomen or pelvis to account for the patient's symptoms. 2. Colonic diverticulosis without evidence of acute diverticulitis at this time. 3. Aortic atherosclerosis, in addition to at least 2 vessel coronary artery disease. 4. Small hiatal hernia. 5. Additional incidental findings, as above. Aortic Atherosclerosis (ICD10-I70.0). Electronically Signed   By: Trudie Reedaniel  Entrikin M.D.   On: 08/23/2017 14:48   Dg Chest 2 View  Result Date: 08/23/2017 CLINICAL DATA:  Hypertension with nausea and vomiting EXAM: CHEST  2 VIEW COMPARISON:  July 16, 2016. FINDINGS: There is no edema or consolidation. Heart size and pulmonary vascularity are normal. No adenopathy. There is aortic atherosclerosis. There is evidence of a prior rib fracture involving the posterior right fifth rib. There is degenerative change in both shoulders. IMPRESSION: No edema or consolidation. Heart size normal. There is aortic atherosclerosis. Aortic Atherosclerosis (ICD10-I70.0). Electronically Signed   By: Bretta BangWilliam  Woodruff III M.D.   On: 08/23/2017 15:03   Ct Head Wo Contrast  Result Date: 08/23/2017 CLINICAL DATA:  Altered mental status EXAM: CT HEAD WITHOUT CONTRAST TECHNIQUE: Contiguous axial images were obtained from the base of the skull through the vertex without intravenous contrast. COMPARISON:  July 12, 2013 FINDINGS: Brain: There is moderate diffuse atrophy. There is a calcified meningioma arising from the left tentorium extending to the midline measuring 2.2 x 1.0 x 0.8 cm. There is no surrounding edema, and there is no appreciable mass effect from this calcified meningioma. No other mass evident. There is no hemorrhage, extra-axial fluid collection, or midline shift. There is patchy small vessel disease throughout the centra semiovale bilaterally. Elsewhere gray-white compartments appear normal. No acute infarct evident. Vascular: There is no appreciable hyperdense vessel. There is no appreciable vascular calcification. Skull: Bony  calvarium appears intact. Sinuses/Orbits: There is mild mucosal thickening in several ethmoid air cells. Paranasal sinuses otherwise are clear. Orbits appear symmetric bilaterally. Other: Visualized mastoid air cells are clear. IMPRESSION: Atrophy with patchy supratentorial small vessel disease. No acute infarct. No hemorrhage. Benign calcified meningioma arising from the left tentorium extending to the midline, stable. No other mass. There is mild mucosal thickening in several ethmoid air cells. Electronically Signed   By: Bretta BangWilliam  Woodruff III M.D.   On: 08/23/2017 14:43    Catarina Hartshornavid Jaleisa Brose, DO  [facility] Pager [phone]  If 7PM-7AM, please contact night-coverage [email or web address] Password [email or web address] 08/24/2017, 7:03 AM   LOS: 0 days

## 2017-08-24 NOTE — Progress Notes (Signed)
PT Cancellation Note  Patient Details Name: Alexandra ShepherdVesta T Breshears MRN: [id number] DOB: [birth year]   Cancelled Treatment:    Reason Eval/Treat Not Completed: Patient at procedure or test/unavailable(chart reviewed, treatment attempted. Pt having EEG done at this time. Will attempt again at later date/time as pt is available. )  11:12 AM, 08/24/17 Rosamaria LintsAllan C Phuong Moffatt, PT, DPT Physical Therapist [facility] [phone] (office)      Ray Gervasi C 08/24/2017, 11:11 AM

## 2017-08-24 NOTE — Care Management Obs Status (Signed)
MEDICARE OBSERVATION STATUS NOTIFICATION   Patient Details  Name: Alexandra ShepherdVesta T Welke MRN: [id number] Date of Birth: 24-Jun-1925   Medicare Observation Status Notification Given:  Yes    Malcolm MetroChildress, Braniya Farrugia Demske, RN 08/24/2017, 11:07 AM

## 2017-08-24 NOTE — Plan of Care (Signed)
Patient is progressing.  

## 2017-08-24 NOTE — Progress Notes (Signed)
PT Cancellation Note  Patient Details Name: Alexandra ShepherdVesta T Montoya MRN: [id number] DOB: [birth year]   Cancelled Treatment:    Reason Eval/Treat Not Completed: PT screened, no needs identified, will sign off. Stopped by room to evaluate patient after EEG. Pt asleep at this moment, daughter reports unsuccessful recent attempt at waking her to eat lunch.   Daughter reports patient is total care at home for mobility/ADL, has 24/7 care, and all needed equipment. Daughter reports patient has chronic BLE weakness and limitations related to lumbar spine dysfunction. [facility] and has worked with PT in the past. Daughter reports the family and patient's goal is for the patient to remain in the home, no interest in long-term placement at this time. Pt is near baseline in terms of needed assistance for ADL, no acute equipment needs. HHPT services available if needed. PT signing off.   12:28 PM, 08/24/17 Alexandra LintsAllan C Kenan Moodie, PT, DPT Physical Therapist - [place] [phone](ASCOM)  [phone] (Office)   [name] C 08/24/2017, 12:19 PM

## 2017-08-24 NOTE — Care Management Note (Signed)
Case Management Note  Patient Details  Name: [name] MRN: [id number] Date of Birth: 11-19-1924  Subjective/Objective:             Admitted with dehydration. Pt is from home, lives with her husband, has 24/7 care provided by family and sitters. Pt has all necessary DME. Pt active with [facility]. Daughter at the bedside.       Action/Plan: DC home with resumption of HH services. Pt will need order to resume services if changed to inpt prior to DC. Olegario MessierKathy, [facility], aware of admission and will be notified when pt discharges. CM spoke with [name] at the bedside who says they also plan to get hospice involved when her parents would qualify. CM offered to provide list of hospice providers.  Expected Discharge Date:    08/25/17              Expected Discharge Plan:  Home w Home Health Services  In-House Referral:  NA  Discharge planning Services  CM Consult  Post Acute Care Choice:  Home Health, Resumption of Svcs/PTA Provider Choice offered to:  Adult Children  HH Arranged:  RN, PT HH Agency:  [facility]  Status of Service:  Completed, signed off  Malcolm MetroChildress, Edker Punt Demske, RN 08/24/2017, 12:39 PM

## 2017-08-24 NOTE — Progress Notes (Addendum)
Initial Nutrition Assessment  DOCUMENTATION CODES:  Underweight    INTERVENTION:  Diverticulosis diet related nutrition education provided to pt and grandaughter   NUTRITION DIAGNOSIS:   Inadequate oral intake related to altered GI function(gastroenteritis) as evidenced by energy intake < or equal to 50% for > or equal to 5 days, mild muscle depletion.   GOAL:   Patient will meet greater than or equal to 90% of their needs   MONITOR:   PO intake, Supplement acceptance, Labs, Weight trends  REASON FOR ASSESSMENT:   Consult Assessment of nutrition requirement/status, Diet education  ASSESSMENT:  The patient is a 84 yo female with hx of chronic pain, asthma, diverticulosis, CKD-3, Vitamin D deficiency. She presents with dehydration, viral gastroenteritis.   Home diet is regular. The pt and granddaughter say her appetite is normally very good and had been up until acute onset of illness. Today she ate 100% of her lunch and told grandaughter she could have eaten more. She is able to feed herself and says she is feeling much better today than yesterday.   Her weight has declined (6%) from 125 lb to 118 lb between November and February. Usual weight range 120-130 lb most of the past 2 years. Based on conversation with pt expect her wt loss is more related to viral gastroenteritis.   BMP Latest Ref Rng & Units 08/24/2017 08/23/2017 08/15/2017  Glucose 65 - 99 mg/dL 80 756(E103(H) 84  BUN 6 - 20 mg/dL 17 17 26   Creatinine 0.44 - 1.00 mg/dL 3.32(R1.01(H) 5.18(A1.13(H) 4.16(S1.28(H)  BUN/Creat Ratio 12 - 28 - - 20  Sodium 135 - 145 mmol/L 140 142 146(H)  Potassium 3.5 - 5.1 mmol/L 3.8 3.3(L) 4.5  Chloride 101 - 111 mmol/L 107 104 106  CO2 22 - 32 mmol/L 22 24 25   Calcium 8.9 - 10.3 mg/dL 9.0 9.6 9.1     NUTRITION - FOCUSED PHYSICAL EXAM:    Most Recent Value  Orbital Region  Mild depletion  Upper Arm Region  Mild depletion  Thoracic and Lumbar Region  Moderate depletion  Buccal Region  Mild  depletion  Temple Region  Mild depletion  Clavicle Bone Region  Moderate depletion  Clavicle and Acromion Bone Region  Moderate depletion  Scapular Bone Region  Mild depletion  Dorsal Hand  Mild depletion  Edema (RD Assessment)  None  Hair  Reviewed  Nails  Reviewed      Diet Order:  Diet regular Room service appropriate? Yes; Fluid consistency: Thin  EDUCATION NEEDS:   Education needs have been addressed(handout provided Diverticulosis- discussed high fiber foods and RD contact provided for pt and her daughter) Skin:     Last BM:  2/25  Height:   Ht Readings from Last 1 Encounters:  08/23/17 5\' 8"  (1.727 m)    Weight:   Wt Readings from Last 1 Encounters:  08/24/17 117 lb 15.1 oz (53.5 kg)    Ideal Body Weight:  64 kg  BMI:  Body mass index is 17.93 kg/m.  Estimated Nutritional Needs:   Kcal:  0630-16011550-1675  Protein:  70-75 gr  Fluid:  >1500 ml daily   Royann ShiversLynn Hawley Pavia MS,RD,CSG,LDN Office: [phone] Pager: [phone]

## 2017-08-25 DIAGNOSIS — N183 Chronic kidney disease, stage 3 (moderate): Secondary | ICD-10-CM | POA: Diagnosis not present

## 2017-08-25 DIAGNOSIS — E876 Hypokalemia: Secondary | ICD-10-CM | POA: Diagnosis not present

## 2017-08-25 DIAGNOSIS — G9341 Metabolic encephalopathy: Secondary | ICD-10-CM | POA: Diagnosis not present

## 2017-08-25 DIAGNOSIS — A084 Viral intestinal infection, unspecified: Secondary | ICD-10-CM

## 2017-08-25 DIAGNOSIS — G40909 Epilepsy, unspecified, not intractable, without status epilepticus: Secondary | ICD-10-CM

## 2017-08-25 DIAGNOSIS — E860 Dehydration: Secondary | ICD-10-CM | POA: Diagnosis not present

## 2017-08-25 LAB — URINE CULTURE: CULTURE: NO GROWTH

## 2017-08-25 MED ORDER — CYANOCOBALAMIN 500 MCG PO TABS: 500.0000 ug | ORAL_TABLET | Freq: Every day | ORAL | 0 refills | Status: AC

## 2017-08-25 MED ORDER — LEVETIRACETAM 500 MG PO TABS
500.0000 mg | ORAL_TABLET | Freq: Two times a day (BID) | ORAL | Status: DC
  Administered 2017-08-25: 500 mg via ORAL
  Filled 2017-08-25: qty 1

## 2017-08-25 MED ORDER — LEVETIRACETAM 500 MG PO TABS: 500.0000 mg | ORAL_TABLET | Freq: Two times a day (BID) | ORAL | 2 refills | Status: AC

## 2017-08-25 MED ORDER — MAGNESIUM OXIDE 400 (241.3 MG) MG PO TABS
400.0000 mg | ORAL_TABLET | Freq: Once | ORAL | Status: AC
  Administered 2017-08-25: 400 mg via ORAL
  Filled 2017-08-25: qty 1

## 2017-08-25 MED ORDER — VITAMIN B-12 100 MCG PO TABS
500.0000 ug | ORAL_TABLET | Freq: Every day | ORAL | Status: DC
  Administered 2017-08-25: 500 ug via ORAL
  Filled 2017-08-25: qty 5

## 2017-08-25 NOTE — Care Management (Signed)
HH provider made aware of DC home today. Discharging under observation, [facility].

## 2017-08-25 NOTE — Progress Notes (Signed)
Pt's IV catheter removed and intact. Pt's IV site clean dry and intact. Discharge instructions including medications and follow up appointments were reviewed and discussed with patient's daughter. All questions were answered and no further questions at this time. Pt in stable condition and in no acute distress at time of discharge. Pt escorted by nurse tech. 

## 2017-08-25 NOTE — Discharge Summary (Signed)
Physician Discharge Summary  [name]:[id number] DOB: 1925/03/22 DOA: 08/23/2017  PCP: Alexandra Spencer, [name]  Admit date: 08/23/2017 Discharge date: 08/25/2017  Admitted From: Home Disposition:  Home   Recommendations for Outpatient Follow-up:  1. Follow up with PCP in 1-2 weeks 2. Please obtain BMP/CBC in one week :  Home Health: YES Equipment/Devices: HHPT  Discharge Condition: Stable CODE STATUS:DNR Diet recommendation: Heart Healthy   Brief/Interim Summary: 84 year old female with history of asthma, hypertension, chronic pain, overactive bladder, CKD stage III presented with an episode of unresponsiveness.  Around 8 AM on 08/23/2017, the patient's caretaker arrived for her usual shift and had difficulty arousing the patient from bed.  Her daughter was contacted, and had difficulty arousing the patient.  EMS was activated.  By the time EMS arrived, the patient had awoken, but remained lethargic and somewhat confused.  Secondary to the patient's cognitive impairment, the patient is unable to provide any history.  According to the daughter, the patient has had 4 days of nausea, vomiting, and diarrhea without hematochezia, melena, hematemesis.  She has not had any fevers, chills, chest pain, abdominal pain, headaches.  In the emergency department, the patient had difficulty standing and using her walker.  As result, the patient was admitted for further evaluation.  The patient remained afebrile hemodynamically stable saturating 99% on room air.    Discharge Diagnoses:  Acute metabolic encephalopathy -Secondary to dehydration and seizure -Daughter at bedside states that patient is improving -Serum B12--146-->start supplementation -08/24/17--EEG--focal slowing in the right temporal region.  Occasional epileptiform discharges in the right  frontotemporal region -UA negative for pyuria -CT brain negative for acute findings -PT evaluation--> resume previous home health  services -Patient returned back to baseline on the day of discharge.  Dehydration -Secondary to GI loss -Continue IV fluids  -Improved  Seizure disorder -08/24/17-EEG suggests possible seizure disorder which may have contributed to the patient's encephalopathy -Start Keppra 500 mg twice daily -Outpatient referral to epileptology, Dr. Patrcia Dolly  Vomiting and diarrhea -Patient did not have any further diarrhea during the hospitalization  -suspect viral gastroenteritis -08/23/2017 CT abdomen--negative for acute findings  CKD stage III -Baseline creatinine 1.1-1.3 -Serum creatinine 1.01 on the day of discharge  Chronic pain syndrome -Continue home dose of hydrocodone -Continue gabapentin  Hypokalemia -Repleted -Check magnesium--1.7  Anxiety/depression -Continue Zoloft  Asthma -Stable on room air without wheezing -Continue Brovana    Discharge Instructions  Discharge Instructions    Ambulatory referral to Neurology   Complete by:  As directed    Refer to [facility]-refer to Dr. Karel Montoya for new diagnosis of seizure   Diet general   Complete by:  As directed    Increase activity slowly   Complete by:  As directed      Allergies as of 08/25/2017      Reactions   Penicillins Other (See Comments)   Has patient had a PCN reaction causing immediate rash, facial/tongue/throat swelling, SOB or lightheadedness with hypotension: Unknown Has patient had a PCN reaction causing severe rash involving mucus membranes or skin necrosis: Unknown Has patient had a PCN reaction that required hospitalization: Unknown Has patient had a PCN reaction occurring within the last 10 years: No If all of the above answers are "NO", then may proceed with Cephalosporin use.      Medication List    TAKE these medications   acetaminophen 500 MG tablet Commonly known as:  TYLENOL Take 500 mg by mouth every 6 (six) hours as needed  for mild pain.   arformoterol 15 MCG/2ML  [place] Commonly known as:  BROVANA Take 2 mLs (15 mcg total) by nebulization 2 (two) times daily.   cyanocobalamin 500 MCG tablet Take 1 tablet (500 mcg total) by mouth daily.   gabapentin 800 MG tablet Commonly known as:  NEURONTIN TAKE (1) TABLET THREE TIMES DAILY.   HYDROcodone-acetaminophen 5-325 MG tablet Commonly known as:  NORCO/VICODIN Take 1 tablet by mouth every 12 (twelve) hours as needed for moderate pain.   levETIRAcetam 500 MG tablet Commonly known as:  KEPPRA Take 1 tablet (500 mg total) by mouth 2 (two) times daily.   LORazepam 0.5 MG tablet Commonly known as:  ATIVAN Take 0.5 tablets (0.25 mg total) by mouth every 12 (twelve) hours as needed for anxiety.   montelukast 10 MG tablet Commonly known as:  SINGULAIR Take 1 tablet (10 mg total) by mouth at bedtime.   nitrofurantoin 50 MG capsule Commonly known as:  MACRODANTIN Take 1 capsule (50 mg total) by mouth at bedtime.   ondansetron 4 MG disintegrating tablet Commonly known as:  ZOFRAN ODT Take 1 tablet (4 mg total) by mouth every 8 (eight) hours as needed for nausea or vomiting.   oxybutynin 5 MG tablet Commonly known as:  DITROPAN TAKE (1) TABLET THREE TIMES DAILY.   PROCARE ADULT BRIEFS MEDIUM Misc Use as needed for incontinence   sertraline 50 MG tablet Commonly known as:  ZOLOFT Take 1 tablet (50 mg total) by mouth daily.   VITAMIN D3 PO Take 1 tablet by mouth 2 (two) times daily.       Allergies  Allergen Reactions  . Penicillins Other (See Comments)    Has patient had a PCN reaction causing immediate rash, facial/tongue/throat swelling, SOB or lightheadedness with hypotension: Unknown Has patient had a PCN reaction causing severe rash involving mucus membranes or skin necrosis: Unknown Has patient had a PCN reaction that required hospitalization: Unknown Has patient had a PCN reaction occurring within the last 10 years: No If all of the above answers are "NO", then may proceed with  Cephalosporin use.     Consultations:  none   Procedures/Studies: Ct Abdomen Pelvis Wo Contrast  Result Date: 08/23/2017 CLINICAL DATA:  84 year old female with history of diarrhea, nausea, vomiting and abdominal pain. EXAM: CT ABDOMEN AND PELVIS WITHOUT CONTRAST TECHNIQUE: Multidetector CT imaging of the abdomen and pelvis was performed following the standard protocol without IV contrast. COMPARISON:  CT the abdomen and pelvis 10/21/2012. FINDINGS: Lower chest: Atherosclerotic calcifications in the left circumflex and right coronary arteries. Small hiatal hernia. Hepatobiliary: 14 mm low-attenuation lesion in the central aspect of segment 8 of the liver, incompletely characterized on today's noncontrast CT examination, but similar to the prior study, statistically likely to represent a cyst. No other suspicious appearing hepatic lesions are noted on today's noncontrast CT examination. Gallbladder is unremarkable in appearance. Pancreas: No definite pancreatic mass or peripancreatic fluid or inflammatory changes are noted on today's noncontrast CT examination. Spleen: Unremarkable. Adrenals/Urinary Tract: No calcifications are noted within the collecting system of either kidney, along the course of either ureter, or within the lumen of the urinary bladder. No hydroureteronephrosis. Exophytic 1.5 cm high attenuation lesion extending from the lower pole of the left kidney, incompletely characterized on today's noncontrast CT examination, but statistically likely a proteinaceous/hemorrhagic cysts. Unenhanced appearance of the urinary bladder is normal. Bilateral adrenal glands are normal in appearance. Stomach/Bowel: Unenhanced appearance of the stomach is normal. No pathologic dilatation of small bowel  or colon. A few scattered colonic diverticulae are noted, without surrounding inflammatory changes to suggest an acute diverticulitis at this time. Normal appendix. Vascular/Lymphatic: Aortic atherosclerosis  without definite aneurysm in the abdominal or pelvic vasculature. No lymphadenopathy noted in the abdomen or pelvis. Reproductive: Uterus and ovaries are atrophic. Other: No significant volume of ascites.  No pneumoperitoneum. Musculoskeletal: There are no aggressive appearing lytic or blastic lesions noted in the visualized portions of the skeleton. IMPRESSION: 1. No acute findings are noted in the abdomen or pelvis to account for the patient's symptoms. 2. Colonic diverticulosis without evidence of acute diverticulitis at this time. 3. Aortic atherosclerosis, in addition to at least 2 vessel coronary artery disease. 4. Small hiatal hernia. 5. Additional incidental findings, as above. Aortic Atherosclerosis (ICD10-I70.0). Electronically Signed   By: Trudie Reed M.D.   On: 08/23/2017 14:48   Dg Chest 2 View  Result Date: 08/23/2017 CLINICAL DATA:  Hypertension with nausea and vomiting EXAM: CHEST  2 VIEW COMPARISON:  July 16, 2016. FINDINGS: There is no edema or consolidation. Heart size and pulmonary vascularity are normal. No adenopathy. There is aortic atherosclerosis. There is evidence of a prior rib fracture involving the posterior right fifth rib. There is degenerative change in both shoulders. IMPRESSION: No edema or consolidation. Heart size normal. There is aortic atherosclerosis. Aortic Atherosclerosis (ICD10-I70.0). Electronically Signed   By: Bretta Bang III M.D.   On: 08/23/2017 15:03   Ct Head Wo Contrast  Result Date: 08/23/2017 CLINICAL DATA:  Altered mental status EXAM: CT HEAD WITHOUT CONTRAST TECHNIQUE: Contiguous axial images were obtained from the base of the skull through the vertex without intravenous contrast. COMPARISON:  July 12, 2013 FINDINGS: Brain: There is moderate diffuse atrophy. There is a calcified meningioma arising from the left tentorium extending to the midline measuring 2.2 x 1.0 x 0.8 cm. There is no surrounding edema, and there is no appreciable mass  effect from this calcified meningioma. No other mass evident. There is no hemorrhage, extra-axial fluid collection, or midline shift. There is patchy small vessel disease throughout the centra semiovale bilaterally. Elsewhere gray-white compartments appear normal. No acute infarct evident. Vascular: There is no appreciable hyperdense vessel. There is no appreciable vascular calcification. Skull: Bony calvarium appears intact. Sinuses/Orbits: There is mild mucosal thickening in several ethmoid air cells. Paranasal sinuses otherwise are clear. Orbits appear symmetric bilaterally. Other: Visualized mastoid air cells are clear. IMPRESSION: Atrophy with patchy supratentorial small vessel disease. No acute infarct. No hemorrhage. Benign calcified meningioma arising from the left tentorium extending to the midline, stable. No other mass. There is mild mucosal thickening in several ethmoid air cells. Electronically Signed   By: Bretta Bang III M.D.   On: 08/23/2017 14:43         Discharge Exam: Vitals:   08/24/17 2100 08/25/17 0500  BP: (!) 161/58 (!) 151/87  Pulse: (!) 57 63  Resp: 18 18  Temp: 97.6 F (36.4 C) (!) 97.4 F (36.3 C)  SpO2: 98% 97%   Vitals:   08/24/17 1509 08/24/17 2027 08/24/17 2100 08/25/17 0500  BP: (!) 142/62  (!) 161/58 (!) 151/87  Pulse: 65  (!) 57 63  Resp: 18  18 18   Temp: 98.3 F (36.8 C)  97.6 F (36.4 C) (!) 97.4 F (36.3 C)  TempSrc: Oral  Oral Oral  SpO2: 99% 96% 98% 97%  Weight:    55.7 kg (122 lb 12.7 oz)  Height:        General: Pt is  alert, awake, not in acute distress Cardiovascular: RRR, S1/S2 +, no rubs, no gallops Respiratory: CTA bilaterally, no wheezing, no rhonchi Abdominal: Soft, NT, ND, bowel sounds + Extremities: no edema, no cyanosis   The results of significant diagnostics from this hospitalization (including imaging, microbiology, ancillary and laboratory) are listed below for reference.    Significant Diagnostic Studies: Ct  Abdomen Pelvis Wo Contrast  Result Date: 08/23/2017 CLINICAL DATA:  84 year old female with history of diarrhea, nausea, vomiting and abdominal pain. EXAM: CT ABDOMEN AND PELVIS WITHOUT CONTRAST TECHNIQUE: Multidetector CT imaging of the abdomen and pelvis was performed following the standard protocol without IV contrast. COMPARISON:  CT the abdomen and pelvis 10/21/2012. FINDINGS: Lower chest: Atherosclerotic calcifications in the left circumflex and right coronary arteries. Small hiatal hernia. Hepatobiliary: 14 mm low-attenuation lesion in the central aspect of segment 8 of the liver, incompletely characterized on today's noncontrast CT examination, but similar to the prior study, statistically likely to represent a cyst. No other suspicious appearing hepatic lesions are noted on today's noncontrast CT examination. Gallbladder is unremarkable in appearance. Pancreas: No definite pancreatic mass or peripancreatic fluid or inflammatory changes are noted on today's noncontrast CT examination. Spleen: Unremarkable. Adrenals/Urinary Tract: No calcifications are noted within the collecting system of either kidney, along the course of either ureter, or within the lumen of the urinary bladder. No hydroureteronephrosis. Exophytic 1.5 cm high attenuation lesion extending from the lower pole of the left kidney, incompletely characterized on today's noncontrast CT examination, but statistically likely a proteinaceous/hemorrhagic cysts. Unenhanced appearance of the urinary bladder is normal. Bilateral adrenal glands are normal in appearance. Stomach/Bowel: Unenhanced appearance of the stomach is normal. No pathologic dilatation of small bowel or colon. A few scattered colonic diverticulae are noted, without surrounding inflammatory changes to suggest an acute diverticulitis at this time. Normal appendix. Vascular/Lymphatic: Aortic atherosclerosis without definite aneurysm in the abdominal or pelvic vasculature. No  lymphadenopathy noted in the abdomen or pelvis. Reproductive: Uterus and ovaries are atrophic. Other: No significant volume of ascites.  No pneumoperitoneum. Musculoskeletal: There are no aggressive appearing lytic or blastic lesions noted in the visualized portions of the skeleton. IMPRESSION: 1. No acute findings are noted in the abdomen or pelvis to account for the patient's symptoms. 2. Colonic diverticulosis without evidence of acute diverticulitis at this time. 3. Aortic atherosclerosis, in addition to at least 2 vessel coronary artery disease. 4. Small hiatal hernia. 5. Additional incidental findings, as above. Aortic Atherosclerosis (ICD10-I70.0). Electronically Signed   By: Trudie Reedaniel  Entrikin M.D.   On: 08/23/2017 14:48   Dg Chest 2 View  Result Date: 08/23/2017 CLINICAL DATA:  Hypertension with nausea and vomiting EXAM: CHEST  2 VIEW COMPARISON:  July 16, 2016. FINDINGS: There is no edema or consolidation. Heart size and pulmonary vascularity are normal. No adenopathy. There is aortic atherosclerosis. There is evidence of a prior rib fracture involving the posterior right fifth rib. There is degenerative change in both shoulders. IMPRESSION: No edema or consolidation. Heart size normal. There is aortic atherosclerosis. Aortic Atherosclerosis (ICD10-I70.0). Electronically Signed   By: Bretta BangWilliam  Woodruff III M.D.   On: 08/23/2017 15:03   Ct Head Wo Contrast  Result Date: 08/23/2017 CLINICAL DATA:  Altered mental status EXAM: CT HEAD WITHOUT CONTRAST TECHNIQUE: Contiguous axial images were obtained from the base of the skull through the vertex without intravenous contrast. COMPARISON:  July 12, 2013 FINDINGS: Brain: There is moderate diffuse atrophy. There is a calcified meningioma arising from the left tentorium extending to the  midline measuring 2.2 x 1.0 x 0.8 cm. There is no surrounding edema, and there is no appreciable mass effect from this calcified meningioma. No other mass evident. There  is no hemorrhage, extra-axial fluid collection, or midline shift. There is patchy small vessel disease throughout the centra semiovale bilaterally. Elsewhere gray-white compartments appear normal. No acute infarct evident. Vascular: There is no appreciable hyperdense vessel. There is no appreciable vascular calcification. Skull: Bony calvarium appears intact. Sinuses/Orbits: There is mild mucosal thickening in several ethmoid air cells. Paranasal sinuses otherwise are clear. Orbits appear symmetric bilaterally. Other: Visualized mastoid air cells are clear. IMPRESSION: Atrophy with patchy supratentorial small vessel disease. No acute infarct. No hemorrhage. Benign calcified meningioma arising from the left tentorium extending to the midline, stable. No other mass. There is mild mucosal thickening in several ethmoid air cells. Electronically Signed   By: Bretta Bang III M.D.   On: 08/23/2017 14:43     Microbiology: No results found for this or any previous visit (from the past 240 hour(s)).   Labs: Basic Metabolic Panel: Recent Labs  Lab 08/23/17 1353 08/24/17 0553 08/24/17 0720  NA 142 140  --   K 3.3* 3.8  --   CL 104 107  --   CO2 24 22  --   GLUCOSE 103* 80  --   BUN 17 17  --   CREATININE 1.13* 1.01*  --   CALCIUM 9.6 9.0  --   MG  --   --  1.7   Liver Function Tests: Recent Labs  Lab 08/23/17 1353  AST 22  ALT 15  ALKPHOS 76  BILITOT 0.9  PROT 7.2  ALBUMIN 4.3   Recent Labs  Lab 08/23/17 1353  LIPASE 23   No results for input(s): AMMONIA in the last 168 hours. CBC: Recent Labs  Lab 08/23/17 1353  WBC 8.2  NEUTROABS 6.8  HGB 13.4  HCT 40.0  MCV 86.8  PLT 218   Cardiac Enzymes: Recent Labs  Lab 08/23/17 1353  TROPONINI <0.03   BNP: Invalid input(s): POCBNP CBG: No results for input(s): GLUCAP in the last 168 hours.  Time coordinating discharge:  Greater than 30 minutes  Signed:  Catarina Hartshorn, DO [facility] Pager: [phone] 08/25/2017,  8:53 AM

## 2017-08-29 ENCOUNTER — Telehealth: Payer: Self-pay | Admitting: Family

## 2017-08-29 ENCOUNTER — Other Ambulatory Visit: Payer: Self-pay | Admitting: Family

## 2017-08-29 DIAGNOSIS — G6289 Other specified polyneuropathies: Secondary | ICD-10-CM

## 2017-08-29 NOTE — Telephone Encounter (Signed)
Daughter is calling wanting to speak with with [name] herself. Aware she will not be back till Thursday.

## 2017-08-29 NOTE — Telephone Encounter (Signed)
Just FYI for provider to address upon return to work.

## 2017-08-30 ENCOUNTER — Ambulatory Visit (INDEPENDENT_AMBULATORY_CARE_PROVIDER_SITE_OTHER): Payer: Medicare Other

## 2017-08-30 DIAGNOSIS — R296 Repeated falls: Secondary | ICD-10-CM

## 2017-08-30 DIAGNOSIS — M48062 Spinal stenosis, lumbar region with neurogenic claudication: Secondary | ICD-10-CM

## 2017-08-30 DIAGNOSIS — G894 Chronic pain syndrome: Secondary | ICD-10-CM | POA: Diagnosis not present

## 2017-08-30 DIAGNOSIS — I10 Essential (primary) hypertension: Secondary | ICD-10-CM | POA: Diagnosis not present

## 2017-09-04 ENCOUNTER — Telehealth: Payer: Self-pay | Admitting: Family

## 2017-09-04 NOTE — Telephone Encounter (Signed)
PT will need appt per insurance

## 2017-09-04 NOTE — Telephone Encounter (Signed)
Daughter aware that patient will need to be seen.  Declined appt at this time

## 2017-09-08 ENCOUNTER — Other Ambulatory Visit: Payer: Self-pay | Admitting: Family

## 2017-09-08 DIAGNOSIS — G6289 Other specified polyneuropathies: Secondary | ICD-10-CM

## 2017-09-08 NOTE — Telephone Encounter (Signed)
Seen 08/15/17  Chrsity

## 2017-09-11 ENCOUNTER — Other Ambulatory Visit: Payer: Self-pay | Admitting: Family

## 2017-09-14 ENCOUNTER — Encounter: Payer: Self-pay | Admitting: Family

## 2017-09-14 ENCOUNTER — Ambulatory Visit (INDEPENDENT_AMBULATORY_CARE_PROVIDER_SITE_OTHER): Payer: Medicare Other | Admitting: Family

## 2017-09-14 VITALS — BP 157/81 | HR 66 | Temp 97.0°F

## 2017-09-14 DIAGNOSIS — M48062 Spinal stenosis, lumbar region with neurogenic claudication: Secondary | ICD-10-CM

## 2017-09-14 DIAGNOSIS — G6289 Other specified polyneuropathies: Secondary | ICD-10-CM

## 2017-09-14 DIAGNOSIS — G894 Chronic pain syndrome: Secondary | ICD-10-CM | POA: Diagnosis not present

## 2017-09-14 NOTE — Patient Instructions (Signed)

## 2017-09-14 NOTE — Progress Notes (Signed)
   Subjective:    Patient ID: [name], female    DOB: 22-Mar-1925, [age] y.o.   MRN: [id number]  HPI Pt presents to the office today for face to face visit for a prescription for hospital bed. Daughter brought patient into today and reports she has "good and bad" days.   Daughter reports some days she has to stay in the bed all day and this is hard to feed, bathe, and change her in her current bed.   Pt has chronic pain  From her peripheral neuropathy and chronic back pain that interferes with her ability to sit and lay in certain positions. A hospital bed would help alleviated her pain by allowing her head to be raised and to be positioned in ways not feasible with a normal bed. Pain is constant and requires frequent and immediated changes in body positions which cannot be achieved with a normal bed.   She would also benefit from a bedside table that could be raised while feeding her.   Review of Systems  Musculoskeletal: Positive for arthralgias and back pain.  All other systems reviewed and are negative.      Objective:   Physical Exam  Constitutional: She is oriented to person, place, and time. She appears well-developed and well-nourished. No distress.  HENT:  Head: Normocephalic and atraumatic.  Eyes: Pupils are equal, round, and reactive to light.  Neck: No thyromegaly present.  Cardiovascular: Normal rate, regular rhythm and intact distal pulses.  Murmur heard. Pulmonary/Chest: Effort normal and breath sounds normal. No respiratory distress. She has no wheezes.  Abdominal: Soft. Bowel sounds are normal. She exhibits no distension. There is no tenderness.  Musculoskeletal: She exhibits no edema or tenderness.  Generalized weakness, in wheelchair   Neurological: She is alert and oriented to person, place, and time.  Skin: Skin is warm and dry.  Psychiatric: She has a normal mood and affect. Her behavior is normal. Judgment and thought content normal.  Vitals  reviewed.     BP (!) 157/81   Pulse 66   Temp (!) 97 F (36.1 C) (Oral)      Assessment & Plan:  1. Chronic pain syndrome  2. Spinal stenosis, lumbar region, with neurogenic claudication  3. Other polyneuropathy   Script written for hospital bed and bedside table Keep chronic follow up appts Pain medications as needed   Jannifer Rodneyhristy Zhania Shaheen, [name]

## 2017-10-23 ENCOUNTER — Other Ambulatory Visit: Payer: Self-pay | Admitting: Family

## 2017-10-23 NOTE — Telephone Encounter (Signed)
Last seen 09/14/17

## 2017-11-23 ENCOUNTER — Other Ambulatory Visit: Payer: Self-pay | Admitting: Family

## 2017-11-23 DIAGNOSIS — F411 Generalized anxiety disorder: Secondary | ICD-10-CM

## 2017-11-23 MED ORDER — LORAZEPAM 0.5 MG PO TABS: 0.2500 mg | ORAL_TABLET | Freq: Two times a day (BID) | ORAL | 2 refills | Status: DC | PRN

## 2017-11-28 ENCOUNTER — Other Ambulatory Visit: Payer: Self-pay | Admitting: Family

## 2017-11-28 DIAGNOSIS — F411 Generalized anxiety disorder: Secondary | ICD-10-CM

## 2017-11-28 MED ORDER — LORAZEPAM 0.5 MG PO TABS: 0.2500 mg | ORAL_TABLET | Freq: Two times a day (BID) | ORAL | 3 refills | Status: AC | PRN

## 2017-11-29 ENCOUNTER — Ambulatory Visit (INDEPENDENT_AMBULATORY_CARE_PROVIDER_SITE_OTHER): Payer: Medicare Other

## 2017-11-29 DIAGNOSIS — M48062 Spinal stenosis, lumbar region with neurogenic claudication: Secondary | ICD-10-CM | POA: Diagnosis not present

## 2017-11-29 DIAGNOSIS — R296 Repeated falls: Secondary | ICD-10-CM

## 2017-11-29 DIAGNOSIS — I10 Essential (primary) hypertension: Secondary | ICD-10-CM

## 2017-11-29 DIAGNOSIS — G894 Chronic pain syndrome: Secondary | ICD-10-CM

## 2017-12-06 ENCOUNTER — Other Ambulatory Visit: Payer: Self-pay | Admitting: *Deleted

## 2017-12-06 DIAGNOSIS — Z0289 Encounter for other administrative examinations: Secondary | ICD-10-CM

## 2017-12-06 DIAGNOSIS — G6289 Other specified polyneuropathies: Secondary | ICD-10-CM

## 2017-12-06 DIAGNOSIS — G894 Chronic pain syndrome: Secondary | ICD-10-CM

## 2017-12-07 ENCOUNTER — Telehealth: Payer: Self-pay | Admitting: *Deleted

## 2017-12-07 DIAGNOSIS — Z0289 Encounter for other administrative examinations: Secondary | ICD-10-CM

## 2017-12-07 DIAGNOSIS — G894 Chronic pain syndrome: Secondary | ICD-10-CM

## 2017-12-07 DIAGNOSIS — G6289 Other specified polyneuropathies: Secondary | ICD-10-CM

## 2017-12-07 MED ORDER — HYDROCODONE-ACETAMINOPHEN 5-325 MG PO TABS: 1.0000 | ORAL_TABLET | Freq: Two times a day (BID) | ORAL | 0 refills | Status: DC | PRN

## 2017-12-07 MED ORDER — HYDROCODONE-ACETAMINOPHEN 5-325 MG PO TABS
1.0000 | ORAL_TABLET | Freq: Two times a day (BID) | ORAL | 0 refills | Status: AC | PRN
Start: 2017-12-07 — End: ?

## 2017-12-07 NOTE — Telephone Encounter (Signed)
Prescription sent to pharmacy.

## 2017-12-07 NOTE — Telephone Encounter (Signed)
Please fix Hydrocodone RX

## 2017-12-29 ENCOUNTER — Other Ambulatory Visit: Payer: Self-pay | Admitting: Family

## 2017-12-29 DIAGNOSIS — F339 Major depressive disorder, recurrent, unspecified: Secondary | ICD-10-CM

## 2017-12-29 DIAGNOSIS — G6289 Other specified polyneuropathies: Secondary | ICD-10-CM

## 2017-12-29 DIAGNOSIS — N3281 Overactive bladder: Secondary | ICD-10-CM

## 2017-12-29 DIAGNOSIS — F411 Generalized anxiety disorder: Secondary | ICD-10-CM

## 2018-01-29 ENCOUNTER — Other Ambulatory Visit: Payer: Self-pay | Admitting: Family

## 2018-01-29 DIAGNOSIS — G6289 Other specified polyneuropathies: Secondary | ICD-10-CM

## 2018-01-30 NOTE — Telephone Encounter (Signed)
Last seen 09/14/17

## 2018-02-06 ENCOUNTER — Telehealth: Payer: Self-pay | Admitting: *Deleted

## 2018-02-06 NOTE — Telephone Encounter (Signed)
Ok, I hate to hear this. Please let me know if you need anything.

## 2018-02-06 NOTE — Telephone Encounter (Signed)
Patient was admitted to hospice today and they are going to change her lorazepam to liquid and also place her on morphine

## 2018-02-09 ENCOUNTER — Other Ambulatory Visit: Payer: Self-pay | Admitting: Family

## 2018-02-09 NOTE — Progress Notes (Unsigned)
Significantly declined

## 2018-02-13 ENCOUNTER — Ambulatory Visit (INDEPENDENT_AMBULATORY_CARE_PROVIDER_SITE_OTHER)

## 2018-02-13 DIAGNOSIS — E46 Unspecified protein-calorie malnutrition: Secondary | ICD-10-CM | POA: Diagnosis not present

## 2018-02-13 DIAGNOSIS — G894 Chronic pain syndrome: Secondary | ICD-10-CM

## 2018-02-13 DIAGNOSIS — M4800 Spinal stenosis, site unspecified: Secondary | ICD-10-CM

## 2018-02-13 DIAGNOSIS — G629 Polyneuropathy, unspecified: Secondary | ICD-10-CM

## 2018-02-13 DIAGNOSIS — F411 Generalized anxiety disorder: Secondary | ICD-10-CM

## 2018-02-13 DIAGNOSIS — N3281 Overactive bladder: Secondary | ICD-10-CM

## 2018-02-13 DIAGNOSIS — G311 Senile degeneration of brain, not elsewhere classified: Secondary | ICD-10-CM | POA: Diagnosis not present

## 2018-02-13 DIAGNOSIS — I10 Essential (primary) hypertension: Secondary | ICD-10-CM

## 2018-02-13 DIAGNOSIS — J45909 Unspecified asthma, uncomplicated: Secondary | ICD-10-CM

## 2018-02-25 DEATH — deceased

## 2018-11-13 IMAGING — CT CT HEAD W/O CM
3 of 6 series · 14 of 47 positions shown, 17 images · non-contrast
Comparison: July 12, 2013

CLINICAL DATA: Altered mental status

EXAM:
CT HEAD WITHOUT CONTRAST
TECHNIQUE: Contiguous axial images were obtained from the base of the skull
through the vertex without intravenous contrast.

[Series 2: head wo · axial · 0.39mm/px · z∈[+47,+167]mm · 9 of 30 slices shown, 12 images]
[im 4/30  brain]
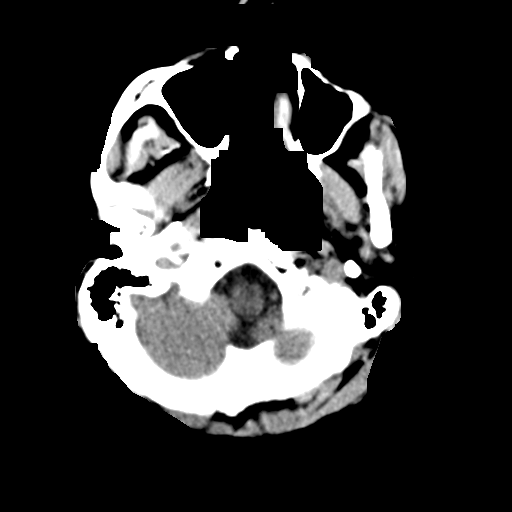
[im 4/30  bone]
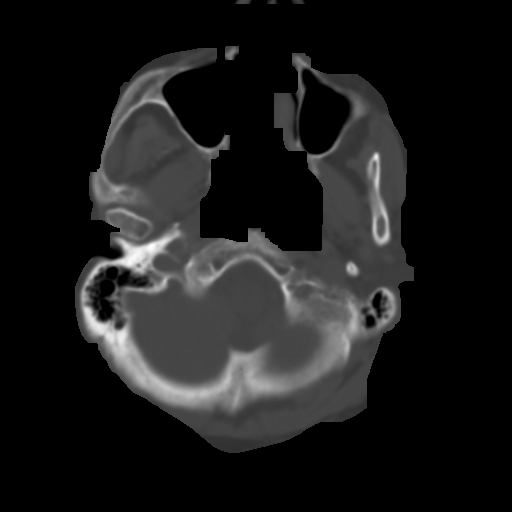
[im 7/30  brain]
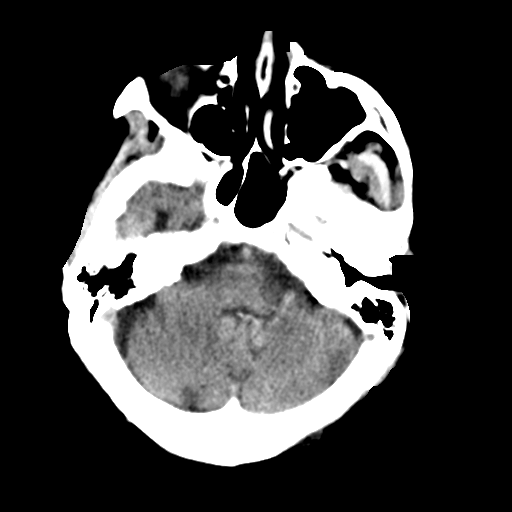
[im 10/30  brain]
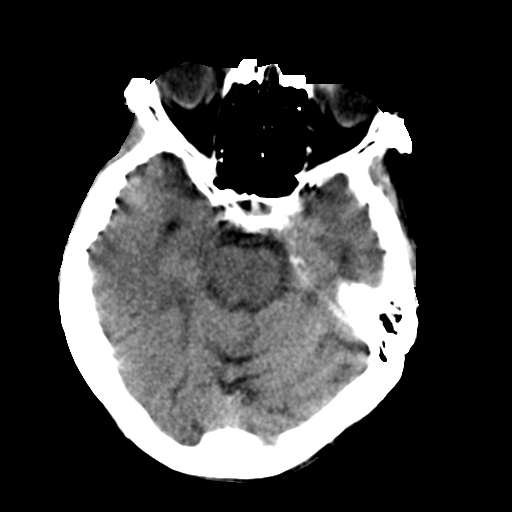
[im 13/30  brain]
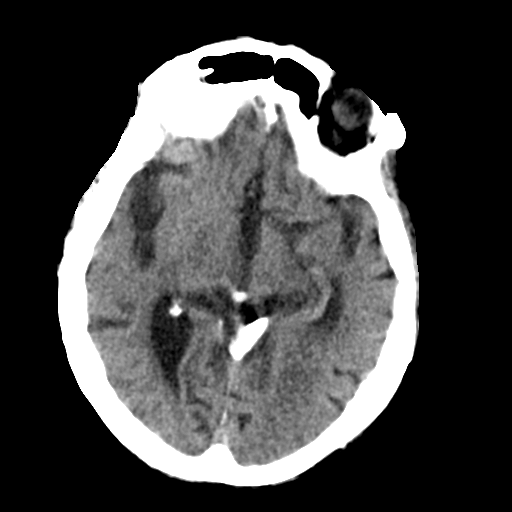
[im 16/30  brain]
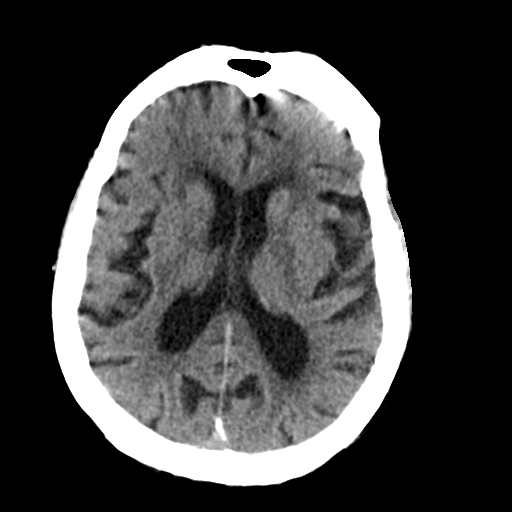
[im 16/30  bone]
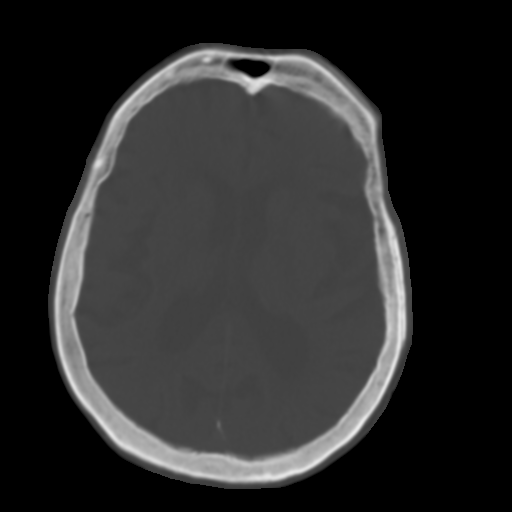
[im 19/30  brain]
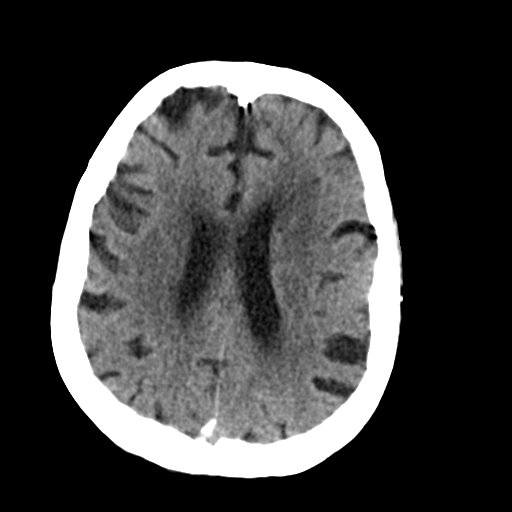
[im 22/30  brain]
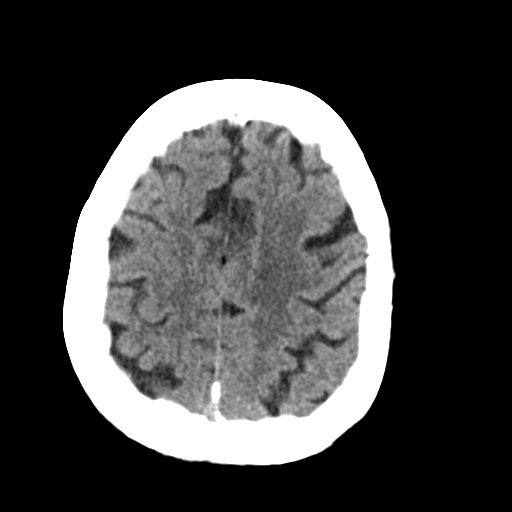
[im 25/30  brain]
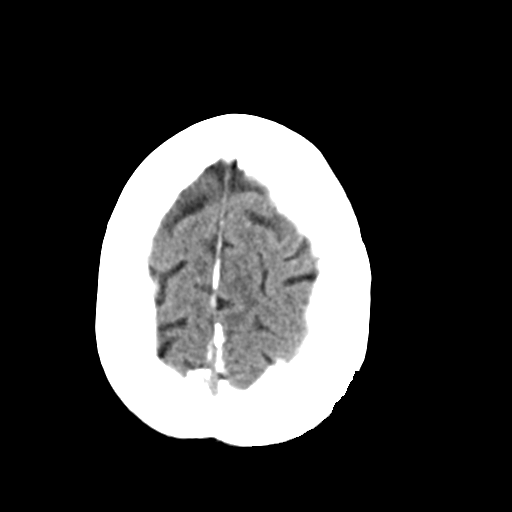
[im 28/30  brain]
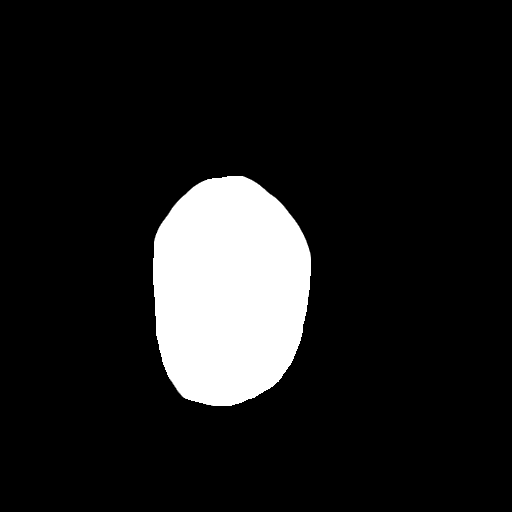
[im 28/30  bone]
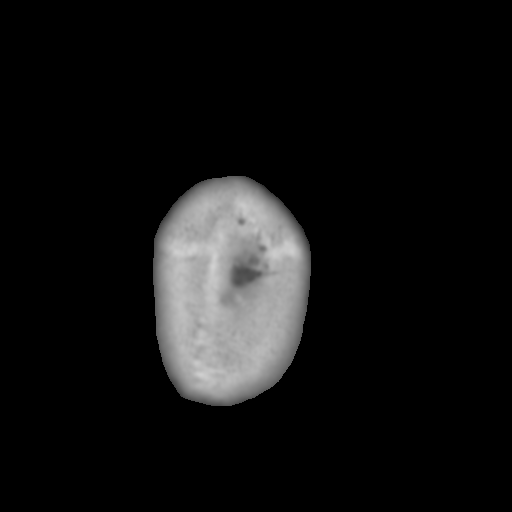

[Series 6: coronal soft tissue · coronal · 0.30mm/px · 3 of 65 slices shown]
[im 17/65  brain]
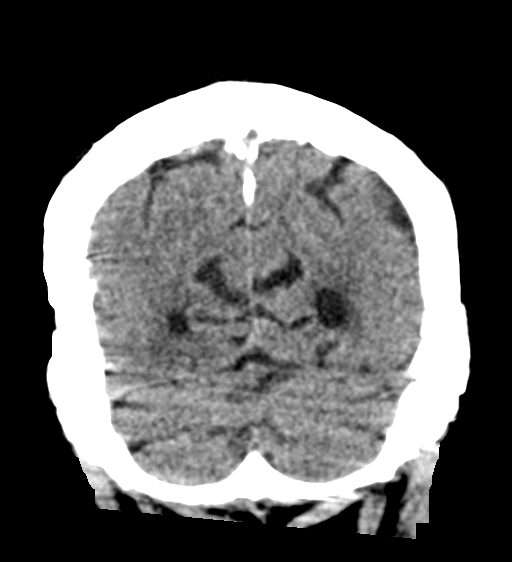
[im 33/65  brain]
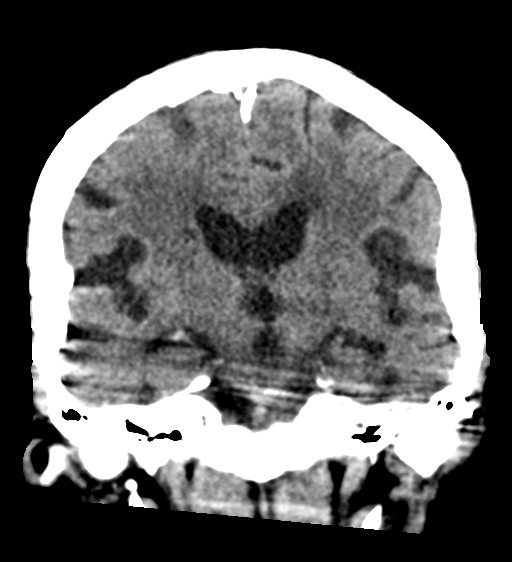
[im 49/65  brain]
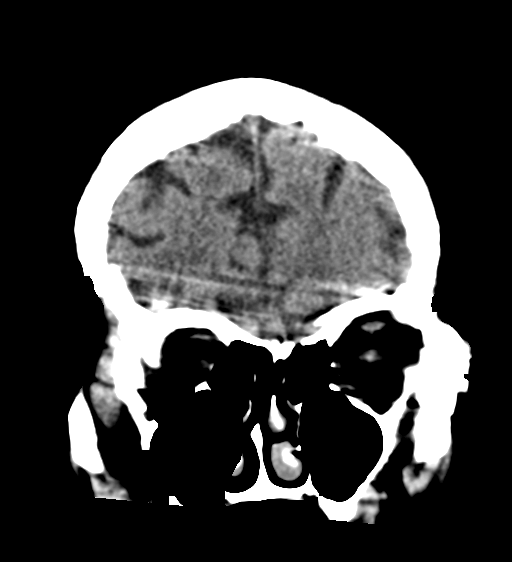

[Series 9: sagittal soft tissue · sagittal · 0.20mm/px · 2 of 53 slices shown]
[im 18/53  brain]
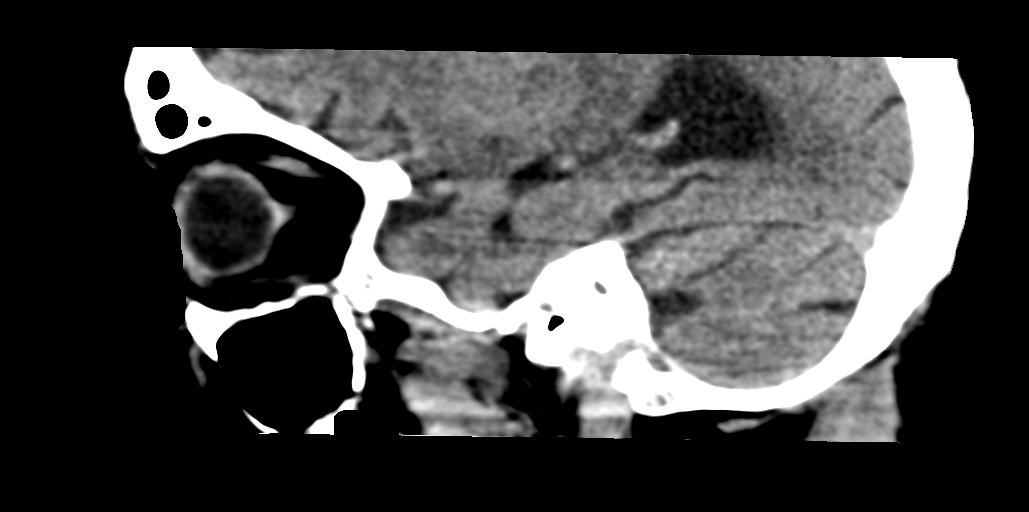
[im 35/53  brain]
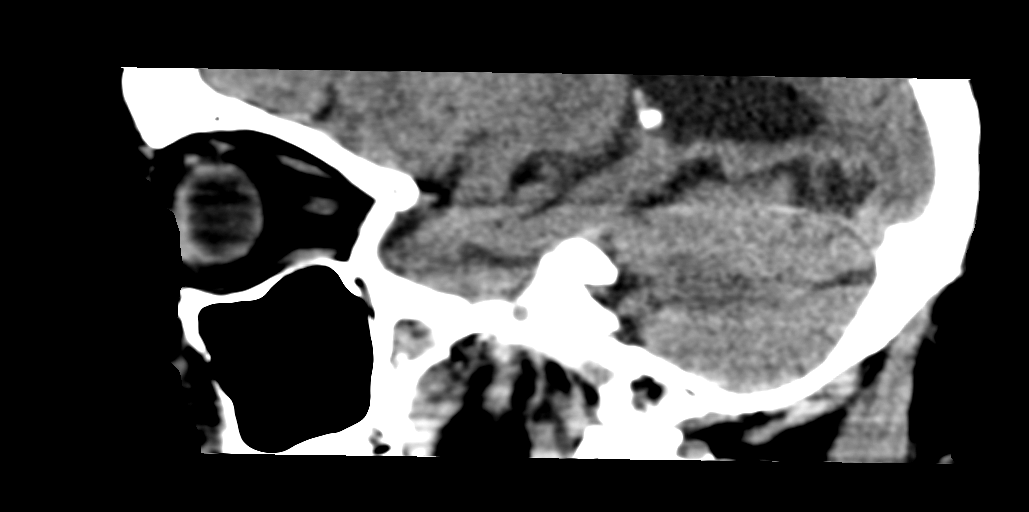

[14 of 47 positions shown; findings below may reference images not displayed]

FINDINGS: Brain: There is moderate diffuse atrophy. There is a calcified
meningioma arising from the left tentorium extending to the midline
measuring 2.2 x 1.0 x 0.8 cm. There is no surrounding edema, and
there is no appreciable mass effect from this calcified meningioma.
No other mass evident. There is no hemorrhage, extra-axial fluid
collection, or midline shift. There is patchy small vessel disease
throughout the centra semiovale bilaterally. Elsewhere gray-white
compartments appear normal. No acute infarct evident.

Vascular: There is no appreciable hyperdense vessel. There is no
appreciable vascular calcification.

Skull: Bony calvarium appears intact.

Sinuses/Orbits: There is mild mucosal thickening in several ethmoid
air cells. Paranasal sinuses otherwise are clear. Orbits appear
symmetric bilaterally.

Other: Visualized mastoid air cells are clear.
IMPRESSION: Atrophy with patchy supratentorial small vessel disease. No acute
infarct. No hemorrhage. Benign calcified meningioma arising from the
left tentorium extending to the midline, stable. No other mass.

There is mild mucosal thickening in several ethmoid air cells.
